# Patient Record
Sex: Male | Born: 1950 | Race: White | Hispanic: No | Smoking: Never smoker
Health system: Southern US, Community
[De-identification: ages and names within clinical notes are randomized; demographics above are authoritative.]

## PROBLEM LIST (undated history)

## (undated) DIAGNOSIS — K219 Gastro-esophageal reflux disease without esophagitis: Secondary | ICD-10-CM

## (undated) DIAGNOSIS — E785 Hyperlipidemia, unspecified: Secondary | ICD-10-CM

## (undated) DIAGNOSIS — J441 Chronic obstructive pulmonary disease with (acute) exacerbation: Secondary | ICD-10-CM

## (undated) DIAGNOSIS — J9621 Acute and chronic respiratory failure with hypoxia: Secondary | ICD-10-CM

## (undated) DIAGNOSIS — J449 Chronic obstructive pulmonary disease, unspecified: Secondary | ICD-10-CM

## (undated) DIAGNOSIS — I10 Essential (primary) hypertension: Secondary | ICD-10-CM

## (undated) DIAGNOSIS — F329 Major depressive disorder, single episode, unspecified: Secondary | ICD-10-CM

## (undated) DIAGNOSIS — F411 Generalized anxiety disorder: Secondary | ICD-10-CM

## (undated) DIAGNOSIS — D649 Anemia, unspecified: Secondary | ICD-10-CM

## (undated) DIAGNOSIS — F419 Anxiety disorder, unspecified: Secondary | ICD-10-CM

## (undated) DIAGNOSIS — N400 Enlarged prostate without lower urinary tract symptoms: Secondary | ICD-10-CM

## (undated) DIAGNOSIS — Z9911 Dependence on respirator [ventilator] status: Secondary | ICD-10-CM

---

## 2018-11-28 ENCOUNTER — Emergency Department (HOSPITAL_COMMUNITY): Payer: Medicare Other

## 2018-11-28 ENCOUNTER — Inpatient Hospital Stay (HOSPITAL_COMMUNITY)
Admission: EM | Admit: 2018-11-28 | Discharge: 2018-12-14 | DRG: 871 | Disposition: A | Discharge status: Other Acute Inpatient Hospital | Payer: Medicare Other | Source: Skilled Nursing Facility | Attending: Internal Medicine | Admitting: Internal Medicine

## 2018-11-28 DIAGNOSIS — Z885 Allergy status to narcotic agent status: Secondary | ICD-10-CM

## 2018-11-28 DIAGNOSIS — R042 Hemoptysis: Secondary | ICD-10-CM | POA: Diagnosis present

## 2018-11-28 DIAGNOSIS — Z20828 Contact with and (suspected) exposure to other viral communicable diseases: Secondary | ICD-10-CM | POA: Diagnosis present

## 2018-11-28 DIAGNOSIS — Z930 Tracheostomy status: Secondary | ICD-10-CM

## 2018-11-28 DIAGNOSIS — Z87440 Personal history of urinary (tract) infections: Secondary | ICD-10-CM

## 2018-11-28 DIAGNOSIS — I10 Essential (primary) hypertension: Secondary | ICD-10-CM | POA: Diagnosis present

## 2018-11-28 DIAGNOSIS — F419 Anxiety disorder, unspecified: Secondary | ICD-10-CM | POA: Diagnosis present

## 2018-11-28 DIAGNOSIS — Z9911 Dependence on respirator [ventilator] status: Secondary | ICD-10-CM

## 2018-11-28 DIAGNOSIS — J9601 Acute respiratory failure with hypoxia: Secondary | ICD-10-CM | POA: Diagnosis present

## 2018-11-28 DIAGNOSIS — E872 Acidosis: Secondary | ICD-10-CM | POA: Diagnosis present

## 2018-11-28 DIAGNOSIS — R059 Cough, unspecified: Secondary | ICD-10-CM

## 2018-11-28 DIAGNOSIS — Z8619 Personal history of other infectious and parasitic diseases: Secondary | ICD-10-CM

## 2018-11-28 DIAGNOSIS — R0602 Shortness of breath: Secondary | ICD-10-CM

## 2018-11-28 DIAGNOSIS — J439 Emphysema, unspecified: Secondary | ICD-10-CM | POA: Diagnosis present

## 2018-11-28 DIAGNOSIS — J9621 Acute and chronic respiratory failure with hypoxia: Secondary | ICD-10-CM | POA: Diagnosis present

## 2018-11-28 DIAGNOSIS — A419 Sepsis, unspecified organism: Principal | ICD-10-CM

## 2018-11-28 DIAGNOSIS — N400 Enlarged prostate without lower urinary tract symptoms: Secondary | ICD-10-CM | POA: Diagnosis present

## 2018-11-28 DIAGNOSIS — J9690 Respiratory failure, unspecified, unspecified whether with hypoxia or hypercapnia: Secondary | ICD-10-CM

## 2018-11-28 DIAGNOSIS — J189 Pneumonia, unspecified organism: Secondary | ICD-10-CM

## 2018-11-28 DIAGNOSIS — E785 Hyperlipidemia, unspecified: Secondary | ICD-10-CM | POA: Diagnosis present

## 2018-11-28 DIAGNOSIS — G4700 Insomnia, unspecified: Secondary | ICD-10-CM | POA: Diagnosis not present

## 2018-11-28 DIAGNOSIS — D649 Anemia, unspecified: Secondary | ICD-10-CM | POA: Diagnosis present

## 2018-11-28 DIAGNOSIS — Z888 Allergy status to other drugs, medicaments and biological substances status: Secondary | ICD-10-CM

## 2018-11-28 DIAGNOSIS — J441 Chronic obstructive pulmonary disease with (acute) exacerbation: Secondary | ICD-10-CM

## 2018-11-28 DIAGNOSIS — F329 Major depressive disorder, single episode, unspecified: Secondary | ICD-10-CM | POA: Diagnosis present

## 2018-11-28 DIAGNOSIS — R739 Hyperglycemia, unspecified: Secondary | ICD-10-CM | POA: Diagnosis not present

## 2018-11-28 DIAGNOSIS — R05 Cough: Secondary | ICD-10-CM

## 2018-11-28 DIAGNOSIS — Z8701 Personal history of pneumonia (recurrent): Secondary | ICD-10-CM

## 2018-11-28 DIAGNOSIS — M21372 Foot drop, left foot: Secondary | ICD-10-CM

## 2018-11-28 DIAGNOSIS — L8990 Pressure ulcer of unspecified site, unspecified stage: Secondary | ICD-10-CM

## 2018-11-28 DIAGNOSIS — Z931 Gastrostomy status: Secondary | ICD-10-CM

## 2018-11-28 DIAGNOSIS — J9620 Acute and chronic respiratory failure, unspecified whether with hypoxia or hypercapnia: Secondary | ICD-10-CM

## 2018-11-28 DIAGNOSIS — K219 Gastro-esophageal reflux disease without esophagitis: Secondary | ICD-10-CM | POA: Diagnosis present

## 2018-11-28 DIAGNOSIS — Z7682 Awaiting organ transplant status: Secondary | ICD-10-CM

## 2018-11-28 DIAGNOSIS — Z79899 Other long term (current) drug therapy: Secondary | ICD-10-CM

## 2018-11-28 HISTORY — DX: Chronic obstructive pulmonary disease, unspecified: J44.9

## 2018-11-28 HISTORY — DX: Hyperlipidemia, unspecified: E78.5

## 2018-11-28 HISTORY — DX: Major depressive disorder, single episode, unspecified: F32.9

## 2018-11-28 HISTORY — DX: Dependence on respirator (ventilator) status: Z99.11

## 2018-11-28 HISTORY — DX: Essential (primary) hypertension: I10

## 2018-11-28 HISTORY — DX: Gastro-esophageal reflux disease without esophagitis: K21.9

## 2018-11-28 HISTORY — DX: Anemia, unspecified: D64.9

## 2018-11-28 HISTORY — DX: Anxiety disorder, unspecified: F41.9

## 2018-11-28 HISTORY — DX: Benign prostatic hyperplasia without lower urinary tract symptoms: N40.0

## 2018-11-28 MED ORDER — METHYLPREDNISOLONE SODIUM SUCC 125 MG IJ SOLR
125.0000 mg | Freq: Once | INTRAMUSCULAR | Status: AC
  Administered 2018-11-29: 125 mg via INTRAVENOUS
  Filled 2018-11-28: qty 2

## 2018-11-28 MED ORDER — NITROGLYCERIN 0.4 MG SL SUBL
0.4000 mg | SUBLINGUAL_TABLET | SUBLINGUAL | Status: DC | PRN
  Administered 2018-12-12: 0.4 mg via SUBLINGUAL
  Filled 2018-11-28 (×2): qty 1

## 2018-11-28 MED ORDER — ALBUTEROL (5 MG/ML) CONTINUOUS INHALATION SOLN
10.0000 mg/h | INHALATION_SOLUTION | RESPIRATORY_TRACT | Status: DC
  Administered 2018-11-29: 10 mg/h via RESPIRATORY_TRACT
  Filled 2018-11-28 (×2): qty 20

## 2018-11-28 MED ORDER — IPRATROPIUM BROMIDE 0.02 % IN SOLN
0.5000 mg | Freq: Once | RESPIRATORY_TRACT | Status: AC
  Administered 2018-11-29: 0.5 mg via RESPIRATORY_TRACT
  Filled 2018-11-28: qty 2.5

## 2018-11-28 MED ORDER — FUROSEMIDE 10 MG/ML IJ SOLN
20.0000 mg | Freq: Once | INTRAMUSCULAR | Status: AC
  Administered 2018-11-29: 20 mg via INTRAVENOUS
  Filled 2018-11-28: qty 2

## 2018-11-28 MED ORDER — MAGNESIUM SULFATE 2 GM/50ML IV SOLN
2.0000 g | Freq: Once | INTRAVENOUS | Status: AC
  Administered 2018-11-29: 2 g via INTRAVENOUS
  Filled 2018-11-28: qty 50

## 2018-11-28 NOTE — ED Provider Notes (Signed)
[facility] EMERGENCY DEPARTMENT Provider Note  CSN: [id number] Arrival date & time: 11/28/18 2335  Chief Complaint(s) Respiratory Distress  HPI Robert Pham is a 68 y.o. male with extensive past medical history including COPD who is currently vent dependent through a trach, currently living at [facility] and currently being considered for lung transplant by [facility] who presents with respiratory distress and hemoptysis.  Shortness of breath began earlier today and gradually worsened throughout the day.  Patient began having frothy red sputum.  Patient also noted to be tachycardic.  Patient complains of shortness of breath.  He is also noted to have bilateral lower extremity edema which he reports is been there for 2 weeks.  He denies any history of heart failure.  On review of records from outside hospital notes, it appears that patient was treated for pseudomonal pneumonia back in March.  Patient also noted to have Candida urinary tract infection that was treated with fluconazole.  He was discharged from outside hospital and sent to [facility] 2 days ago.   Remainder of history, ROS, and physical exam limited due to patient's condition (RESPIRATORY DISTRESS AND ACUITY). Additional information was obtained from EMS, PT, AND OSH RECORDS.   Level V Caveat.    HPI  Past Medical History Past Medical History:  Diagnosis Date  . Anemia   . Anxiety   . BPH (benign prostatic hyperplasia)   . COPD (chronic obstructive pulmonary disease) (HCC)   . GERD (gastroesophageal reflux disease)   . Hyperlipidemia   . Hypertension   . Major depressive disorder   . Ventilator dependent [facility])    Patient Active Problem List   Diagnosis Date Noted  . Acute respiratory failure with hypoxia (HCC) 11/29/2018   Home Medication(s) Prior to Admission medications   Not on File                      Past Surgical History History reviewed. No pertinent surgical history. Family History No family history on file.  Social History Social History   Tobacco Use  . Smoking status: Never Smoker  . Smokeless tobacco: Never Used  Substance Use Topics  . Alcohol use: Not Currently  . Drug use: Not Currently   Allergies Codeine and Klonopin [clonazepam]  Review of Systems Review of Systems  Unable to perform ROS: Acuity of condition    Physical Exam Vital Signs  I have reviewed the triage vital signs BP (!) 125/95   Pulse (!) 126   Temp (!) 96.6 F (35.9 C) (Temporal)   Resp (!) 21   Ht 5\' 5"  (1.651 m)   SpO2 100%   Physical Exam Vitals signs reviewed.  Constitutional:      General: He is in acute distress.     Appearance: He is well-developed. He is not diaphoretic.  HENT:     Head: Normocephalic and atraumatic.      Nose: Nose normal.  Eyes:     General: No scleral icterus.       Right eye: No discharge.        Left eye: No discharge.     Conjunctiva/sclera: Conjunctivae normal.     Pupils: Pupils are equal, round, and reactive to light.  Neck:     Musculoskeletal: Normal range of motion and neck supple.  Cardiovascular:     Rate and Rhythm: Regular rhythm. Tachycardia present.     Heart sounds: No murmur. No friction rub. No gallop.   Pulmonary:  Effort: Tachypnea, accessory muscle usage, respiratory distress and retractions present.     Breath sounds: Decreased air movement present. No stridor. Examination of the right-middle field reveals rales. Examination of the left-middle field reveals rales. Examination of the right-lower field reveals rales. Examination of the left-lower field reveals rales. Wheezing (faint insp and exp) and rales present.  Abdominal:     General: There is no distension.     Palpations: Abdomen is soft.     Tenderness: There is no abdominal tenderness.  Musculoskeletal:        General: No tenderness.     Right  lower leg: 1+ Pitting Edema present.     Left lower leg: 1+ Pitting Edema present.  Skin:    General: Skin is warm and dry.     Findings: Erythema present. No rash.       Neurological:     Mental Status: He is alert and oriented to person, place, and time.     ED Results and Treatments Labs (all labs ordered are listed, but only abnormal results are displayed) Labs Reviewed  COMPREHENSIVE METABOLIC PANEL - Abnormal; Notable for the following components:      Result Value   Glucose, Bld 136 (*)    Creatinine, Ser 0.51 (*)    All other components within normal limits  CBC WITH DIFFERENTIAL/PLATELET - Abnormal; Notable for the following components:   WBC 16.6 (*)    RBC 3.83 (*)    Hemoglobin 10.3 (*)    HCT 35.3 (*)    MCHC 29.2 (*)    Platelets 460 (*)    Neutro Abs 13.3 (*)    Monocytes Absolute 1.8 (*)    Abs Immature Granulocytes 0.10 (*)    All other components within normal limits  POCT I-STAT 7, (LYTES, BLD GAS, ICA,H+H) - Abnormal; Notable for the following components:   pCO2 arterial 58.0 (*)    pO2, Arterial 436.0 (*)    Bicarbonate 33.1 (*)    TCO2 35 (*)    Acid-Base Excess 6.0 (*)    HCT 30.0 (*)    Hemoglobin 10.2 (*)    All other components within normal limits  SARS CORONAVIRUS 2 (HOSPITAL ORDER, PERFORMED IN  HOSPITAL LAB)  CULTURE, BLOOD (ROUTINE X 2)  CULTURE, BLOOD (ROUTINE X 2)  LACTIC ACID, PLASMA  BRAIN NATRIURETIC PEPTIDE  LACTIC ACID, PLASMA  URINALYSIS, ROUTINE W REFLEX MICROSCOPIC  I-STAT ARTERIAL BLOOD GAS, ED                                                                                                                         EKG  EKG Interpretation  Date/Time:  Monday Nov 28 2018 23:54:48 EDT Ventricular Rate:  127 PR Interval:    QRS Duration: 106 QT Interval:  302 QTC Calculation: 439 R Axis:   102 Text Interpretation:  Sinus tachycardia Ventricular premature complex Consider right atrial enlargement Left posterior  fascicular block Anterior infarct, old Minimal ST depression,  inferior leads Artifact in lead(s) I II III aVR aVL aVF V1 V2 V3 V4 V5 V6 NO STEMI. No old tracing to compare Confirmed by Drema PryCardama, Junelle Hashemi [phone]([id number]) on 11/29/2018 12:26:05 AM      Radiology Dg Chest Port 1 View  Result Date: 11/29/2018 CLINICAL DATA:  Respiratory distress EXAM: PORTABLE CHEST 1 VIEW COMPARISON:  None. FINDINGS: Cardiac shadows within normal limits. Tracheostomy tube is noted in satisfactory position. Patchy infiltrates are noted in the bases bilaterally. No sizable effusion is seen. Hyperinflation consistent with COPD is noted. No bony abnormality is seen. IMPRESSION: Mild patchy bibasilar infiltrates. COPD. Electronically Signed   By: Alcide CleverMark  Lukens M.D.   On: 11/29/2018 00:25   Pertinent labs & imaging results that were available during my care of the patient were reviewed by me and considered in my medical decision making (see chart for details).  Medications Ordered in ED Medications  nitroGLYCERIN (NITROSTAT) SL tablet 0.4 mg (has no administration in time range)  albuterol (PROVENTIL,VENTOLIN) solution continuous neb (0 mg/hr Nebulization Stopped 11/29/18 0127)  vancomycin (VANCOCIN) IVPB 1000 mg/200 mL premix (has no administration in time range)  ceFEPIme (MAXIPIME) 2 g in sodium chloride 0.9 % 100 mL IVPB (has no administration in time range)  albuterol (PROVENTIL,VENTOLIN) solution continuous neb (0 mg/hr Nebulization Stopped 11/29/18 0127)  furosemide (LASIX) injection 20 mg (20 mg Intravenous Given 11/29/18 0001)  ipratropium (ATROVENT) nebulizer solution 0.5 mg (0.5 mg Nebulization Given 11/29/18 0002)  magnesium sulfate IVPB 2 g 50 mL (0 g Intravenous Stopped 11/29/18 0127)  methylPREDNISolone sodium succinate (SOLU-MEDROL) 125 mg/2 mL injection 125 mg (125 mg Intravenous Given 11/29/18 0004)  ipratropium (ATROVENT) nebulizer solution 0.5 mg ( Nebulization Canceled Entry 11/29/18 0109)                                                                                                                                     Procedures .Critical Care Performed by: Nira Connardama, Jarron Curley Eduardo, MD Authorized by: Nira Connardama, Mikenna Bunkley Eduardo, MD   Critical care provider statement:    Critical care time (minutes):  60   Critical care was necessary to treat or prevent imminent or life-threatening deterioration of the following conditions:  Respiratory failure and sepsis   Critical care was time spent personally by me on the following activities:  Discussions with consultants, evaluation of patient's response to treatment, examination of patient, ordering and performing treatments and interventions, ordering and review of laboratory studies, ordering and review of radiographic studies, pulse oximetry, re-evaluation of patient's condition, obtaining history from patient or surrogate and review of old charts    (including critical care time)  Medical Decision Making / ED Course I have reviewed the nursing notes for this encounter and the patient's prior records (if available in EHR or on provided paperwork).    Patient presents in respiratory distress.  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  He is currently  afebrile but tachycardic and hypertensive.  Lungs with bibasilar rales, decreased air movement and faint wheezing.  Appears to be COPD exacerbation.  Patient has evidence of volume overload with bilateral lower extremity peripheral edema which may be dependent versus new onset heart failure.  Patient also noted to have erythema surrounding the PICC line.  Chest x-ray notable for hyperinflation with bilateral basilar infiltrates.   Septic work-up was initiated.  Patient was started on empiric antibiotics.  He was also given nitroglycerin and Lasix.  Also given continuous duo nebs x2, Solu-Medrol, and magnesium.  ABG was reassuring with compensated respiratory acidosis.  Oxygenating well and FiO2  titrated down.  Vent adjustments were made at bedside by myself and respiratory tech.   After breathing treatments and vent adjustment.  Patient is work of breathing improved.  COVID test was negative.  PICC line will be removed after blood culture obtained from the PICC line.  We will also need to obtain urinalysis given the patient's prior history of candidal urinary tract infection.      Final Clinical Impression(s) / ED Diagnoses Final diagnoses:  COPD exacerbation (HCC)  HCAP (healthcare-associated pneumonia)  Sepsis with acute hypercapnic respiratory failure without septic shock, due to unspecified organism [facility])      This chart was dictated using voice recognition software.  Despite best efforts to proofread,  errors can occur which can change the documentation meaning.   Nira Conn, MD 11/29/18 [phone]

## 2018-11-29 ENCOUNTER — Other Ambulatory Visit: Payer: Self-pay

## 2018-11-29 ENCOUNTER — Inpatient Hospital Stay (HOSPITAL_COMMUNITY): Payer: Medicare Other

## 2018-11-29 ENCOUNTER — Encounter (HOSPITAL_COMMUNITY): Payer: Self-pay | Admitting: Emergency Medicine

## 2018-11-29 DIAGNOSIS — J441 Chronic obstructive pulmonary disease with (acute) exacerbation: Secondary | ICD-10-CM | POA: Diagnosis not present

## 2018-11-29 DIAGNOSIS — D649 Anemia, unspecified: Secondary | ICD-10-CM | POA: Diagnosis present

## 2018-11-29 DIAGNOSIS — Z87440 Personal history of urinary (tract) infections: Secondary | ICD-10-CM | POA: Diagnosis not present

## 2018-11-29 DIAGNOSIS — R042 Hemoptysis: Secondary | ICD-10-CM | POA: Diagnosis present

## 2018-11-29 DIAGNOSIS — R609 Edema, unspecified: Secondary | ICD-10-CM | POA: Diagnosis not present

## 2018-11-29 DIAGNOSIS — E872 Acidosis: Secondary | ICD-10-CM | POA: Diagnosis present

## 2018-11-29 DIAGNOSIS — M21372 Foot drop, left foot: Secondary | ICD-10-CM | POA: Diagnosis present

## 2018-11-29 DIAGNOSIS — F411 Generalized anxiety disorder: Secondary | ICD-10-CM | POA: Diagnosis not present

## 2018-11-29 DIAGNOSIS — Z7682 Awaiting organ transplant status: Secondary | ICD-10-CM | POA: Diagnosis not present

## 2018-11-29 DIAGNOSIS — Z931 Gastrostomy status: Secondary | ICD-10-CM | POA: Diagnosis not present

## 2018-11-29 DIAGNOSIS — Z79899 Other long term (current) drug therapy: Secondary | ICD-10-CM | POA: Diagnosis not present

## 2018-11-29 DIAGNOSIS — R0602 Shortness of breath: Secondary | ICD-10-CM

## 2018-11-29 DIAGNOSIS — J439 Emphysema, unspecified: Secondary | ICD-10-CM | POA: Diagnosis present

## 2018-11-29 DIAGNOSIS — A419 Sepsis, unspecified organism: Secondary | ICD-10-CM | POA: Diagnosis present

## 2018-11-29 DIAGNOSIS — Z8701 Personal history of pneumonia (recurrent): Secondary | ICD-10-CM | POA: Diagnosis not present

## 2018-11-29 DIAGNOSIS — Z8619 Personal history of other infectious and parasitic diseases: Secondary | ICD-10-CM | POA: Diagnosis not present

## 2018-11-29 DIAGNOSIS — Z9911 Dependence on respirator [ventilator] status: Secondary | ICD-10-CM | POA: Diagnosis not present

## 2018-11-29 DIAGNOSIS — Z930 Tracheostomy status: Secondary | ICD-10-CM | POA: Diagnosis not present

## 2018-11-29 DIAGNOSIS — I10 Essential (primary) hypertension: Secondary | ICD-10-CM | POA: Diagnosis present

## 2018-11-29 DIAGNOSIS — F419 Anxiety disorder, unspecified: Secondary | ICD-10-CM | POA: Diagnosis present

## 2018-11-29 DIAGNOSIS — N400 Enlarged prostate without lower urinary tract symptoms: Secondary | ICD-10-CM | POA: Diagnosis present

## 2018-11-29 DIAGNOSIS — J9601 Acute respiratory failure with hypoxia: Secondary | ICD-10-CM | POA: Diagnosis not present

## 2018-11-29 DIAGNOSIS — F329 Major depressive disorder, single episode, unspecified: Secondary | ICD-10-CM | POA: Diagnosis present

## 2018-11-29 DIAGNOSIS — Z20828 Contact with and (suspected) exposure to other viral communicable diseases: Secondary | ICD-10-CM | POA: Diagnosis present

## 2018-11-29 DIAGNOSIS — J449 Chronic obstructive pulmonary disease, unspecified: Secondary | ICD-10-CM | POA: Diagnosis not present

## 2018-11-29 DIAGNOSIS — G4700 Insomnia, unspecified: Secondary | ICD-10-CM | POA: Diagnosis not present

## 2018-11-29 DIAGNOSIS — J9621 Acute and chronic respiratory failure with hypoxia: Secondary | ICD-10-CM | POA: Diagnosis present

## 2018-11-29 DIAGNOSIS — R739 Hyperglycemia, unspecified: Secondary | ICD-10-CM | POA: Diagnosis not present

## 2018-11-29 DIAGNOSIS — E785 Hyperlipidemia, unspecified: Secondary | ICD-10-CM | POA: Diagnosis present

## 2018-11-29 DIAGNOSIS — R7881 Bacteremia: Secondary | ICD-10-CM | POA: Diagnosis not present

## 2018-11-29 DIAGNOSIS — K219 Gastro-esophageal reflux disease without esophagitis: Secondary | ICD-10-CM | POA: Diagnosis present

## 2018-11-29 LAB — CBC WITH DIFFERENTIAL/PLATELET
Abs Immature Granulocytes: 0.1 10*3/uL — ABNORMAL HIGH (ref 0.00–0.07)
Basophils Absolute: 0.1 10*3/uL (ref 0.0–0.1)
Basophils Relative: 1 %
Eosinophils Absolute: 0.4 10*3/uL (ref 0.0–0.5)
Eosinophils Relative: 3 %
HCT: 35.3 % — ABNORMAL LOW (ref 39.0–52.0)
Hemoglobin: 10.3 g/dL — ABNORMAL LOW (ref 13.0–17.0)
Immature Granulocytes: 1 %
Lymphocytes Relative: 5 %
Lymphs Abs: 0.8 10*3/uL (ref 0.7–4.0)
MCH: 26.9 pg (ref 26.0–34.0)
MCHC: 29.2 g/dL — ABNORMAL LOW (ref 30.0–36.0)
MCV: 92.2 fL (ref 80.0–100.0)
Monocytes Absolute: 1.8 10*3/uL — ABNORMAL HIGH (ref 0.1–1.0)
Monocytes Relative: 11 %
Neutro Abs: 13.3 10*3/uL — ABNORMAL HIGH (ref 1.7–7.7)
Neutrophils Relative %: 79 %
Platelets: 460 10*3/uL — ABNORMAL HIGH (ref 150–400)
RBC: 3.83 MIL/uL — ABNORMAL LOW (ref 4.22–5.81)
RDW: 14.7 % (ref 11.5–15.5)
WBC: 16.6 10*3/uL — ABNORMAL HIGH (ref 4.0–10.5)
nRBC: 0 % (ref 0.0–0.2)

## 2018-11-29 LAB — RESPIRATORY PANEL BY PCR

## 2018-11-29 LAB — BASIC METABOLIC PANEL
Anion gap: 12 (ref 5–15)
BUN: 25 mg/dL — ABNORMAL HIGH (ref 8–23)
CO2: 27 mmol/L (ref 22–32)
Calcium: 9.7 mg/dL (ref 8.9–10.3)
Chloride: 100 mmol/L (ref 98–111)
Creatinine, Ser: 0.76 mg/dL (ref 0.61–1.24)
GFR calc Af Amer: >60 mL/min (ref >60)
GFR calc non Af Amer: >60 mL/min (ref >60)
Glucose, Bld: 179 mg/dL — ABNORMAL HIGH (ref 70–99)
Potassium: 4.1 mmol/L (ref 3.5–5.1)
Sodium: 139 mmol/L (ref 135–145)

## 2018-11-29 LAB — ECHOCARDIOGRAM COMPLETE
Height: 65 in
Weight: 2000 oz

## 2018-11-29 LAB — POCT I-STAT 7, (LYTES, BLD GAS, ICA,H+H)
Acid-Base Excess: 1 mmol/L (ref 0.0–2.0)
Acid-Base Excess: 6 mmol/L — ABNORMAL HIGH (ref 0.0–2.0)
Bicarbonate: 27.5 mmol/L (ref 20.0–28.0)
Bicarbonate: 33.1 mmol/L — ABNORMAL HIGH (ref 20.0–28.0)
Calcium, Ion: 1.28 mmol/L (ref 1.15–1.40)
Calcium, Ion: 1.32 mmol/L (ref 1.15–1.40)
HCT: 30 % — ABNORMAL LOW (ref 39.0–52.0)
HCT: 30 % — ABNORMAL LOW (ref 39.0–52.0)
Hemoglobin: 10.2 g/dL — ABNORMAL LOW (ref 13.0–17.0)
Hemoglobin: 10.2 g/dL — ABNORMAL LOW (ref 13.0–17.0)
O2 Saturation: 100 %
O2 Saturation: 100 %
Patient temperature: 96.6
Patient temperature: 98.6
Potassium: 3.8 mmol/L (ref 3.5–5.1)
Potassium: 4.7 mmol/L (ref 3.5–5.1)
Sodium: 137 mmol/L (ref 135–145)
Sodium: 139 mmol/L (ref 135–145)
TCO2: 29 mmol/L (ref 22–32)
TCO2: 35 mmol/L — ABNORMAL HIGH (ref 22–32)
pCO2 arterial: 51.2 mmHg — ABNORMAL HIGH (ref 32.0–48.0)
pCO2 arterial: 58 mmHg — ABNORMAL HIGH (ref 32.0–48.0)
pH, Arterial: 7.338 — ABNORMAL LOW (ref 7.350–7.450)
pH, Arterial: 7.359 (ref 7.350–7.450)
pO2, Arterial: 198 mmHg — ABNORMAL HIGH (ref 83.0–108.0)
pO2, Arterial: 436 mmHg — ABNORMAL HIGH (ref 83.0–108.0)

## 2018-11-29 LAB — CBC
HCT: 30.7 % — ABNORMAL LOW (ref 39.0–52.0)
Hemoglobin: 9 g/dL — ABNORMAL LOW (ref 13.0–17.0)
MCH: 26.9 pg (ref 26.0–34.0)
MCHC: 29.3 g/dL — ABNORMAL LOW (ref 30.0–36.0)
MCV: 91.6 fL (ref 80.0–100.0)
Platelets: 386 10*3/uL (ref 150–400)
RBC: 3.35 MIL/uL — ABNORMAL LOW (ref 4.22–5.81)
RDW: 14.9 % (ref 11.5–15.5)
WBC: 14.8 10*3/uL — ABNORMAL HIGH (ref 4.0–10.5)
nRBC: 0 % (ref 0.0–0.2)

## 2018-11-29 LAB — MRSA PCR SCREENING: MRSA by PCR: NEGATIVE

## 2018-11-29 LAB — COMPREHENSIVE METABOLIC PANEL
ALT: 19 U/L (ref 0–44)
AST: 18 U/L (ref 15–41)
Albumin: 3.5 g/dL (ref 3.5–5.0)
Alkaline Phosphatase: 84 U/L (ref 38–126)
Anion gap: 9 (ref 5–15)
BUN: 21 mg/dL (ref 8–23)
CO2: 30 mmol/L (ref 22–32)
Calcium: 9.7 mg/dL (ref 8.9–10.3)
Chloride: 101 mmol/L (ref 98–111)
Creatinine, Ser: 0.51 mg/dL — ABNORMAL LOW (ref 0.61–1.24)
GFR calc Af Amer: >60 mL/min (ref >60)
GFR calc non Af Amer: >60 mL/min (ref >60)
Glucose, Bld: 136 mg/dL — ABNORMAL HIGH (ref 70–99)
Potassium: 4.7 mmol/L (ref 3.5–5.1)
Sodium: 140 mmol/L (ref 135–145)
Total Bilirubin: 0.3 mg/dL (ref 0.3–1.2)
Total Protein: 7.2 g/dL (ref 6.5–8.1)

## 2018-11-29 LAB — PHOSPHORUS: Phosphorus: 3.9 mg/dL (ref 2.5–4.6)

## 2018-11-29 LAB — URINALYSIS, MICROSCOPIC (REFLEX)
Squamous Epithelial / HPF: NONE SEEN (ref 0–5)
WBC, UA: >50 WBC/hpf (ref 0–5)

## 2018-11-29 LAB — MAGNESIUM: Magnesium: 2.3 mg/dL (ref 1.7–2.4)

## 2018-11-29 LAB — URINALYSIS, ROUTINE W REFLEX MICROSCOPIC
Bilirubin Urine: NEGATIVE
Glucose, UA: NEGATIVE mg/dL
Ketones, ur: NEGATIVE mg/dL
Nitrite: NEGATIVE
Protein, ur: NEGATIVE mg/dL
Specific Gravity, Urine: 1.025 (ref 1.005–1.030)
pH: 5.5 (ref 5.0–8.0)

## 2018-11-29 LAB — TYPE AND SCREEN
ABO/RH(D): A POS
Antibody Screen: NEGATIVE

## 2018-11-29 LAB — GLUCOSE, CAPILLARY
Glucose-Capillary: 118 mg/dL — ABNORMAL HIGH (ref 70–99)
Glucose-Capillary: 124 mg/dL — ABNORMAL HIGH (ref 70–99)
Glucose-Capillary: 143 mg/dL — ABNORMAL HIGH (ref 70–99)
Glucose-Capillary: 158 mg/dL — ABNORMAL HIGH (ref 70–99)

## 2018-11-29 LAB — BRAIN NATRIURETIC PEPTIDE: B Natriuretic Peptide: 75.2 pg/mL (ref 0.0–100.0)

## 2018-11-29 LAB — HIV ANTIBODY (ROUTINE TESTING W REFLEX): HIV Screen 4th Generation wRfx: NONREACTIVE

## 2018-11-29 LAB — CBG MONITORING, ED: Glucose-Capillary: 138 mg/dL — ABNORMAL HIGH (ref 70–99)

## 2018-11-29 LAB — LACTIC ACID, PLASMA
Lactic Acid, Venous: 1.3 mmol/L (ref 0.5–1.9)
Lactic Acid, Venous: 2.4 mmol/L (ref 0.5–1.9)

## 2018-11-29 LAB — SARS CORONAVIRUS 2 BY RT PCR (HOSPITAL ORDER, PERFORMED IN ~~LOC~~ HOSPITAL LAB): SARS Coronavirus 2: NEGATIVE

## 2018-11-29 LAB — ABO/RH: ABO/RH(D): A POS

## 2018-11-29 MED ORDER — VANCOMYCIN HCL IN DEXTROSE 1-5 GM/200ML-% IV SOLN
1000.0000 mg | Freq: Two times a day (BID) | INTRAVENOUS | Status: DC
  Administered 2018-11-29 – 2018-12-01 (×4): 1000 mg via INTRAVENOUS
  Filled 2018-11-29 (×5): qty 200

## 2018-11-29 MED ORDER — LISINOPRIL 10 MG PO TABS
10.0000 mg | ORAL_TABLET | Freq: Every day | ORAL | Status: DC
  Administered 2018-11-29: 10 mg via ORAL
  Filled 2018-11-29: qty 1

## 2018-11-29 MED ORDER — IPRATROPIUM BROMIDE 0.02 % IN SOLN
0.5000 mg | Freq: Once | RESPIRATORY_TRACT | Status: AC
  Administered 2018-11-29: 0.5 mg via RESPIRATORY_TRACT

## 2018-11-29 MED ORDER — GUAIFENESIN-DM 100-10 MG/5ML PO SYRP
15.0000 mL | ORAL_SOLUTION | ORAL | Status: DC | PRN
  Administered 2018-11-29 – 2018-12-14 (×19): 15 mL
  Filled 2018-11-29 (×20): qty 15

## 2018-11-29 MED ORDER — IPRATROPIUM BROMIDE 0.02 % IN SOLN
RESPIRATORY_TRACT | Status: AC
  Filled 2018-11-29: qty 2.5

## 2018-11-29 MED ORDER — QUETIAPINE FUMARATE 25 MG PO TABS
25.0000 mg | ORAL_TABLET | Freq: Every day | ORAL | Status: DC
  Filled 2018-11-29: qty 1

## 2018-11-29 MED ORDER — VANCOMYCIN HCL IN DEXTROSE 1-5 GM/200ML-% IV SOLN
1000.0000 mg | Freq: Once | INTRAVENOUS | Status: AC
  Administered 2018-11-29: 1000 mg via INTRAVENOUS
  Filled 2018-11-29: qty 200

## 2018-11-29 MED ORDER — ALBUTEROL SULFATE (2.5 MG/3ML) 0.083% IN NEBU
2.5000 mg | INHALATION_SOLUTION | RESPIRATORY_TRACT | Status: DC | PRN
  Administered 2018-11-30 – 2018-12-13 (×7): 2.5 mg via RESPIRATORY_TRACT
  Filled 2018-11-29 (×8): qty 3

## 2018-11-29 MED ORDER — ACETAMINOPHEN 325 MG PO TABS
650.0000 mg | ORAL_TABLET | Freq: Four times a day (QID) | ORAL | Status: DC | PRN
  Administered 2018-11-30 – 2018-12-13 (×13): 650 mg via ORAL
  Filled 2018-11-29 (×15): qty 2

## 2018-11-29 MED ORDER — DEXMEDETOMIDINE HCL IN NACL 200 MCG/50ML IV SOLN
0.4000 ug/kg/h | INTRAVENOUS | Status: DC
  Administered 2018-11-29 (×2): 0.4 ug/kg/h via INTRAVENOUS
  Administered 2018-11-29: 0.5 ug/kg/h via INTRAVENOUS
  Administered 2018-11-30 (×2): 0.4 ug/kg/h via INTRAVENOUS
  Filled 2018-11-29 (×2): qty 50

## 2018-11-29 MED ORDER — JEVITY 1.2 CAL PO LIQD
1000.0000 mL | ORAL | Status: DC
  Administered 2018-11-29: 12:00:00
  Administered 2018-11-30: 60 mL
  Administered 2018-12-02 – 2018-12-08 (×7): 1000 mL
  Administered 2018-12-10: 60 mL/h
  Administered 2018-12-12 – 2018-12-13 (×2): 1000 mL
  Filled 2018-11-29 (×25): qty 1000

## 2018-11-29 MED ORDER — SODIUM CHLORIDE 0.9 % IV SOLN
2.0000 g | Freq: Three times a day (TID) | INTRAVENOUS | Status: AC
  Administered 2018-11-29 – 2018-12-02 (×11): 2 g via INTRAVENOUS
  Filled 2018-11-29 (×11): qty 2

## 2018-11-29 MED ORDER — SODIUM CHLORIDE 0.9 % IV SOLN
2.0000 g | Freq: Once | INTRAVENOUS | Status: AC
  Administered 2018-11-29: 2 g via INTRAVENOUS
  Filled 2018-11-29: qty 2

## 2018-11-29 MED ORDER — IPRATROPIUM-ALBUTEROL 0.5-2.5 (3) MG/3ML IN SOLN
3.0000 mL | Freq: Four times a day (QID) | RESPIRATORY_TRACT | Status: DC
  Administered 2018-11-29 – 2018-12-07 (×33): 3 mL via RESPIRATORY_TRACT
  Filled 2018-11-29 (×34): qty 3

## 2018-11-29 MED ORDER — ACETAMINOPHEN 650 MG RE SUPP: 650.0000 mg | Freq: Four times a day (QID) | RECTAL | Status: DC | PRN

## 2018-11-29 MED ORDER — QUETIAPINE FUMARATE 25 MG PO TABS
25.0000 mg | ORAL_TABLET | Freq: Every day | ORAL | Status: DC
  Filled 2018-11-29 (×2): qty 1

## 2018-11-29 MED ORDER — ONDANSETRON HCL 4 MG/2ML IJ SOLN
4.0000 mg | Freq: Four times a day (QID) | INTRAMUSCULAR | Status: DC | PRN
  Administered 2018-12-02 – 2018-12-14 (×3): 4 mg via INTRAVENOUS
  Filled 2018-11-29 (×5): qty 2

## 2018-11-29 MED ORDER — PRO-STAT SUGAR FREE PO LIQD
30.0000 mL | Freq: Every day | ORAL | Status: DC
  Administered 2018-11-29: 30 mL
  Filled 2018-11-29: qty 30

## 2018-11-29 MED ORDER — CHLORHEXIDINE GLUCONATE 0.12% ORAL RINSE (MEDLINE KIT)
15.0000 mL | Freq: Two times a day (BID) | OROMUCOSAL | Status: DC
  Administered 2018-11-29 – 2018-12-14 (×25): 15 mL via OROMUCOSAL

## 2018-11-29 MED ORDER — LISINOPRIL 10 MG PO TABS
10.0000 mg | ORAL_TABLET | Freq: Every day | ORAL | Status: DC
  Administered 2018-11-30 – 2018-12-08 (×9): 10 mg
  Filled 2018-11-29 (×9): qty 1

## 2018-11-29 MED ORDER — METHYLPREDNISOLONE SODIUM SUCC 40 MG IJ SOLR
40.0000 mg | Freq: Two times a day (BID) | INTRAMUSCULAR | Status: DC
  Administered 2018-11-29 – 2018-12-01 (×6): 40 mg via INTRAVENOUS
  Filled 2018-11-29 (×8): qty 1

## 2018-11-29 MED ORDER — ALBUTEROL (5 MG/ML) CONTINUOUS INHALATION SOLN
10.0000 mg/h | INHALATION_SOLUTION | RESPIRATORY_TRACT | Status: DC
  Administered 2018-11-29: 10 mg/h via RESPIRATORY_TRACT

## 2018-11-29 MED ORDER — ORAL CARE MOUTH RINSE
15.0000 mL | OROMUCOSAL | Status: DC
  Administered 2018-11-29 – 2018-12-14 (×78): 15 mL via OROMUCOSAL

## 2018-11-29 MED ORDER — ONDANSETRON HCL 4 MG PO TABS: 4.0000 mg | ORAL_TABLET | Freq: Four times a day (QID) | ORAL | Status: DC | PRN

## 2018-11-29 MED ORDER — PERFLUTREN LIPID MICROSPHERE
1.0000 mL | INTRAVENOUS | Status: AC | PRN
  Administered 2018-11-29: 17:00:00 2 mL via INTRAVENOUS
  Filled 2018-11-29: qty 10

## 2018-11-29 MED ORDER — PANTOPRAZOLE SODIUM 40 MG IV SOLR
40.0000 mg | Freq: Every day | INTRAVENOUS | Status: DC
  Administered 2018-11-29 – 2018-12-04 (×6): 40 mg via INTRAVENOUS
  Filled 2018-11-29 (×6): qty 40

## 2018-11-29 NOTE — Progress Notes (Signed)
  Echocardiogram 2D Echocardiogram has been performed.  Robert Pham 11/29/2018, 5:06 PM

## 2018-11-29 NOTE — Progress Notes (Signed)
Initial Nutrition Assessment  INTERVENTION:   -Continue Jevity 1.2 @ 60 ml/hr via PEG. -Provide 30 ml Prostat daily -Recommend free water flushes of 150 ml QID -This will provide 1828 kcal, 94g protein and 1762 ml H2O.  NUTRITION DIAGNOSIS:   Inadequate oral intake related to (chronic trach and vent dependent) as evidenced by NPO status.  GOAL:   Patient will meet greater than or equal to 90% of their needs  MONITOR:   Vent status, Labs, Weight trends, I & O's, TF tolerance  REASON FOR ASSESSMENT:   Consult Enteral/tube feeding initiation and management  ASSESSMENT:   68 y.o. male with history of COPD with trach PEG and vent dependent presently on transplant list at [facility], hypertension presents to the ER with complaints of increasing shortness of breath over the last 3 days and has been having increasing bloody discharge over the last 1 to 2 weeks.   **RD working remotely**  Patient admitted from [facility], on chronic trach and vent dependent. Pt is nonverbal and unable to give history. Per chart review, trach was most likely placed sometime in March 2020. Pt was on the lung transplant list at [facility].   Per home meds list, PTA pt was receiving IsoSource @ 60 ml/hr via PEG which provided 2160 kcal, 97g protein and 1100 ml H2O. Isosource not available on our hospital formulary so was substituted with Jevity 1.2, now running at 60 ml/hr. Received consult from MD via request. Will add 30 ml Prostat daily to meet 100% of pt's estimated needs.   Per care everywhere, pt weighed 125 lb at [facility] on 1/23 which is consistent with weight now.   Labs reviewed. Medications: IV Mg sulfate   NUTRITION - FOCUSED PHYSICAL EXAM:  Unable to perform per department requirements to work remotely.  Diet Order:   Diet Order            Diet NPO time specified  Diet effective now              EDUCATION NEEDS:   Not appropriate for education at this time  Skin:  Skin Assessment: Reviewed RN  Assessment  Last BM:  PTA  Height:   Ht Readings from Last 1 Encounters:  11/29/18 5\' 5"  (1.651 m)    Weight:   Wt Readings from Last 1 Encounters:  11/29/18 56.7 kg    Ideal Body Weight:  61.8 kg  BMI:  Body mass index is 20.8 kg/m.  Estimated Nutritional Needs:   Kcal:  1700-1900  Protein:  85-95g  Fluid:  1.7L/day  Tilda Franco, MS, RD, LDN [facility] Inpatient Clinical Dietitian Pager: [phone] After Hours Pager: [phone]

## 2018-11-29 NOTE — Consult Note (Addendum)
NAME:  Robert Pham, MRN:  [id number], DOB:  1950/09/09, LOS: 0 ADMISSION DATE:  11/28/2018, CONSULTATION DATE:  11/23/18 REFERRING MD:  Eudelia Bunch  CHIEF COMPLAINT:  Vent Management   Brief History   Robert Pham is a 68 y.o. male with trach / PEG from [facility] who was admitted 5/26 with dyspnea felt to be due to COPD exacerbation.  History of present illness   Pt is non-verbal due to trach and vent dependence; therefore, this HPI is obtained from chart review.  Robert Pham is a 68 y.o. male who resides at [facility] and has a PMH including but not limited to end stage COPD s/p trach and had being worked up / considered for lung transplant at [facility] (last seen in Jan 2020, but this appears to have been prior to him having a tracheostomy - see end of HPI for more info).  He presented to [facility] 5/25 with dyspnea and small volume red frothy sputum / hemoptysis.    In ED, he was bronchospastic and per EDP, had some vent dysynchrony.  I:E ratio was adjusted and he was given 2 BD treatments as well as IV steroids and magnesium to which he had good response.  He was later admitted by [facility] and PCCM was asked to assist with vent management.  Per care everywhere and notes from [facility], pt had been followed by [facility] since 2015 but had been deemed too stable to gain a clear survival benefit from transplant at the time.  Last office visit Jan 2020 with recs for repeat chest CT, right cath, echo, and consideration of relocation as might be close to his window.   Of note, it does not appear that pt had trach at the time since notes state he was from home and had been exercising on treadmill several days per week.  I am not able to locate details on when he required trach placement as no paperwork is available in his room.   Per EDP notes, pt was hospitalized at outside hospital in March for pseudomonal PNA and was discharged to [facility] around 11/26/18.  Suspect that trach was placed during this hospitalization  and unsure of how this ultimately affects his transplant candidacy.  Past Medical History  Chronic tracheostomy / vent dependence, end stage COPD (had been worked up for transplant at [facility], last seen Jan 2020 but appears to be prior to tracheostomy placement), HTN, HLD, GERD, anxiety, MDD.  Significant Hospital Events   5/26 > admit.  Consults:  PCCM.  Procedures:  None.  Significant Diagnostic Tests:  CXR 5/26 > patchy infiltrates, hyperinflation.  Micro Data:  Blood 5/26 >  >  Sputum 5/26 >  Urine 5/26 >  RVP 5/26 >  SARS CoV2 5/26 > negative.   Antimicrobials:  Vanc 5/25 >  Cefepime 5/25 >    Interim history/subjective:  Comfortable, no distress.  Breathing much improved.   Objective:  Blood pressure 140/83, pulse (!) 120, temperature 98.1 F (36.7 C), temperature source Rectal, resp. rate (!) 30, height 5\' 5"  (1.651 m), weight 56.7 kg, SpO2 100 %.    Vent Mode: PCV FiO2 (%):  [50 %-100 %] 50 % Set Rate:  [14 bmp] 14 bmp PEEP:  [5 cmH20] 5 cmH20 Plateau Pressure:  [22 cmH20] 22 cmH20  No intake or output data in the 24 hours ending 11/29/18 0204 Filed Weights   11/29/18 0115  Weight: 56.7 kg    Examination: General: Adult male, in NAD. Neuro: Awake, mouths words  appropriately, follows basic commands. HEENT: [place]/AT. Sclerae anicteric.  Trach C/D/I.  Minimal red frothy secretions noted in trach tubing. Cardiovascular: Tachy, regular, no M/R/G.  Lungs: Respirations even and unlabored.  CTA bilaterally, No W/R/R. Abdomen: PEG C/D/I.  BS x 4, soft, NT/ND.  Musculoskeletal: RUE midline PICC with erythema surrounding.  No gross deformities, 1+ edema.  Skin: Intact, warm, no rashes.  Assessment & Plan:   Acute on chronic respiratory failure (with trach / vent dependence due to end stage COPD) - felt to be due to AECOPD and possible HCAP. - Stable for TR[facility] admission to progressive care. - Continue full vent support. - Repeat ABG and adjust vent accordingly. -  Continue empiric abx and follow cultures. -  solumedrol q12hrs. - DuoNebs / Albuterol. - Bronchial hygiene. - Follow CXR.  Possible infection of RUE PICC. - Agree with PICC removal and culture. - Empiric abx.  Consideration for lung transplant at [facility] - this appears to have been prior to pt's trach placement (last office visit there was Jan 2020). - F/u with [facility] after discharge from [facility] as unsure whether he would still be a candidate for transplant now that he is fully vent / trach dependent.  Rest per primary team.  Best Practice:  Diet: Per TRH. Pain/Anxiety/Delirium protocol (if indicated): N/A. VAP protocol (if indicated): In place. DVT prophylaxis: Per TRH. GI prophylaxis: PPI. Glucose control: Per TRH. Mobility: Bedrest. Code Status: Full. Family Communication: Attempted to call son, but no answer. Disposition: Progressive.  Labs   CBC: Recent Labs  Lab 11/28/18 2346 11/29/18 0015  WBC 16.6*  --   NEUTROABS 13.3*  --   HGB 10.3* 10.2*  HCT 35.3* 30.0*  MCV 92.2  --   PLT 460*  --    Basic Metabolic Panel: Recent Labs  Lab 11/28/18 2346 11/29/18 0015  NA 140 137  K 4.7 4.7  CL 101  --   CO2 30  --   GLUCOSE 136*  --   BUN 21  --   CREATININE 0.51*  --   CALCIUM 9.7  --    GFR: Estimated Creatinine Clearance: 70.9 mL/min (A) (by C-G formula based on SCr of 0.51 mg/dL (L)). Recent Labs  Lab 11/28/18 2346  WBC 16.6*  LATICACIDVEN 1.3   Liver Function Tests: Recent Labs  Lab 11/28/18 2346  AST 18  ALT 19  ALKPHOS 84  BILITOT 0.3  PROT 7.2  ALBUMIN 3.5   No results for input(s): LIPASE, AMYLASE in the last 168 hours. No results for input(s): AMMONIA in the last 168 hours. ABG    Component Value Date/Time   PHART 7.359 11/29/2018 0015   PCO2ART 58.0 (H) 11/29/2018 0015   PO2ART 436.0 (H) 11/29/2018 0015   HCO3 33.1 (H) 11/29/2018 0015   TCO2 35 (H) 11/29/2018 0015   O2SAT 100.0 11/29/2018 0015    Coagulation Profile: No results  for input(s): INR, PROTIME in the last 168 hours. Cardiac Enzymes: No results for input(s): CKTOTAL, CKMB, CKMBINDEX, TROPONINI in the last 168 hours. HbA1C: No results found for: HGBA1C CBG: No results for input(s): GLUCAP in the last 168 hours.  Review of Systems:   Unable to obtain as pt is non-verbal due to trach / vent dependence.  Past medical history  He,  has a past medical history of Anemia, Anxiety, BPH (benign prostatic hyperplasia), COPD (chronic obstructive pulmonary disease) (HCC), GERD (gastroesophageal reflux disease), Hyperlipidemia, Hypertension, Major depressive disorder, and Ventilator dependent (HCC).   Surgical History  History reviewed. No pertinent surgical history.   Social History   reports that he has never smoked. He has never used smokeless tobacco. He reports previous alcohol use. He reports previous drug use.   Family history   His family history is not on file.   Allergies Allergies  Allergen Reactions  . Codeine   . Klonopin [Clonazepam]      Home meds  Prior to Admission medications   Not on File    Critical care time: 35 min.    Rutherford Guysahul Giovonnie Trettel, PA [facility] Pager: [phone].  If no answer, [phone] 11/29/2018, 2:04 AM

## 2018-11-29 NOTE — Progress Notes (Signed)
Bilateral lower extremity venous duplex completed. Preliminary report given to Mardella Layman, RN Report pending in Chart review CV Proc. [employer], RVS 11/29/2018, 8:47 AM

## 2018-11-29 NOTE — Progress Notes (Signed)
[facility] PLAN OF CARE NOTE Patient: Robert Pham WPV:[id number]   PCP: Crist Fat, MD DOB: 1951/06/09   DOA: 11/28/2018   DOS: 11/29/2018    Patient was admitted by my colleague Dr. Toniann Fail earlier on 11/29/2018. I have reviewed the H&P as well as assessment and plan and agree with the same. Important changes in the plan are listed below.  Plan of care: Principal Problem:   Acute respiratory failure with hypoxia (HCC) Active Problems:   COPD exacerbation (HCC)   Essential hypertension   Pressure injury of skin  Vent and precedex Management per PCCM   Tube feeding per dietitian  Blood pressure stable, monitor while on precedex  Hemoptysis  Management per PCCM   Possible cellulitis at picc line site Suspected line infection  Continue Antibiotics  Follow blood culture  Author: Lynden Oxford, MD [facility] 11/29/2018 5:11 PM   If 7PM-7AM, please contact night-coverage at [email or web address]

## 2018-11-29 NOTE — Progress Notes (Signed)
Pharmacy Antibiotic Note  Robert Pham is a 68 y.o. male admitted on 11/28/2018 with pneumonia.  Pharmacy has been consulted for vancomycin and cefepime dosing.  Has hx of end-stage lung disease currently undergoing evaluation for lung transplant at [facility]. Presenting today with respiratory distress- now intubated. WBC 16.6, LA 1.3, temp 96.6 on arrival. Scr 0.51 (CrCl 70 mL/min).   Plan: Vancomycin 1g IV every 12 hours  Cefepime 2 g IV every 8 hours Monitor renal fx, cx results, clinical pic, and vanc levels as appropriate  Height: 5\' 5"  (165.1 cm) IBW/kg (Calculated) : 61.5  Temp (24hrs), Avg:96.6 F (35.9 C), Min:96.6 F (35.9 C), Max:96.6 F (35.9 C)  Recent Labs  Lab 11/28/18 2346  WBC 16.6*  LATICACIDVEN 1.3    CrCl cannot be calculated (No successful lab value found.).    Allergies  Allergen Reactions  . Codeine   . Klonopin [Clonazepam]     Antimicrobials this admission: Vanc 5/26 >>  Cefepime 5/26 >>   Dose adjustments this admission: N/A  Microbiology results: 5/26 BCx: sent 5/25 COVID: neg  Thank you for allowing pharmacy to be a part of this patient's care.  Sherron Monday, PharmD, BCCCP Clinical Pharmacist  Pager: [phone] Phone: [phone] 11/29/2018 12:37 AM

## 2018-11-29 NOTE — Progress Notes (Signed)
Patient requesting Morphine at this time, E-link called awaiting orders.  Patient takes MSIR PRN at facility

## 2018-11-29 NOTE — ED Notes (Signed)
[phone] Robert Pham pts son wants a recent update

## 2018-11-29 NOTE — ED Triage Notes (Signed)
BIB EMS from [facility].Staff called out for respiratory distress and hemoptysis that has worsened throughout the day. Also has swelling to BLE. EMS currently bagging pt through trach.

## 2018-11-29 NOTE — ED Notes (Signed)
Robert [phone]

## 2018-11-29 NOTE — ED Notes (Signed)
Update given to pt son Samuel Bouche) via telephone. Will call back when room is assigned.

## 2018-11-29 NOTE — ED Notes (Signed)
Presents with midline to RUE, appears infected. Redness surrounding the catheter. Denies pain, no swelling, will not draw back blood. IV team at bedside to remove.

## 2018-11-29 NOTE — ED Notes (Signed)
Attempted to call report

## 2018-11-29 NOTE — Progress Notes (Signed)
PCCM INTERVAL PROGRESS NOTE  See Consult done by PCCM night team around 5 AM.  I have reviewed patient information and EMR and discussed care with RN. Robert Pham is comfortable on vent via trach and oxygenating well.   Agree with plan as laid out by consulting team. PCCM is available as needed and will formally round on Robert Pham 5/27.   Will order ABG for AM  Joneen Roach, AGACNP-BC [facility] Pulmonary/Critical Care Pager [phone] or [phone]  11/29/2018 2:20 PM

## 2018-11-29 NOTE — ED Notes (Signed)
ED TO INPATIENT HANDOFF REPORT  ED Nurse Name and Phone #: Enriqueta Shutter [phone]  S Name/Age/Gender Robert Pham 68 y.o. male Room/Bed: 034C/034C  Code Status   Code Status: Full Code  Home/SNF/Other Skilled nursing facility Patient oriented to: situation Is this baseline? Yes   Triage Complete: Triage complete  Chief Complaint [facility] pt  Triage Note BIB EMS from [facility].Staff called out for respiratory distress and hemoptysis that has worsened throughout the day. Also has swelling to BLE. EMS currently bagging pt through trach.    Allergies Allergies  Allergen Reactions  . Codeine   . Klonopin [Clonazepam]     Level of Care/Admitting Diagnosis ED Disposition    ED Disposition Condition Comment   Admit  Hospital Area: [facility] [[id number]]  Level of Care: Progressive [102]  Covid Evaluation: N/A  Diagnosis: Acute respiratory failure with hypoxia [facility]) [[id number]]  Admitting Physician: Eduard Clos [phone]  Attending Physician: Eduard Clos [phone]  Estimated length of stay: past midnight tomorrow  Certification:: I certify this patient will need inpatient services for at least 2 midnights  PT Class (Do Not Modify): Inpatient [101]  PT Acc Code (Do Not Modify): Private [1]       B Medical/Surgery History Past Medical History:  Diagnosis Date  . Anemia   . Anxiety   . BPH (benign prostatic hyperplasia)   . COPD (chronic obstructive pulmonary disease) (HCC)   . GERD (gastroesophageal reflux disease)   . Hyperlipidemia   . Hypertension   . Major depressive disorder   . Ventilator dependent [facility])    History reviewed. No pertinent surgical history.   A IV Location/Drains/Wounds Patient Lines/Drains/Airways Status   Active Line/Drains/Airways    Name:   Placement date:   Placement time:   Site:   Days:   Peripheral IV 11/29/18 Left Forearm   11/29/18    0011    Forearm   less than 1   Tracheostomy Shiley 6 mm Cuffed   -     -    6 mm             Intake/Output Last 24 hours No intake or output data in the 24 hours ending 11/29/18 9562  Labs/Imaging Results for orders placed or performed during the hospital encounter of 11/28/18 (from the past 48 hour(s))  Lactic acid, plasma     Status: None   Collection Time: 11/28/18 11:46 PM  Result Value Ref Range   Lactic Acid, Venous 1.3 0.5 - 1.9 mmol/L    Comment: Performed at [facility], [address] [place], [place] [address]  Comprehensive metabolic panel     Status: Abnormal   Collection Time: 11/28/18 11:46 PM  Result Value Ref Range   Sodium 140 135 - 145 mmol/L   Potassium 4.7 3.5 - 5.1 mmol/L   Chloride 101 98 - 111 mmol/L   CO2 30 22 - 32 mmol/L   Glucose, Bld 136 (H) 70 - 99 mg/dL   BUN 21 8 - 23 mg/dL   Creatinine, Ser 5.78 (L) 0.61 - 1.24 mg/dL   Calcium 9.7 8.9 - 46.9 mg/dL   Total Protein 7.2 6.5 - 8.1 g/dL   Albumin 3.5 3.5 - 5.0 g/dL   AST 18 15 - 41 U/L   ALT 19 0 - 44 U/L   Alkaline Phosphatase 84 38 - 126 U/L   Total Bilirubin 0.3 0.3 - 1.2 mg/dL   GFR calc non Af Amer >60 >60 mL/min  GFR calc Af Amer >60 >60 mL/min   Anion gap 9 5 - 15    Comment: Performed at [facility], [address] [place], [place] [address]  CBC WITH DIFFERENTIAL     Status: Abnormal   Collection Time: 11/28/18 11:46 PM  Result Value Ref Range   WBC 16.6 (H) 4.0 - 10.5 K/uL   RBC 3.83 (L) 4.22 - 5.81 MIL/uL   Hemoglobin 10.3 (L) 13.0 - 17.0 g/dL   HCT 55.2 (L) 08.0 - 22.3 %   MCV 92.2 80.0 - 100.0 fL   MCH 26.9 26.0 - 34.0 pg   MCHC 29.2 (L) 30.0 - 36.0 g/dL   RDW 36.1 22.4 - 49.7 %   Platelets 460 (H) 150 - 400 K/uL   nRBC 0.0 0.0 - 0.2 %   Neutrophils Relative % 79 %   Neutro Abs 13.3 (H) 1.7 - 7.7 K/uL   Lymphocytes Relative 5 %   Lymphs Abs 0.8 0.7 - 4.0 K/uL   Monocytes Relative 11 %   Monocytes Absolute 1.8 (H) 0.1 - 1.0 K/uL   Eosinophils Relative 3 %   Eosinophils Absolute 0.4 0.0 - 0.5 K/uL   Basophils Relative 1 %    Basophils Absolute 0.1 0.0 - 0.1 K/uL   Immature Granulocytes 1 %   Abs Immature Granulocytes 0.10 (H) 0.00 - 0.07 K/uL    Comment: Performed at [facility], [address], [place], [place] [address]  Brain natriuretic peptide     Status: None   Collection Time: 11/28/18 11:46 PM  Result Value Ref Range   B Natriuretic Peptide 75.2 0.0 - 100.0 pg/mL    Comment: Performed at [facility], [address], [place], [place] [address]  SARS Coronavirus 2 (CEPHEID - Performed in [facility] hospital lab), Hosp Order     Status: None   Collection Time: 11/28/18 11:57 PM  Result Value Ref Range   SARS Coronavirus 2 NEGATIVE NEGATIVE    Comment: (NOTE) If result is NEGATIVE SARS-CoV-2 target nucleic acids are NOT DETECTED. The SARS-CoV-2 RNA is generally detectable in upper and lower  respiratory specimens during the acute phase of infection. The lowest  concentration of SARS-CoV-2 viral copies this assay can detect is 250  copies / mL. A negative result does not preclude SARS-CoV-2 infection  and should not be used as the sole basis for treatment or other  patient management decisions.  A negative result may occur with  improper specimen collection / handling, submission of specimen other  than nasopharyngeal swab, presence of viral mutation(s) within the  areas targeted by this assay, and inadequate number of viral copies  (<250 copies / mL). A negative result must be combined with clinical  observations, patient history, and epidemiological information. If result is POSITIVE SARS-CoV-2 target nucleic acids are DETECTED. The SARS-CoV-2 RNA is generally detectable in upper and lower  respiratory specimens dur ing the acute phase of infection.  Positive  results are indicative of active infection with SARS-CoV-2.  Clinical  correlation with patient history and other diagnostic information is  necessary to determine patient infection status.  Positive results do  not rule  out bacterial infection or co-infection with other viruses. If result is PRESUMPTIVE POSTIVE SARS-CoV-2 nucleic acids MAY BE PRESENT.   A presumptive positive result was obtained on the submitted specimen  and confirmed on repeat testing.  While 2019 novel coronavirus  (SARS-CoV-2) nucleic acids may be present in the submitted sample  additional confirmatory testing may be necessary for epidemiological  and / or clinical management purposes  to differentiate between  SARS-CoV-2 and other Sarbecovirus currently known to infect humans.  If clinically indicated additional testing with an alternate test  methodology [phone]) is advised. The SARS-CoV-2 RNA is generally  detectable in upper and lower respiratory sp ecimens during the acute  phase of infection. The expected result is Negative. Fact Sheet for Patients:  BoilerBrush.com.cy Fact Sheet for Healthcare Providers: https://pope.com/ This test is not yet approved or cleared by the [place] FDA and has been authorized for detection and/or diagnosis of SARS-CoV-2 by FDA under an Emergency Use Authorization (EUA).  This EUA will remain in effect (meaning this test can be used) for the duration of the COVID-19 declaration under Section 564(b)(1) of the Act, 21 U.S.C. section 360bbb-3(b)(1), unless the authorization is terminated or revoked sooner. Performed at [facility], [address] [place], [place] [address]   I-STAT 7, (LYTES, BLD GAS, ICA, H+H)     Status: Abnormal   Collection Time: 11/29/18 12:15 AM  Result Value Ref Range   pH, Arterial 7.359 7.350 - 7.450   pCO2 arterial 58.0 (H) 32.0 - 48.0 mmHg   pO2, Arterial 436.0 (H) 83.0 - 108.0 mmHg   Bicarbonate 33.1 (H) 20.0 - 28.0 mmol/L   TCO2 35 (H) 22 - 32 mmol/L   O2 Saturation 100.0 %   Acid-Base Excess 6.0 (H) 0.0 - 2.0 mmol/L   Sodium 137 135 - 145 mmol/L   Potassium 4.7 3.5 - 5.1 mmol/L   Calcium, Ion 1.28  1.15 - 1.40 mmol/L   HCT 30.0 (L) 39.0 - 52.0 %   Hemoglobin 10.2 (L) 13.0 - 17.0 g/dL   Patient temperature 82.9 F    Collection site RADIAL, ALLEN'S TEST ACCEPTABLE    Drawn by Operator    Sample type ARTERIAL   I-STAT 7, (LYTES, BLD GAS, ICA, H+H)     Status: Abnormal   Collection Time: 11/29/18  3:03 AM  Result Value Ref Range   pH, Arterial 7.338 (L) 7.350 - 7.450   pCO2 arterial 51.2 (H) 32.0 - 48.0 mmHg   pO2, Arterial 198.0 (H) 83.0 - 108.0 mmHg   Bicarbonate 27.5 20.0 - 28.0 mmol/L   TCO2 29 22 - 32 mmol/L   O2 Saturation 100.0 %   Acid-Base Excess 1.0 0.0 - 2.0 mmol/L   Sodium 139 135 - 145 mmol/L   Potassium 3.8 3.5 - 5.1 mmol/L   Calcium, Ion 1.32 1.15 - 1.40 mmol/L   HCT 30.0 (L) 39.0 - 52.0 %   Hemoglobin 10.2 (L) 13.0 - 17.0 g/dL   Patient temperature 56.2 F    Collection site RADIAL, ALLEN'S TEST ACCEPTABLE    Drawn by RT    Sample type ARTERIAL   Basic metabolic panel     Status: Abnormal   Collection Time: 11/29/18  4:58 AM  Result Value Ref Range   Sodium 139 135 - 145 mmol/L   Potassium 4.1 3.5 - 5.1 mmol/L   Chloride 100 98 - 111 mmol/L   CO2 27 22 - 32 mmol/L   Glucose, Bld 179 (H) 70 - 99 mg/dL   BUN 25 (H) 8 - 23 mg/dL   Creatinine, Ser 1.30 0.61 - 1.24 mg/dL   Calcium 9.7 8.9 - 86.5 mg/dL   GFR calc non Af Amer >60 >60 mL/min   GFR calc Af Amer >60 >60 mL/min   Anion gap 12 5 -  15    Comment: Performed at [facility], [address] [place], [place] [address]  CBC     Status: Abnormal   Collection Time: 11/29/18  4:58 AM  Result Value Ref Range   WBC 14.8 (H) 4.0 - 10.5 K/uL   RBC 3.35 (L) 4.22 - 5.81 MIL/uL   Hemoglobin 9.0 (L) 13.0 - 17.0 g/dL   HCT 60.430.7 (L) 54.039.0 - 98.152.0 %   MCV 91.6 80.0 - 100.0 fL   MCH 26.9 26.0 - 34.0 pg   MCHC 29.3 (L) 30.0 - 36.0 g/dL   RDW 19.114.9 47.811.5 - 29.515.5 %   Platelets 386 150 - 400 K/uL   nRBC 0.0 0.0 - 0.2 %    Comment: Performed at [facility], [address] [place], [place] [address]  CBG  monitoring, ED     Status: Abnormal   Collection Time: 11/29/18  6:21 AM  Result Value Ref Range   Glucose-Capillary 138 (H) 70 - 99 mg/dL  Urinalysis, Routine w reflex microscopic     Status: Abnormal   Collection Time: 11/29/18  6:28 AM  Result Value Ref Range   Color, Urine YELLOW YELLOW   APPearance TURBID (A) CLEAR   Specific Gravity, Urine 1.025 1.005 - 1.030   pH 5.5 5.0 - 8.0   Glucose, UA NEGATIVE NEGATIVE mg/dL   Hgb urine dipstick SMALL (A) NEGATIVE   Bilirubin Urine NEGATIVE NEGATIVE   Ketones, ur NEGATIVE NEGATIVE mg/dL   Protein, ur NEGATIVE NEGATIVE mg/dL   Nitrite NEGATIVE NEGATIVE   Leukocytes,Ua MODERATE (A) NEGATIVE    Comment: Performed at [facility], [address] [place], [place] [address]  Urinalysis, Microscopic (reflex)     Status: Abnormal   Collection Time: 11/29/18  6:28 AM  Result Value Ref Range   RBC / HPF 6-10 0 - 5 RBC/hpf   WBC, UA >50 0 - 5 WBC/hpf   Bacteria, UA MANY (A) NONE SEEN   Squamous Epithelial / LPF NONE SEEN 0 - 5   Budding Yeast PRESENT    Hyphae Yeast PRESENT    Hyaline Casts, UA PRESENT    Ca Oxalate Crys, UA PRESENT     Comment: Performed at [facility], [address] [place], [place] [address]  Type and screen [facility]     Status: None   Collection Time: 11/29/18  8:39 AM  Result Value Ref Range   ABO/RH(D) A POS    Antibody Screen NEG    Sample Expiration      12/02/2018,2359 Performed at [facility], [address] [place], [place] [address]   ABO/Rh     Status: None (Preliminary result)   Collection Time: 11/29/18  8:39 AM  Result Value Ref Range   ABO/RH(D)      A POS Performed at [facility], [address] [place], [place] [address]    Dg Chest Port 1 View  Result Date: 11/29/2018 CLINICAL DATA:  Respiratory distress EXAM: PORTABLE CHEST 1 VIEW COMPARISON:  None. FINDINGS: Cardiac shadows within normal limits. Tracheostomy tube is noted in satisfactory  position. Patchy infiltrates are noted in the bases bilaterally. No sizable effusion is seen. Hyperinflation consistent with COPD is noted. No bony abnormality is seen. IMPRESSION: Mild patchy bibasilar infiltrates. COPD. Electronically Signed   By: Alcide CleverMark  Lukens M.D.   On: 11/29/2018 00:25    Pending Labs Unresulted Labs (From admission, onward)  Start     Ordered   11/29/18 0500  HIV antibody (Routine Testing)  Tomorrow morning,   R     11/29/18 0330   11/29/18 0218  Respiratory Panel by PCR  (Respiratory virus panel with precautions)  Once,   R     11/29/18 0217   11/28/18 2346  Lactic acid, plasma  Now then every 2 hours,   STAT     11/28/18 2348   11/28/18 2346  Blood Culture (routine x 2)  BLOOD CULTURE X 2,   STAT     11/28/18 2348          Vitals/Pain Today's Vitals   11/29/18 0615 11/29/18 0630 11/29/18 0738 11/29/18 0949  BP: (!) 127/97 130/77    Pulse: (!) 107 (!) 105  (!) 108  Resp: (!) 28 (!) 24  19  Temp:      TempSrc:      SpO2: 100% 100% 100% 100%  Weight:      Height:      PainSc:        Isolation Precautions Droplet precaution  Medications Medications  nitroGLYCERIN (NITROSTAT) SL tablet 0.4 mg (has no administration in time range)  ceFEPIme (MAXIPIME) 2 g in sodium chloride 0.9 % 100 mL IVPB (has no administration in time range)  vancomycin (VANCOCIN) IVPB 1000 mg/200 mL premix (has no administration in time range)  methylPREDNISolone sodium succinate (SOLU-MEDROL) 40 mg/mL injection 40 mg (has no administration in time range)  ipratropium-albuterol (DUONEB) 0.5-2.5 (3) MG/3ML nebulizer solution 3 mL (3 mLs Nebulization Given 11/29/18 0738)  albuterol (PROVENTIL) (2.5 MG/3ML) 0.083% nebulizer solution 2.5 mg (has no administration in time range)  pantoprazole (PROTONIX) injection 40 mg (has no administration in time range)  acetaminophen (TYLENOL) tablet 650 mg (has no administration in time range)    Or  acetaminophen (TYLENOL) suppository 650 mg  (has no administration in time range)  ondansetron (ZOFRAN) tablet 4 mg (has no administration in time range)    Or  ondansetron (ZOFRAN) injection 4 mg (has no administration in time range)  QUEtiapine (SEROQUEL) tablet 25 mg (has no administration in time range)  feeding supplement (JEVITY 1.2 CAL) liquid 1,000 mL (has no administration in time range)  lisinopril (ZESTRIL) tablet 10 mg (has no administration in time range)  furosemide (LASIX) injection 20 mg (20 mg Intravenous Given 11/29/18 0001)  ipratropium (ATROVENT) nebulizer solution 0.5 mg (0.5 mg Nebulization Given 11/29/18 0002)  magnesium sulfate IVPB 2 g 50 mL (0 g Intravenous Stopped 11/29/18 0127)  methylPREDNISolone sodium succinate (SOLU-MEDROL) 125 mg/2 mL injection 125 mg (125 mg Intravenous Given 11/29/18 0004)  vancomycin (VANCOCIN) IVPB 1000 mg/200 mL premix (0 mg Intravenous Stopped 11/29/18 0538)  ceFEPIme (MAXIPIME) 2 g in sodium chloride 0.9 % 100 mL IVPB (0 g Intravenous Stopped 11/29/18 0326)  ipratropium (ATROVENT) nebulizer solution 0.5 mg ( Nebulization Canceled Entry 11/29/18 0109)    Mobility Unknown      Focused Assessments Pulmonary Assessment Handoff:  Lung sounds: Bilateral Breath Sounds: Diminished, Rhonchi L Breath Sounds: Diminished, Rhonchi R Breath Sounds: Diminished, Rhonchi O2 Device: Tracheostomy Collar        R Recommendations: See Admitting Provider Note  Report given to:   Additional Notes: Pt lives at [facility], is trached, able to write to communicate.

## 2018-11-29 NOTE — Progress Notes (Signed)
   Called to bedside  Earlier was agitated and anxious Now calm but feels anxious Has trach  Plan Start precedex     SIGNATURE    Dr. Kalman Shan, M.D., F.C.C.P,  Pulmonary and Critical Care Medicine Staff Physician, [facility] Director - Interstitial Lung Disease  Program  Pulmonary Fibrosis [facility] [place], [place], [address]  Pager: [phone], If no answer or between  15:00h - 7:00h: call [phone] Telephone: [phone]  4:06 PM 11/29/2018

## 2018-11-29 NOTE — Progress Notes (Signed)
Pt feeling very anxious with copious amounts of secretions.  Suctioned pt and then called PCCM team when suctioning did not alleviate the anxiety.  Pt calmed down by time of Ramaswamy MD arrival.  Plan is to continue with scheduled breathing treatments and begin Precedex.  Team will round again in the morning.

## 2018-11-29 NOTE — ED Notes (Signed)
Pharmacist needs a weight in pts chart

## 2018-11-29 NOTE — Plan of Care (Signed)
Patient having anxiety, refuses precedex gtt.  Requiring frequent suctioning, thick pink tinged sputum.

## 2018-11-29 NOTE — H&P (Signed)
History and Physical    Robert Pham ZOX:[id number] DOB: 21-Feb-1951 DOA: 11/28/2018  PCP: Crist FatVan Eyk, Jason, MD  Patient coming from: Home.  Chief Complaint: Shortness of breath.  HPI: Robert Pham is a 68 y.o. 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  Denies any chest pain fever chills nausea vomiting abdominal pain or diarrhea.  In addition patient has noticed increasing swelling of the lower extremities bilaterally over the last few days.  ED Course: In the ER patient was afebrile chest x-ray was not showing anything acute.  Had some bloody discharge from the trach area.  On-call pulmonary critical care was consulted and patient placed on nebulizer treatment IV steroids for COPD exacerbation.  Patient's right upper extremity PICC line area looks slightly red and erythematous for which PICC line was removed and placed on empiric antibiotics after blood cultures obtained.  Per report patient had recent Candida UTI and also had Pseudomonas pneumonia.  Patient also was given Lasix 20 mg IV in the ER for lower extremity edema.  Patient's labs show creatinine of 0.5 sodium 140 potassium 4.7 BNP 75.2 hemoglobin 10.3 platelets 460.  EKG sinus tachycardia.  Review of Systems: As per HPI, rest all negative.   Past Medical History:  Diagnosis Date  . Anemia   . Anxiety   . BPH (benign prostatic hyperplasia)   . COPD (chronic obstructive pulmonary disease) (HCC)   . GERD (gastroesophageal reflux disease)   . Hyperlipidemia   . Hypertension   . Major depressive disorder   . Ventilator dependent [facility](HCC)     History reviewed. No pertinent surgical history.   reports that he has never smoked. He has never used smokeless tobacco. He reports previous alcohol use. He  reports previous drug use.  Allergies  Allergen Reactions  . Codeine   . Klonopin [Clonazepam]     Family History  Problem Relation Age of Onset  . Stroke Father     Prior to Admission medications   Not on File    Physical Exam: Vitals:   11/29/18 0130 11/29/18 0145 11/29/18 0200 11/29/18 0215  BP: 131/84 111/81 101/77 105/83  Pulse: (!) 128 (!) 115 (!) 125 (!) 111  Resp: (!) 29 (!) 36 (!) 28 20  Temp:      TempSrc:      SpO2: 100% 100% 100% 100%  Weight:      Height:          Constitutional: Moderately built and nourished. Vitals:   11/29/18 0130 11/29/18 0145 11/29/18 0200 11/29/18 0215  BP: 131/84 111/81 101/77 105/83  Pulse: (!) 128 (!) 115 (!) 125 (!) 111  Resp: (!) 29 (!) 36 (!) 28 20  Temp:      TempSrc:      SpO2: 100% 100% 100% 100%  Weight:      Height:       Eyes: Anicteric no pallor. ENMT: No discharge from the ears eyes nose or mouth. Neck: Trach seen with some discharge which appears to be radiation. Respiratory: Bilateral air entry present appears tight no crepitations. Cardiovascular: S1-S2 heard. Abdomen: Soft nontender bowel sounds present.  PEG tube seen. Musculoskeletal: Bilateral lower extremity edema present. Skin: No rash. Neurologic: Alert awake oriented to time place and person.  Moves all  extremities. Psychiatric: Appears normal per normal affect.   Labs on Admission: I have personally reviewed following labs and imaging studies  CBC: Recent Labs  Lab 11/28/18 2346 11/29/18 0015 11/29/18 0303  WBC 16.6*  --   --   NEUTROABS 13.3*  --   --   HGB 10.3* 10.2* 10.2*  HCT 35.3* 30.0* 30.0*  MCV 92.2  --   --   PLT 460*  --   --    Basic Metabolic Panel: Recent Labs  Lab 11/28/18 2346 11/29/18 0015 11/29/18 0303  NA 140 137 139  K 4.7 4.7 3.8  CL 101  --   --   CO2 30  --   --   GLUCOSE 136*  --   --   BUN 21  --   --   CREATININE 0.51*  --   --   CALCIUM 9.7  --   --    GFR: Estimated Creatinine Clearance:  70.9 mL/min (A) (by C-G formula based on SCr of 0.51 mg/dL (L)). Liver Function Tests: Recent Labs  Lab 11/28/18 2346  AST 18  ALT 19  ALKPHOS 84  BILITOT 0.3  PROT 7.2  ALBUMIN 3.5   No results for input(s): LIPASE, AMYLASE in the last 168 hours. No results for input(s): AMMONIA in the last 168 hours. Coagulation Profile: No results for input(s): INR, PROTIME in the last 168 hours. Cardiac Enzymes: No results for input(s): CKTOTAL, CKMB, CKMBINDEX, TROPONINI in the last 168 hours. BNP (last 3 results) No results for input(s): PROBNP in the last 8760 hours. HbA1C: No results for input(s): HGBA1C in the last 72 hours. CBG: No results for input(s): GLUCAP in the last 168 hours. Lipid Profile: No results for input(s): CHOL, HDL, LDLCALC, TRIG, CHOLHDL, LDLDIRECT in the last 72 hours. Thyroid Function Tests: No results for input(s): TSH, T4TOTAL, FREET4, T3FREE, THYROIDAB in the last 72 hours. Anemia Panel: No results for input(s): VITAMINB12, FOLATE, FERRITIN, TIBC, IRON, RETICCTPCT in the last 72 hours. Urine analysis: No results found for: COLORURINE, APPEARANCEUR, LABSPEC, PHURINE, GLUCOSEU, HGBUR, BILIRUBINUR, KETONESUR, PROTEINUR, UROBILINOGEN, NITRITE, LEUKOCYTESUR Sepsis Labs: @LABRCNTIP (procalcitonin:4,lacticidven:4) ) Recent Results (from the past 240 hour(s))  SARS Coronavirus 2 (CEPHEID - Performed in [facility] hospital lab), Hosp Order     Status: None   Collection Time: 11/28/18 11:57 PM  Result Value Ref Range Status   SARS Coronavirus 2 NEGATIVE NEGATIVE Final    Comment: (NOTE) If result is NEGATIVE SARS-CoV-2 target nucleic acids are NOT DETECTED. The SARS-CoV-2 RNA is generally detectable in upper and lower  respiratory specimens during the acute phase of infection. The lowest  concentration of SARS-CoV-2 viral copies this assay can detect is 250  copies / mL. A negative result does not preclude SARS-CoV-2 infection  and should not be used as the sole  basis for treatment or other  patient management decisions.  A negative result may occur with  improper specimen collection / handling, submission of specimen other  than nasopharyngeal swab, presence of viral mutation(s) within the  areas targeted by this assay, and inadequate number of viral copies  (<250 copies / mL). A negative result must be combined with clinical  observations, patient history, and epidemiological information. If result is POSITIVE SARS-CoV-2 target nucleic acids are DETECTED. The SARS-CoV-2 RNA is generally detectable in upper and lower  respiratory specimens dur ing the acute phase of infection.  Positive  results are indicative of active infection with SARS-CoV-2.  Clinical  correlation with patient history  and other diagnostic information is  necessary to determine patient infection status.  Positive results do  not rule out bacterial infection or co-infection with other viruses. If result is PRESUMPTIVE POSTIVE SARS-CoV-2 nucleic acids MAY BE PRESENT.   A presumptive positive result was obtained on the submitted specimen  and confirmed on repeat testing.  While 2019 novel coronavirus  (SARS-CoV-2) nucleic acids may be present in the submitted sample  additional confirmatory testing may be necessary for epidemiological  and / or clinical management purposes  to differentiate between  SARS-CoV-2 and other Sarbecovirus currently known to infect humans.  If clinically indicated additional testing with an alternate test  methodology [id number]) is advised. The SARS-CoV-2 RNA is generally  detectable in upper and lower respiratory sp ecimens during the acute  phase of infection. The expected result is Negative. Fact Sheet for Patients:  BoilerBrush.com.cy Fact Sheet for Healthcare Providers: https://pope.com/ This test is not yet approved or cleared by the [place] FDA and has been authorized for detection  and/or diagnosis of SARS-CoV-2 by FDA under an Emergency Use Authorization (EUA).  This EUA will remain in effect (meaning this test can be used) for the duration of the COVID-19 declaration under Section 564(b)(1) of the Act, 21 U.S.C. section 360bbb-3(b)(1), unless the authorization is terminated or revoked sooner. Performed at [facility], [address], [place], [place] [address]      Radiological Exams on Admission: Dg Chest Port 1 View  Result Date: 11/29/2018 CLINICAL DATA:  Respiratory distress EXAM: PORTABLE CHEST 1 VIEW COMPARISON:  None. FINDINGS: Cardiac shadows within normal limits. Tracheostomy tube is noted in satisfactory position. Patchy infiltrates are noted in the bases bilaterally. No sizable effusion is seen. Hyperinflation consistent with COPD is noted. No bony abnormality is seen. IMPRESSION: Mild patchy bibasilar infiltrates. COPD. Electronically Signed   By: Alcide Clever M.D.   On: 11/29/2018 00:25    EKG: Independently reviewed.  Sinus tachycardia.  Assessment/Plan Principal Problem:   Acute respiratory failure with hypoxia (HCC) Active Problems:   COPD exacerbation (HCC)   Essential hypertension    1. Acute respiratory failure hypoxia secondary COPD exacerbation patient has trach and vent dependent for which pulmonary critical care has been consulted patient has been placed on nebulizer empiric antibiotics and IV steroids.  Since patient has lower extremity edema 1 dose of Lasix was given will check 2D echo and Dopplers of the lower extremity.  Based on the response of Lasix further doses to be decided. 2. Hypertension per patient patient is on lisinopril which should be continued. 3. Anemia with hemoptysis.  Last hemoglobin done in January 2020 was 13.6.  Closely follow CBC type and screen. 4. Possible infection of the right upper extremity at the PICC line site.  PICC line was removed blood cultures obtained empiric antibiotics presently. 5. PEG  tube feeds for which I discussed with pharmacy patient takes Isosource which pharmacy will be ordering.  Patient's home medication is to be reconciled.  Please follow-up.   DVT prophylaxis: SCDs because patient has been exam hemoptysis. Code Status: Full code. Family Communication: No family at the bedside. Disposition Plan: To be determined. Consults called: Pulmonary critical care. Admission status: Inpatient.   Eduard Clos MD [facility] Pager [phone].  If 7PM-7AM, please contact night-coverage [email or web address] Password [email or web address]  11/29/2018, 3:31 AM

## 2018-11-29 NOTE — Progress Notes (Signed)
eLink Physician-Brief Progress Note Patient Name: Robert Pham DOB: Mar 18, 1951 MRN: [id number]   Date of Service  11/29/2018  HPI/Events of Note  Patient requests cough medication.  eICU Interventions  Will order: 1. Robitussin DM 15 mL per tube Q 4 hours PRN cough.     Intervention Category Major Interventions: Other:  Lenell Antu 11/29/2018, 9:52 PM

## 2018-11-30 ENCOUNTER — Inpatient Hospital Stay (HOSPITAL_COMMUNITY): Payer: Medicare Other

## 2018-11-30 LAB — BLOOD CULTURE ID PANEL (REFLEXED)

## 2018-11-30 LAB — POCT I-STAT 7, (LYTES, BLD GAS, ICA,H+H)
Acid-Base Excess: 2 mmol/L (ref 0.0–2.0)
Bicarbonate: 27.7 mmol/L (ref 20.0–28.0)
Calcium, Ion: 1.3 mmol/L (ref 1.15–1.40)
HCT: 29 % — ABNORMAL LOW (ref 39.0–52.0)
Hemoglobin: 9.9 g/dL — ABNORMAL LOW (ref 13.0–17.0)
O2 Saturation: 99 %
Patient temperature: 98
Potassium: 4.3 mmol/L (ref 3.5–5.1)
Sodium: 137 mmol/L (ref 135–145)
TCO2: 29 mmol/L (ref 22–32)
pCO2 arterial: 48.7 mmHg — ABNORMAL HIGH (ref 32.0–48.0)
pH, Arterial: 7.361 (ref 7.350–7.450)
pO2, Arterial: 162 mmHg — ABNORMAL HIGH (ref 83.0–108.0)

## 2018-11-30 LAB — PHOSPHORUS: Phosphorus: 4 mg/dL (ref 2.5–4.6)

## 2018-11-30 LAB — GLUCOSE, CAPILLARY
Glucose-Capillary: 143 mg/dL — ABNORMAL HIGH (ref 70–99)
Glucose-Capillary: 137 mg/dL — ABNORMAL HIGH (ref 70–99)
Glucose-Capillary: 105 mg/dL — ABNORMAL HIGH (ref 70–99)

## 2018-11-30 LAB — MAGNESIUM: Magnesium: 2.3 mg/dL (ref 1.7–2.4)

## 2018-11-30 MED ORDER — ALPRAZOLAM 0.5 MG PO TABS
0.5000 mg | ORAL_TABLET | Freq: Two times a day (BID) | ORAL | Status: DC | PRN
  Administered 2018-11-30 – 2018-12-01 (×4): 0.5 mg via ORAL
  Filled 2018-11-30 (×4): qty 1

## 2018-11-30 MED ORDER — CHLORHEXIDINE GLUCONATE CLOTH 2 % EX PADS
6.0000 | MEDICATED_PAD | Freq: Every day | CUTANEOUS | Status: DC
  Administered 2018-11-30 – 2018-12-14 (×14): 6 via TOPICAL

## 2018-11-30 MED ORDER — FINASTERIDE 5 MG PO TABS
5.0000 mg | ORAL_TABLET | Freq: Every day | ORAL | Status: DC
  Administered 2018-11-30 – 2018-12-14 (×15): 5 mg via ORAL
  Filled 2018-11-30 (×15): qty 1

## 2018-11-30 MED ORDER — TAMSULOSIN HCL 0.4 MG PO CAPS: 0.4000 mg | ORAL_CAPSULE | Freq: Every day | ORAL | Status: DC

## 2018-11-30 MED ORDER — DEXMEDETOMIDINE HCL IN NACL 200 MCG/50ML IV SOLN: 0.4000 ug/kg/h | INTRAVENOUS | Status: DC

## 2018-11-30 MED ORDER — ALUM & MAG HYDROXIDE-SIMETH 200-200-20 MG/5ML PO SUSP
30.0000 mL | Freq: Once | ORAL | Status: AC
  Administered 2018-11-30: 19:00:00 30 mL via ORAL
  Filled 2018-11-30: qty 30

## 2018-11-30 MED ORDER — FENTANYL CITRATE (PF) 100 MCG/2ML IJ SOLN
25.0000 ug | INTRAMUSCULAR | Status: DC | PRN
  Administered 2018-11-30: 25 ug via INTRAVENOUS
  Administered 2018-12-02: 22:00:00 50 ug via INTRAVENOUS
  Administered 2018-12-04 (×2): 25 ug via INTRAVENOUS
  Administered 2018-12-04 – 2018-12-05 (×3): 50 ug via INTRAVENOUS
  Administered 2018-12-05: 25 ug via INTRAVENOUS
  Administered 2018-12-05: 50 ug via INTRAVENOUS
  Administered 2018-12-05 – 2018-12-06 (×2): 25 ug via INTRAVENOUS
  Administered 2018-12-06: 50 ug via INTRAVENOUS
  Administered 2018-12-06: 25 ug via INTRAVENOUS
  Administered 2018-12-07 – 2018-12-08 (×2): 50 ug via INTRAVENOUS
  Administered 2018-12-08: 25 ug via INTRAVENOUS
  Administered 2018-12-09 – 2018-12-10 (×6): 50 ug via INTRAVENOUS
  Administered 2018-12-11: 25 ug via INTRAVENOUS
  Administered 2018-12-11: 50 ug via INTRAVENOUS
  Administered 2018-12-11: 25 ug via INTRAVENOUS
  Administered 2018-12-11: 50 ug via INTRAVENOUS
  Administered 2018-12-11: 30 ug via INTRAVENOUS
  Administered 2018-12-12: 50 ug via INTRAVENOUS
  Administered 2018-12-12: 25 ug via INTRAVENOUS
  Administered 2018-12-12 – 2018-12-14 (×4): 50 ug via INTRAVENOUS
  Filled 2018-11-30 (×34): qty 2

## 2018-11-30 NOTE — Progress Notes (Signed)
This chaplain responded to phone call from [facility] for Pt. AD.  It is the chaplain's understanding the Pt. family will drop off AD paperwork at valet parking for Pt. to sign.  Spiritual care is available for F/U as needed.

## 2018-11-30 NOTE — Plan of Care (Signed)
  Problem: Activity: Goal: Ability to tolerate increased activity will improve Outcome: Progressing   Problem: Respiratory: Goal: Patent airway maintenance will improve Outcome: Progressing   Problem: Role Relationship: Goal: Ability to communicate will improve Outcome: Progressing   Problem: Clinical Measurements: Goal: Respiratory complications will improve Outcome: Progressing Goal: Cardiovascular complication will be avoided Outcome: Progressing

## 2018-11-30 NOTE — Evaluation (Signed)
Physical Therapy Evaluation Patient Details Name: Robert Pham MRN: [id number] DOB: 1951-03-06 Today's Date: 11/30/2018   History of Present Illness  68 y.o. 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 Patient admitted from [facility], on chronic trach and vent dependent. Pt is nonverbal and unable to give history. Per chart review, trach was most likely placed sometime in March 2020.  Clinical Impression  Pt admitted with above diagnosis. Pt currently with functional limitations due to the deficits listed below (see PT Problem List). Pt was able to stand and pivot to recliner with min guard assist. Good stability overall. Slight DOE 2/4 with transfer and pt somewhat anxious. Recovered quickly.  Vent setting 40% FiO2, PEEP 5.  VSS with treatment.  Will continue PT.  Pt will benefit from skilled PT to increase their independence and safety with mobility to allow discharge to the venue listed below.      Follow Up Recommendations LTACH;Supervision/Assistance - 24 hour(Back to [facility])    Equipment Recommendations  None recommended by PT    Recommendations for Other Services       Precautions / Restrictions Precautions Precautions: Fall Restrictions Weight Bearing Restrictions: No      Mobility  Bed Mobility Overal bed mobility: Independent                Transfers Overall transfer level: Needs assistance Equipment used: None Transfers: Sit to/from UGI CorporationStand;Stand Pivot Transfers Sit to Stand: Min guard Stand pivot transfers: Min guard       General transfer comment: Pt had no difficulty transferring bed to chair.   Ambulation/Gait             General Gait Details: Will coordinate with RT tomorrow to try and ambulate pt.   Stairs            Wheelchair Mobility    Modified Rankin (Stroke Patients Only)       Balance Overall balance assessment: Needs assistance Sitting-balance support: No upper extremity supported;Feet supported Sitting balance-Leahy Scale: Good     Standing balance support: No upper extremity supported;During functional activity Standing balance-Leahy Scale: Fair Standing balance comment: can stand staticaly without UE support                             Pertinent Vitals/Pain Pain Assessment: No/denies pain    Home Living Family/patient expects to be discharged to:: Skilled nursing facility Living Arrangements: Children(son) Available Help at Discharge: Family;Available 24 hours/day Type of Home: Mobile home Home Access: Stairs to enter Entrance Stairs-Rails: Right;Left;Can reach both Entrance Stairs-Number of Steps: 5 Home Layout: One level Home Equipment: Emergency planning/management officerhower seat;Walker - 2 wheels;Wheelchair - manual      Prior Function Level of Independence: Independent with assistive device(s)         Comments: Pt states he used the wheelchair vs. rW depending on how he felt     Hand Dominance        Extremity/Trunk Assessment   Upper Extremity Assessment Upper Extremity Assessment: Defer to OT evaluation    Lower Extremity Assessment Lower Extremity Assessment: Generalized weakness    Cervical / Trunk Assessment Cervical / Trunk Assessment: Kyphotic  Communication   Communication: Other (comment)(writes on clipboard due to trach/vent)  Cognition Arousal/Alertness: Awake/alert Behavior During Therapy: WFL for tasks assessed/performed Overall Cognitive Status: Within Functional Limits for tasks assessed                                        General Comments      Exercises General Exercises - Lower Extremity Long Arc Quad: AROM;Both;10 reps;Seated   Assessment/Plan    PT Assessment Patient needs continued PT services  PT Problem List  Decreased activity tolerance;Decreased balance;Decreased mobility;Decreased knowledge of use of DME;Decreased safety awareness;Decreased knowledge of precautions;Cardiopulmonary status limiting activity       PT Treatment Interventions DME instruction;Gait training;Functional mobility training;Therapeutic activities;Stair training;Therapeutic exercise;Balance training;Patient/family education    PT Goals (Current goals can be found in the Care Plan section)  Acute Rehab PT Goals Patient Stated Goal: to get better PT Goal Formulation: With patient Time For Goal Achievement: 12/14/18 Potential to Achieve Goals: Good    Frequency Min 3X/week   Barriers to discharge        Co-evaluation               AM-PAC PT "6 Clicks" Mobility  Outcome Measure Help needed turning from your back to your side while in a flat bed without using bedrails?: None Help needed moving from lying on your back to sitting on the side of a flat bed without using bedrails?: None Help needed moving to and from a bed to a chair (including a wheelchair)?: None Help needed standing up from a chair using your arms (e.g., wheelchair or bedside chair)?: A Little Help needed to walk in hospital room?: A Little Help needed climbing 3-5 steps with a railing? : A Little 6 Click Score: 21    End of Session Equipment Utilized During Treatment: Gait belt;Oxygen(trach on vent) Activity Tolerance: Patient tolerated treatment well Patient left: in chair;with call bell/phone within reach Nurse Communication: Mobility status PT Visit Diagnosis: Unsteadiness on feet (R26.81);Muscle weakness (generalized) (M62.81)    Time: 9480-1655 PT Time Calculation (min) (ACUTE ONLY): 15 min   Charges:   PT Evaluation $PT Eval Moderate Complexity: 1 Mod          Keylon Labelle,PT Acute Rehabilitation Services Pager:  [phone]  Office:  [phone]    Berline Lopes 11/30/2018, 2:17 PM

## 2018-11-30 NOTE — Progress Notes (Signed)
eLink Physician-Brief Progress Note Patient Name: Robert Pham DOB: 1950-12-26 MRN: [id number]   Date of Service  11/30/2018  HPI/Events of Note  Patient on MSContin at [facility]. Request for pain medication.   eICU Interventions  Will order: 1. Fentanyl 25-50 mcg IV Q 4 hours PRN pain.      Intervention Category Intermediate Interventions: Pain - evaluation and management  [name] 11/30/2018, 12:18 AM

## 2018-11-30 NOTE — Progress Notes (Signed)
Specimen Information: Nasopharyngeal Swab; Respiratory      Ref Range & Units 1d ago  Adenovirus NOT DETECTED NOT DETECTED   Coronavirus 229E NOT DETECTED NOT DETECTED   Comment: (NOTE)  The Coronavirus on the Respiratory Panel, DOES NOT test for the novel  Coronavirus (2019 nCoV)   Coronavirus HKU1 NOT DETECTED NOT DETECTED   Coronavirus NL63 NOT DETECTED NOT DETECTED   Coronavirus OC43 NOT DETECTED NOT DETECTED   Metapneumovirus NOT DETECTED NOT DETECTED   Rhinovirus / Enterovirus NOT DETECTED NOT DETECTED   Influenza A NOT DETECTED NOT DETECTED   Influenza B NOT DETECTED NOT DETECTED   Parainfluenza Virus 1 NOT DETECTED NOT DETECTED   Parainfluenza Virus 2 NOT DETECTED NOT DETECTED   Parainfluenza Virus 3 NOT DETECTED NOT DETECTED   Parainfluenza Virus 4 NOT DETECTED NOT DETECTED   Respiratory Syncytial Virus NOT DETECTED NOT DETECTED   Bordetella pertussis NOT DETECTED NOT DETECTED   Chlamydophila pneumoniae NOT DETECTED NOT DETECTED   Mycoplasma pneumoniae NOT DETECTED NOT DETECTED   Comment: Performed at [facility], [address], [place], [place] [address]  Resulting Agency  [facility]      Specimen Collected: 11/29/18 16:02 Last Resulted: 11/29/18 17:45        Droplet precautions discontinued

## 2018-11-30 NOTE — Progress Notes (Signed)
Patient currently on Precedex, which was started on 11/29/2018 due to agitation. Discussed with ICU, they will assume care of patient.   TRH signing off.

## 2018-11-30 NOTE — Progress Notes (Signed)
PHARMACY - PHYSICIAN COMMUNICATION CRITICAL VALUE ALERT - BLOOD CULTURE IDENTIFICATION (BCID)  Robert Pham is an 68 y.o. male who presented to [facility] on 11/28/2018 with a chief complaint of increased SOB and bloody discharge from trach. PMH trach/PEG and ventilator dependent. Noted area around RUE PICC line was erythematous. Line removed and started empiric antibiotics. Admit BCx now 1/4 growing GPCs, BCID CoNS with mecA.  Name of physician (or Provider) Contacted: Ramaswamy  Current antibiotics: Vancomycin and cefepime  Changes to prescribed antibiotics recommended:  None - likely contaminant, will be covered by current antibiotics  Results for orders placed or performed during the hospital encounter of 11/28/18  Blood Culture ID Panel (Reflexed) (Collected: 11/29/2018  2:00 AM)  Result Value Ref Range   Enterococcus species NOT DETECTED NOT DETECTED   Listeria monocytogenes NOT DETECTED NOT DETECTED   Staphylococcus species DETECTED (A) NOT DETECTED   Staphylococcus aureus (BCID) NOT DETECTED NOT DETECTED   Methicillin resistance DETECTED (A) NOT DETECTED   Streptococcus species NOT DETECTED NOT DETECTED   Streptococcus agalactiae NOT DETECTED NOT DETECTED   Streptococcus pneumoniae NOT DETECTED NOT DETECTED   Streptococcus pyogenes NOT DETECTED NOT DETECTED   Acinetobacter baumannii NOT DETECTED NOT DETECTED   Enterobacteriaceae species NOT DETECTED NOT DETECTED   Enterobacter cloacae complex NOT DETECTED NOT DETECTED   Escherichia coli NOT DETECTED NOT DETECTED   Klebsiella oxytoca NOT DETECTED NOT DETECTED   Klebsiella pneumoniae NOT DETECTED NOT DETECTED   Proteus species NOT DETECTED NOT DETECTED   Serratia marcescens NOT DETECTED NOT DETECTED   Haemophilus influenzae NOT DETECTED NOT DETECTED   Neisseria meningitidis NOT DETECTED NOT DETECTED   Pseudomonas aeruginosa NOT DETECTED NOT DETECTED   Candida albicans NOT DETECTED NOT DETECTED   Candida glabrata NOT DETECTED  NOT DETECTED   Candida krusei NOT DETECTED NOT DETECTED   Candida parapsilosis NOT DETECTED NOT DETECTED   Candida tropicalis NOT DETECTED NOT DETECTED   Tressia Labrum N. Zigmund Daniel, PharmD, BCPS PGY2 Infectious Diseases Pharmacy Resident Phone: [phone] 11/30/2018  3:34 PM

## 2018-11-30 NOTE — Progress Notes (Signed)
NAME:  Robert Pham, MRN:  [id number], DOB:  March 03, 1951, LOS: 1 ADMISSION DATE:  11/28/2018, CONSULTATION DATE:  11/23/18 REFERRING MD:  Eudelia Bunchardama  CHIEF COMPLAINT:  Vent Management   Brief History   Robert Pham is a 68 y.o. male with trach / PEG from [facility] who was admitted 5/26 with dyspnea felt to be due to COPD exacerbation.  Past Medical History  Chronic tracheostomy / vent dependence, end stage COPD (had [facility], last seen Jan 2020 but appears to be prior to tracheostomy placement), HTN, HLD, GERD, anxiety, MDD.  Significant Hospital Events   5/26 > admit.  Consults:  PCCM.  Procedures:  None.  Significant Diagnostic Tests:  CXR 5/26 > patchy infiltrates, hyperinflation.  Micro Data:  Blood 5/26 >  >  RVP 5/26 > neg SARS CoV2 5/26 > negative.   Antimicrobials:  Vanc 5/25 >  Cefepime 5/25 >    Interim history/subjective:  Started on precedex last night for agitation. He is concerned this makes him too sleepy. He tells me he is only on vent since Feb and would like to try to wean. He is not fully vent dependent. Complains of bad anxiety.   Objective:  Blood pressure 116/76, pulse 66, temperature 98.3 F (36.8 C), temperature source Oral, resp. rate 14, height 5\' 5"  (1.651 m), weight 56.9 kg, SpO2 100 %.    Vent Mode: PCV FiO2 (%):  [40 %-50 %] 40 % Set Rate:  [14 bmp] 14 bmp PEEP:  [5 cmH20] 5 cmH20 Plateau Pressure:  [16 cmH20-24 cmH20] 16 cmH20   Intake/Output Summary (Last 24 hours) at 11/30/2018 0957 Last data filed at 11/30/2018 0640 Gross per 24 hour  Intake 2143.72 ml  Output 2040 ml  Net 103.72 ml   Filed Weights   11/29/18 0115 11/30/18 0500  Weight: 56.7 kg 56.9 kg    Examination:  General: Adult male in NAD on vent Neuro: Awake, alert, oriented. Writes to communicate.  HEENT: Justice/aT, PERRL, no JVD Cardiovascular: Tachy, regular, no M/R/G.  Lungs: Respirations even and unlabored.  CTA bilaterally, No W/R/R.  Abdomen: PEG C/D/I.  BS x 4, soft, NT/ND.  Musculoskeletal: PICC since removed. NO acute deformity Skin: Intact, warm, no rashes.  Assessment & Plan:   Acute on chronic respiratory failure (with recent trach / vent dependence due to end stage COPD) - felt to be due to AECOPD and possible HCAP. - Continue full vent support. - SBT as tolerated, ATC if he will tolerate.  - Continue empiric abx and follow cultures. Check PCT - 40mg  solumedrol q12hrs. - DuoNebs / PRN Albuterol. - Bronchial hygiene.  Possible infection of RUE PICC. - PICC removed 5/26 - Empiric abx continue. Cultures pending  Agitation/Anxiety - He has been refusing seroquel and is asking to be taken off precedex because it makes him to sleepy. - DC precedex - PRN xanax low dose - DC seroquel per patient request.  - I have offered multiple medications to him such as SSRI to help control anxiety. He is concerned that these medications will interfere with his ability to get a lung transplant and is having [facility] sone [facility].   [facility] - this appears to have been prior to pt's trach placement (last office visit there was Jan 2020). - F[facility] as unsure whether he would still be a candidate for transplant.  Rest per primary team.  Best Practice:  Diet: Per TRH. Pain/Anxiety/Delirium protocol (if indicated): N/A. VAP protocol (if indicated): In place. DVT prophylaxis: Per TRH. GI prophylaxis: PPI. Glucose control: Per TR[facility]. Mobility: Bedrest. Code Status: Full. Family Communication: Attempted to call son, but no answer. Disposition: Progressive.  Labs   CBC: Recent Labs  Lab 11/28/18 2346 11/29/18 0015 11/29/18 0303 11/29/18 0458 11/30/18 0350  WBC 16.6*  --   --  14.8*  --   NEUTROABS 13.3*  --   --   --   --   HGB 10.3* 10.2* 10.2* 9.0* 9.9*  HCT 35.3* 30.0* 30.0* 30.7* 29.0*  MCV 92.2  --   --  91.6  --   PLT 460*  --   --  386  --     Basic Metabolic Panel: Recent Labs  Lab 11/28/18 2346 11/29/18 0015 11/29/18 0303 11/29/18 0458 11/29/18 1446 11/30/18 0350 11/30/18 0551  NA 140 137 139 139  --  137  --   K 4.7 4.7 3.8 4.1  --  4.3  --   CL 101  --   --  100  --   --   --   CO2 30  --   --  27  --   --   --   GLUCOSE 136*  --   --  179*  --   --   --   BUN 21  --   --  25*  --   --   --   CREATININE 0.51*  --   --  0.76  --   --   --   CALCIUM 9.7  --   --  9.7  --   --   --   MG  --   --   --   --  2.3  --  2.3  PHOS  --   --   --   --  3.9  --  4.0   GFR: Estimated Creatinine Clearance: 71.1 mL/min (by C-G formula based on SCr of 0.76 mg/dL). Recent Labs  Lab 11/28/18 2346 11/29/18 0458 11/29/18 1446  WBC 16.6* 14.8*  --   LATICACIDVEN 1.3  --  2.4*   Liver Function Tests: Recent Labs  Lab 11/28/18 2346  AST 18  ALT 19  ALKPHOS 84  BILITOT 0.3  PROT 7.2  ALBUMIN 3.5   No results for input(s): LIPASE, AMYLASE in the last 168 hours. No results for input(s): AMMONIA in the last 168 hours. ABG    Component Value Date/Time   PHART 7.361 11/30/2018 0350   PCO2ART 48.7 (H) 11/30/2018 0350   PO2ART 162.0 (H) 11/30/2018 0350   HCO3 27.7 11/30/2018 0350   TCO2 29 11/30/2018 0350   O2SAT 99.0 11/30/2018 0350    Coagulation Profile: No results for input(s): INR, PROTIME in the last 168 hours. Cardiac Enzymes: No results for input(s): CKTOTAL, CKMB, CKMBINDEX, TROPONINI in the last 168 hours. HbA1C: No results found for: HGBA1C CBG: Recent Labs  Lab 11/29/18 1606 11/29/18 1938 11/29/18 2341 11/30/18 0406 11/30/18 0749  GLUCAP 158* 143* 124* 143* 137*     Critical care time: 35 min.    Joneen Roach, AGACNP-BC [facility] Pulmonary/Critical Care Pager [phone] or [phone]  11/30/2018 10:41 AM

## 2018-12-01 DIAGNOSIS — J441 Chronic obstructive pulmonary disease with (acute) exacerbation: Secondary | ICD-10-CM

## 2018-12-01 DIAGNOSIS — F419 Anxiety disorder, unspecified: Secondary | ICD-10-CM

## 2018-12-01 LAB — BASIC METABOLIC PANEL
Anion gap: 8 (ref 5–15)
BUN: 26 mg/dL — ABNORMAL HIGH (ref 8–23)
CO2: 27 mmol/L (ref 22–32)
Calcium: 9.2 mg/dL (ref 8.9–10.3)
Chloride: 105 mmol/L (ref 98–111)
Creatinine, Ser: 0.53 mg/dL — ABNORMAL LOW (ref 0.61–1.24)
GFR calc Af Amer: >60 mL/min (ref >60)
GFR calc non Af Amer: >60 mL/min (ref >60)
Glucose, Bld: 135 mg/dL — ABNORMAL HIGH (ref 70–99)
Potassium: 4.4 mmol/L (ref 3.5–5.1)
Sodium: 140 mmol/L (ref 135–145)

## 2018-12-01 LAB — PHOSPHORUS: Phosphorus: 3.3 mg/dL (ref 2.5–4.6)

## 2018-12-01 LAB — CBC
HCT: 25.7 % — ABNORMAL LOW (ref 39.0–52.0)
Hemoglobin: 7.7 g/dL — ABNORMAL LOW (ref 13.0–17.0)
MCH: 26.6 pg (ref 26.0–34.0)
MCHC: 30 g/dL (ref 30.0–36.0)
MCV: 88.6 fL (ref 80.0–100.0)
Platelets: 312 10*3/uL (ref 150–400)
RBC: 2.9 MIL/uL — ABNORMAL LOW (ref 4.22–5.81)
RDW: 14.9 % (ref 11.5–15.5)
WBC: 8.2 10*3/uL (ref 4.0–10.5)
nRBC: 0 % (ref 0.0–0.2)

## 2018-12-01 LAB — GLUCOSE, CAPILLARY
Glucose-Capillary: 106 mg/dL — ABNORMAL HIGH (ref 70–99)
Glucose-Capillary: 120 mg/dL — ABNORMAL HIGH (ref 70–99)
Glucose-Capillary: 123 mg/dL — ABNORMAL HIGH (ref 70–99)
Glucose-Capillary: 127 mg/dL — ABNORMAL HIGH (ref 70–99)
Glucose-Capillary: 143 mg/dL — ABNORMAL HIGH (ref 70–99)
Glucose-Capillary: 92 mg/dL (ref 70–99)

## 2018-12-01 LAB — HEPATIC FUNCTION PANEL
ALT: 20 U/L (ref 0–44)
AST: 17 U/L (ref 15–41)
Albumin: 2.9 g/dL — ABNORMAL LOW (ref 3.5–5.0)
Alkaline Phosphatase: 51 U/L (ref 38–126)
Bilirubin, Direct: 0.1 mg/dL (ref 0.0–0.2)
Indirect Bilirubin: 0.3 mg/dL (ref 0.3–0.9)
Total Bilirubin: 0.4 mg/dL (ref 0.3–1.2)
Total Protein: 5.9 g/dL — ABNORMAL LOW (ref 6.5–8.1)

## 2018-12-01 LAB — MAGNESIUM: Magnesium: 2.1 mg/dL (ref 1.7–2.4)

## 2018-12-01 MED ORDER — ENOXAPARIN SODIUM 40 MG/0.4ML ~~LOC~~ SOLN
40.0000 mg | SUBCUTANEOUS | Status: DC
  Administered 2018-12-03 – 2018-12-14 (×12): 40 mg via SUBCUTANEOUS
  Filled 2018-12-01 (×13): qty 0.4

## 2018-12-01 MED ORDER — DIPHENHYDRAMINE HCL 12.5 MG/5ML PO ELIX
25.0000 mg | ORAL_SOLUTION | Freq: Every evening | ORAL | Status: DC | PRN
  Administered 2018-12-01 – 2018-12-14 (×6): 25 mg via ORAL
  Filled 2018-12-01 (×8): qty 10

## 2018-12-01 MED ORDER — INSULIN ASPART 100 UNIT/ML ~~LOC~~ SOLN
2.0000 [IU] | SUBCUTANEOUS | Status: DC
  Administered 2018-12-04: 05:00:00 2 [IU] via SUBCUTANEOUS

## 2018-12-01 NOTE — Progress Notes (Signed)
eLink Physician-Brief Progress Note Patient Name: Robert Pham DOB: 03/07/1951 MRN: [id number]   Date of Service  12/01/2018  HPI/Events of Note  Patient unable to sleep.  States he doesn't usually take any form of sleep aid.  eICU Interventions  Benadryl 25 mg prn for insomnia     Intervention Category Minor Interventions: Routine modifications to care plan (e.g. PRN medications for pain, fever)  Rosalie Gums Takao Lizer 12/01/2018, 2:38 AM

## 2018-12-01 NOTE — Clinical Social Work Note (Signed)
CSW was consulted. RNCM spoke with pt and pt would like to look at other SNF options at this time. In order for pt to be able to go to a different SNF pt would need to be on 28% trach, uncuffed. CSW will prepare options and present to pt.   [place], [place] [phone]

## 2018-12-01 NOTE — Care Management (Addendum)
Spoke to patient at bedside. He states that he is from [facility]. He is agreeable to them starting the process of taking him bad. Notified by Irving Burton that she will begin the process of finding an LTAC bed for him until a SNF bed opens up. She is hopeful they will have an LTAC bed for him as soon as tomorrow.  Consult placed to review other trach SNF options for patient. Spoke to [place] CSW, who will contact family.

## 2018-12-01 NOTE — Progress Notes (Signed)
Attempted PMSV with patient at this time. All air in cuff deflated, pt did not tolerate valve more than 1 minute. Coughing, anxiety. Pt did not desat, 02 94%. Pt HR inc from 89 to 106. PMSV removed, air placed back in cuff, pt suctioned. Pt resting now.

## 2018-12-01 NOTE — Evaluation (Signed)
Occupational Therapy Evaluation Patient Details Name: Robert Pham MRN: [id number] DOB: 12-08-1950 Today's Date: 12/01/2018    History of Present Illness 68 y.o. male with history of COPD with trach PEG and vent dependent [facility], hypertension presents to the ER with complaints of increasing shortness of breath over the last 3 days and has been having increasing bloody discharge over the last 1 to 2 weeks. Patient admitted from [facility], on chronic trach and vent dependent. Pt is nonverbal and unable to give history. Per chart review, trach was most likely placed sometime in March 2020.   Clinical Impression   [facility] Today Pt was able to perform transfers and hallway ambulation (Ambulated on 40% trach collar with sats >90%.) with Carley HammedEva walker with min to min guard assist +2 for safety with lines.  Decreased safety awareness and awareness of deficits. He does get anxious at times needing cues for safety and rest breaks.  He was able to don his socks Mod I at EOB, bed mobility independently, and min guard for standing ADL. Pt requires cues to slow down as he is VERY fast moving borderline impulsive. OT will follow acutely and Pt will require return to LTACH/[facility] post-acute.     Follow Up Recommendations  LTACH;SNF;Supervision/Assistance - 24 hour(return to [facility])    Equipment Recommendations  Other (comment)(defer to next venue of care)    Recommendations for Other Services       Precautions / Restrictions Precautions Precautions: Fall Restrictions Weight Bearing Restrictions: No      Mobility Bed Mobility Overal bed mobility: Independent                Transfers Overall transfer level: Needs assistance Equipment used: None Transfers: Sit to/from UGI CorporationStand;Stand Pivot Transfers Sit to Stand: Min guard Stand pivot transfers: Min guard       General transfer comment: Impulsive therefore needs cues for safety.     Balance Overall balance assessment: Needs assistance Sitting-balance support: No upper extremity supported;Feet supported Sitting balance-Leahy Scale: Good     Standing balance support: No upper extremity supported;During functional activity Standing balance-Leahy Scale: Fair Standing balance comment: can stand staticaly without UE support                           ADL either performed or assessed with clinical judgement   ADL Overall ADL's : Needs assistance/impaired Eating/Feeding: NPO   Grooming: Modified independent;Sitting   Upper Body Bathing: Minimal assistance   Lower Body Bathing: Minimal assistance   Upper Body Dressing : Minimal assistance   Lower Body Dressing: Min guard Lower Body Dressing Details (indicate cue type and reason): to don socks EOB Toilet Transfer: Min guard;Ambulation(eva walker)   Toileting- Clothing Manipulation and Hygiene: Min guard;Sit to/from stand       Functional mobility during ADLs: Min guard;+2 for safety/equipment;Rolling walker;Cueing for safety General ADL Comments: Pt VERY quick moving, good strength and flexibility     Vision         Perception     Praxis      Pertinent Vitals/Pain Pain Assessment: No/denies pain     Hand Dominance     Extremity/Trunk Assessment Upper Extremity Assessment Upper Extremity Assessment: Overall WFL for tasks assessed   Lower Extremity Assessment Lower Extremity Assessment: Defer to PT evaluation   Cervical / Trunk Assessment Cervical / Trunk Assessment: Kyphotic   Communication Communication Communication: Other (comment)(writes on  clipboard due to trach/vent)   Cognition Arousal/Alertness: Awake/alert Behavior During Therapy: WFL for tasks assessed/performed Overall Cognitive Status: Within Functional Limits for tasks assessed                                     General Comments       Exercises     Shoulder Instructions      Home Living  Family/patient expects to be discharged to:: Skilled nursing facility Living Arrangements: Children(son) Available Help at Discharge: Family;Available 24 hours/day Type of Home: Mobile home Home Access: Stairs to enter Entrance Stairs-Number of Steps: 5 Entrance Stairs-Rails: Right;Left;Can reach both Home Layout: One level     Bathroom Shower/Tub: Chief Strategy Officer: Standard     Home Equipment: Emergency planning/management officer - 2 wheels;Wheelchair - manual          Prior Functioning/Environment Level of Independence: Independent with assistive device(s)        Comments: Pt states he used the wheelchair vs. rW depending on how he felt        OT Problem List: Decreased activity tolerance;Decreased safety awareness;Decreased knowledge of use of DME or AE;Decreased knowledge of precautions;Cardiopulmonary status limiting activity      OT Treatment/Interventions: Self-care/ADL training;Energy conservation;DME and/or AE instruction;Therapeutic activities;Patient/family education;Balance training    OT Goals(Current goals can be found in the care plan section) Acute Rehab OT Goals Patient Stated Goal: to get better OT Goal Formulation: With patient Time For Goal Achievement: 12/08/18 Potential to Achieve Goals: Good ADL Goals Pt Will Perform Grooming: with modified independence;standing Pt Will Perform Upper Body Bathing: with modified independence;standing Pt Will Perform Lower Body Bathing: with modified independence;sit to/from stand Pt Will Transfer to Toilet: with modified independence;ambulating Pt Will Perform Toileting - Clothing Manipulation and hygiene: with modified independence;sit to/from stand  OT Frequency: Min 2X/week   Barriers to D/C:            Co-evaluation PT/OT/SLP Co-Evaluation/Treatment: Yes Reason for Co-Treatment: Complexity of the patient's impairments (multi-system involvement);For patient/therapist safety PT goals addressed during  session: Mobility/safety with mobility OT goals addressed during session: ADL's and self-care;Proper use of Adaptive equipment and DME      AM-PAC OT "6 Clicks" Daily Activity     Outcome Measure Help from another person eating meals?: Total(NPO) Help from another person taking care of personal grooming?: A Little Help from another person toileting, which includes using toliet, bedpan, or urinal?: A Little Help from another person bathing (including washing, rinsing, drying)?: A Little Help from another person to put on and taking off regular upper body clothing?: A Little Help from another person to put on and taking off regular lower body clothing?: A Little 6 Click Score: 16   End of Session Equipment Utilized During Treatment: Gait belt;Oxygen;Other (comment)(EVA walker; trach collar (40%, 10L)) Nurse Communication: Mobility status  Activity Tolerance: Patient tolerated treatment well Patient left: in chair;with call bell/phone within reach  OT Visit Diagnosis: Other abnormalities of gait and mobility (R26.89);Muscle weakness (generalized) (M62.81)                Time: 6834-1962 OT Time Calculation (min): 33 min Charges:  OT General Charges $OT Visit: 1 Visit OT Evaluation $OT Eval Moderate Complexity: 1 Mod OT Treatments $Self Care/Home Management : 8-22 mins  Sherryl Manges OTR/L Acute Rehabilitation Services Pager: [phone] Office: [phone]  Evern Bio Tammee Thielke 12/01/2018, 5:19 PM

## 2018-12-01 NOTE — Progress Notes (Signed)
Placed pt on 10L/40% ATC per MD request. Pt very anxious but was reassured he was ok and we are taking good care of him and will not let him get into trouble with his breathing. RN aware

## 2018-12-01 NOTE — Progress Notes (Signed)
Physical Therapy Treatment Patient Details Name: Robert Pham MRN: [id number] DOB: [birth year] Today's Date: 12/01/2018    History of Present Illness 68 y.o. male with history of COPD with trach PEG and vent dependent [facility], hypertension presents to the ER with complaints of increasing shortness of breath over the last 3 days and has been having increasing bloody discharge over the last 1 to 2 weeks. Patient admitted from [facility], on chronic trach and vent dependent. Pt is nonverbal and unable to give history. Per chart review, trach was most likely placed sometime in March 2020.    PT Comments    Pt admitted with above diagnosis. Pt currently with functional limitations due to the deficits listed below (see PT Problem List). Pt was able to ambulate with Carley HammedEva walker with min to min guard assist 100 feet x 2.  Fatigues and does get anxious at times needing cues for safety but is progressing.  Ambulated on 40% trach collar with sats >90%.   Pt will benefit from skilled PT to increase their independence and safety with mobility to allow discharge to the venue listed below.     Follow Up Recommendations  LTACH;Supervision/Assistance - 24 hour(Back to [facility])     Equipment Recommendations  None recommended by PT    Recommendations for Other Services       Precautions / Restrictions Precautions Precautions: Fall Restrictions Weight Bearing Restrictions: No    Mobility  Bed Mobility Overal bed mobility: Independent                Transfers Overall transfer level: Needs assistance Equipment used: None Transfers: Sit to/from UGI CorporationStand;Stand Pivot Transfers Sit to Stand: Min guard Stand pivot transfers: Min guard       General transfer comment: Impulsive therefore needs cues for safety.   Ambulation/Gait Ambulation/Gait assistance: Min guard;Min assist;+2 safety/equipment Gait Distance (Feet): 200 Feet(100 feet x 2) Assistive device: (Eva  walker) Gait Pattern/deviations: Step-through pattern;Decreased stride length   Gait velocity interpretation: 1.31 - 2.62 ft/sec, indicative of limited community ambulator General Gait Details: Pt was able to ambualte with Carley HammedEva walker in hallway.  Pt with overall good steady gait.  Did take a few standing rest breaks and 1 sitting rest break.  FiO2 was 40% trach collar with sats >90%.  Other VSS.    Stairs             Wheelchair Mobility    Modified Rankin (Stroke Patients Only)       Balance Overall balance assessment: Needs assistance Sitting-balance support: No upper extremity supported;Feet supported Sitting balance-Leahy Scale: Good     Standing balance support: No upper extremity supported;During functional activity Standing balance-Leahy Scale: Fair Standing balance comment: can stand staticaly without UE support                            Cognition Arousal/Alertness: Awake/alert Behavior During Therapy: WFL for tasks assessed/performed Overall Cognitive Status: Within Functional Limits for tasks assessed                                        Exercises General Exercises - Lower Extremity Long Arc Quad: AROM;Both;10 reps;Seated    General Comments        Pertinent Vitals/Pain Pain Assessment: No/denies pain    Home Living  Prior Function            PT Goals (current goals can now be found in the care plan section) Acute Rehab PT Goals Patient Stated Goal: to get better Progress towards PT goals: Progressing toward goals    Frequency    Min 3X/week      PT Plan Current plan remains appropriate    Co-evaluation PT/OT/SLP Co-Evaluation/Treatment: Yes Reason for Co-Treatment: Complexity of the patient's impairments (multi-system involvement);For patient/therapist safety PT goals addressed during session: Mobility/safety with mobility        AM-PAC PT "6 Clicks" Mobility    Outcome Measure  Help needed turning from your back to your side while in a flat bed without using bedrails?: None Help needed moving from lying on your back to sitting on the side of a flat bed without using bedrails?: None Help needed moving to and from a bed to a chair (including a wheelchair)?: None Help needed standing up from a chair using your arms (e.g., wheelchair or bedside chair)?: A Little Help needed to walk in hospital room?: A Little Help needed climbing 3-5 steps with a railing? : A Little 6 Click Score: 21    End of Session Equipment Utilized During Treatment: Gait belt;Oxygen(trach collar 40%) Activity Tolerance: Patient tolerated treatment well Patient left: in chair;with call bell/phone within reach Nurse Communication: Mobility status PT Visit Diagnosis: Unsteadiness on feet (R26.81);Muscle weakness (generalized) (M62.81)     Time: 5366-4403 PT Time Calculation (min) (ACUTE ONLY): 23 min  Charges:  $Gait Training: 8-22 mins                     Olof Marcil,PT Acute Rehabilitation Services Pager:  [phone]  Office:  [phone]     Berline Lopes 12/01/2018, 1:38 PM

## 2018-12-01 NOTE — TOC Initial Note (Signed)
Transition of Care [facility]) - Initial/Assessment Note    Patient Details  Name: Robert Pham MRN: [id number] Date of Birth: 1950-08-12  [facility] CM/SW Contact:    Maree Krabbe, LCSW Phone Number: 12/01/2018, 4:06 PM  Clinical Narrative:    CSW spoke with pt at bedside. Pt has a trach. Pt was at [facility] prior to admission. Pt does not want to go back to [facility].  CSW provided pt with a list of SNF that take trach. Pt trach will have to be on 28% and uncuffed prior to d/c to new SNF. [facility] will also have to be 23 days old prior to d/c. Pt is to look over list and CSW will follow up with pt regarding choice.             Expected Discharge Plan: Skilled Nursing Facility Barriers to Discharge: Continued Medical Work up   Patient Goals and CMS Choice Patient states their goals for this hospitalization and ongoing recovery are:: "to get to another SNF" CMS Medicare.gov Compare Post Acute Care list provided to:: Patient Choice offered to / list presented to : Patient  Expected Discharge Plan and Services Expected Discharge Plan: Skilled Nursing Facility In-house Referral: Clinical Social Work Discharge Planning Services: NA Post Acute Care Choice: Skilled Nursing Facility Living arrangements for the past 2 months: Skilled Nursing Facility Expected Discharge Date: 12/06/18               DME Arranged: N/A         HH Arranged: NA          Prior Living Arrangements/Services Living arrangements for the past 2 months: Skilled Nursing Facility Lives with:: Facility Resident, Self Patient language and need for interpreter reviewed:: Yes Do you feel safe going back to the place where you live?: Yes      Need for Family Participation in Patient Care: Yes (Comment) Care giver support system in place?: Yes (comment)   Criminal Activity/Legal Involvement Pertinent to Current Situation/Hospitalization: No - Comment as needed  Activities of Daily Living Home  Assistive Devices/Equipment: None ADL Screening (condition at time of admission) Patient's cognitive ability adequate to safely complete daily activities?: Yes Is the patient deaf or have difficulty hearing?: No Does the patient have difficulty seeing, even when wearing glasses/contacts?: No Does the patient have difficulty concentrating, remembering, or making decisions?: No Patient able to express need for assistance with ADLs?: Yes Does the patient have difficulty dressing or bathing?: Yes Independently performs ADLs?: Yes (appropriate for developmental age) Does the patient have difficulty walking or climbing stairs?: Yes Weakness of Legs: None Weakness of Arms/Hands: None  Permission Sought/Granted   Permission granted to share information with : Yes, Verbal Permission Granted              Emotional Assessment Appearance:: Appears stated age Attitude/Demeanor/Rapport: (Pt was appropriate) Affect (typically observed): Accepting, Appropriate, Calm Orientation: : Oriented to Self, Oriented to Place, Oriented to  Time, Oriented to Situation Alcohol / Substance Use: Not Applicable Psych Involvement: No (comment)  Admission diagnosis:  Respiratory failure (HCC) [J96.90] COPD exacerbation (HCC) [J44.1] HCAP (healthcare-associated pneumonia) [J18.9] Sepsis with acute hypercapnic respiratory failure without septic shock, due to unspecified organism (HCC) [A41.9, R65.20, J96.02] Acute respiratory failure with hypoxia (HCC) [J96.01] Patient Active Problem List   Diagnosis Date Noted  . Acute respiratory failure with hypoxia (HCC) 11/29/2018  . COPD exacerbation (HCC) 11/29/2018  . Essential hypertension 11/29/2018  . Pressure injury of skin 11/29/2018  PCP:  Crist Fat, MD Pharmacy:  No Pharmacies Listed    Social Determinants of Health (SDOH) Interventions    Readmission Risk Interventions No flowsheet data found.

## 2018-12-01 NOTE — Progress Notes (Signed)
PCCM Brief Progress Note  I spoke with the patient's son Lucus and provided daily updates. All questions answered. Emotional support provided.   Tessie Fass MSN, AGACNP-BC [place] Pulmonary/Critical Care Medicine [phone] If no answer, [phone] 12/01/2018, 1:52 PM

## 2018-12-01 NOTE — Progress Notes (Signed)
NAME:  Robert Pham, MRN:  [id number], DOB:  09/02/1950, LOS: 2 ADMISSION DATE:  11/28/2018, CONSULTATION DATE:  11/23/18 REFERRING MD:  Eudelia Bunch  CHIEF COMPLAINT:  Vent Management   Brief History   Robert Pham is a 68 y.o. male with trach / PEG from [facility] who was admitted 5/26 with dyspnea felt to be due to COPD exacerbation.  Past Medical History  Chronic tracheostomy / vent dependence, end stage COPD (had been worked up for transplant at [facility], last seen Jan 2020 but appears to be prior to tracheostomy placement), HTN, HLD, GERD, anxiety, MDD.  Significant Hospital Events   5/26 > admit.  Consults:  PCCM.  Procedures:  None.  Significant Diagnostic Tests:  CXR 5/26 > patchy infiltrates, hyperinflation.  Micro Data:  Blood 5/26 >  Methicillin resistant coag negative staph, likely contaminant  RVP 5/26 > neg SARS CoV2 5/26 > negative.   Antimicrobials:  Vanc 5/25 > 5/28 Cefepime 5/25 >    Interim history/subjective:  Patient endorses poor sleep and ongoing anxiety He is weaning his vent well this morning, PSV/CPAP transitioning to trach collar   Objective:  Blood pressure (!) 118/95, pulse 80, temperature 98.1 F (36.7 C), temperature source Oral, resp. rate (!) 23, height 5\' 5"  (1.651 m), weight 56.9 kg, SpO2 100 %.    Vent Mode: PCV FiO2 (%):  [40 %] 40 % Set Rate:  [14 bmp] 14 bmp PEEP:  [5 cmH20] 5 cmH20 Pressure Support:  [5 cmH20-12 cmH20] 5 cmH20 Plateau Pressure:  [16 cmH20-26 cmH20] 24 cmH20   Intake/Output Summary (Last 24 hours) at 12/01/2018 0915 Last data filed at 12/01/2018 0800 Gross per 24 hour  Intake 2443.55 ml  Output 1350 ml  Net 1093.55 ml   Filed Weights   11/29/18 0115 11/30/18 0500 12/01/18 0500  Weight: 56.7 kg 56.9 kg 56.9 kg    Examination:  General: Chronically ill appearing M, trach/vent seated in bed NAD Neuro: Awake alert, interactive. Writing to communicate. No focal deficits  HEENT: NCAT Trach secure. Trachea midline.  Anicteric sclera  Cardiovascular: RRR s1s2 norgm  Lungs: CTA bilaterally. Symmetrical chest excursion. No accessory muscle recruitment or increased WOB on PSV/CPAP Abdomen: PEG site c/d/i. Abdomen soft round. Bowel sounds x4  Musculoskeletal: No obvious joint abnormality. No pedal edema. No cyanosis.  Skin: Clean, dry, warm, without rash   Assessment & Plan:   Acute on chronic respiratory failure (with recent trach / vent dependence due to end stage COPD) - felt to be due to AECOPD and possible HCAP. - Weaning vent well 5/28, if able to tolerate PSV/CPAP, can try Trach Collar  - Solumedrol 40 q12 started 5/26. Wean moving forward.  - DuoNebs / PRN Albuterol. - Pulm hygiene - AM CXR   Possible infection of RUE PICC. - PICC removed 5/26 - Discontinuing vanc (BCx result likely contaminant) Continue Cefepime   Anxiety, Insomnia - PRN xanax low dose -Patient refusing precedex and other anxiolysis  -Nursing interventions for calm environment, sleep hygiene, delirium prevention  -PRN benadryl qHS for insomnia   Anemia -Transfuse per unit protocol   HTN -continue lisinopril -Continue tele   BPH -proscar  Hyperglycemia -SSI  Deconditioning Inadequate PO intake -EN per RDN -Consult PT for assessment and intervention. Patient requesting ambulation.     Consideration for lung transplant at [facility] - this appears to have been prior to pt's trach placement (last office visit there was Jan 2020). - F/u with [facility] after discharge from [facility] as unsure whether he  would still be a candidate for transplant.   Best Practice:  Diet: EN  Pain/Anxiety/Delirium protocol (if indicated): N/A. VAP protocol (if indicated): In place. DVT prophylaxis: lovenox  GI prophylaxis: PPI. Glucose control:  Mobility: Bedrest. Code Status: Full. Family Communication: Attempted to contact, directed to voicemail  Disposition: In ICU   Labs   CBC: Recent Labs  Lab 11/28/18 2346 11/29/18 0015 11/29/18  0303 11/29/18 0458 11/30/18 0350 12/01/18 0317  WBC 16.6*  --   --  14.8*  --  8.2  NEUTROABS 13.3*  --   --   --   --   --   HGB 10.3* 10.2* 10.2* 9.0* 9.9* 7.7*  HCT 35.3* 30.0* 30.0* 30.7* 29.0* 25.7*  MCV 92.2  --   --  91.6  --  88.6  PLT 460*  --   --  386  --  312   Basic Metabolic Panel: Recent Labs  Lab 11/28/18 2346 11/29/18 0015 11/29/18 0303 11/29/18 0458 11/29/18 1446 11/30/18 0350 11/30/18 0551 12/01/18 0317  NA 140 137 139 139  --  137  --  140  K 4.7 4.7 3.8 4.1  --  4.3  --  4.4  CL 101  --   --  100  --   --   --  105  CO2 30  --   --  27  --   --   --  27  GLUCOSE 136*  --   --  179*  --   --   --  135*  BUN 21  --   --  25*  --   --   --  26*  CREATININE 0.51*  --   --  0.76  --   --   --  0.53*  CALCIUM 9.7  --   --  9.7  --   --   --  9.2  MG  --   --   --   --  2.3  --  2.3 2.1  PHOS  --   --   --   --  3.9  --  4.0 3.3   GFR: Estimated Creatinine Clearance: 71.1 mL/min (A) (by C-G formula based on SCr of 0.53 mg/dL (L)). Recent Labs  Lab 11/28/18 2346 11/29/18 0458 11/29/18 1446 12/01/18 0317  WBC 16.6* 14.8*  --  8.2  LATICACIDVEN 1.3  --  2.4*  --    Liver Function Tests: Recent Labs  Lab 11/28/18 2346 12/01/18 0317  AST 18 17  ALT 19 20  ALKPHOS 84 51  BILITOT 0.3 0.4  PROT 7.2 5.9*  ALBUMIN 3.5 2.9*   No results for input(s): LIPASE, AMYLASE in the last 168 hours. No results for input(s): AMMONIA in the last 168 hours. ABG    Component Value Date/Time   PHART 7.361 11/30/2018 0350   PCO2ART 48.7 (H) 11/30/2018 0350   PO2ART 162.0 (H) 11/30/2018 0350   HCO3 27.7 11/30/2018 0350   TCO2 29 11/30/2018 0350   O2SAT 99.0 11/30/2018 0350    Coagulation Profile: No results for input(s): INR, PROTIME in the last 168 hours. Cardiac Enzymes: No results for input(s): CKTOTAL, CKMB, CKMBINDEX, TROPONINI in the last 168 hours. HbA1C: No results found for: HGBA1C CBG: Recent Labs  Lab 11/30/18 0749 11/30/18 2042 12/01/18  0007 12/01/18 0412 12/01/18 0756  GLUCAP 137* 105* 123* 127* 143*     Critical care time: 30 minutes    Tessie FassGrace Mackenzy Grumbine MSN, AGACNP-BC [facility] Pulmonary/Critical Care Medicine [phone]  If no answer, [phone] 12/01/2018, 9:15 AM

## 2018-12-01 NOTE — Progress Notes (Signed)
Spoke with patient and his son, Mellody Dance. They are both adamant about NOT returning to [facility]. Pt is on ATC at this time, ambulating around ICU. Pt had [employer]/Medicare insurance. RN paged case Production designer, theatre/television/film. RN will continue to monitor.

## 2018-12-01 NOTE — Progress Notes (Signed)
Foley catheter removed at 1315.

## 2018-12-02 ENCOUNTER — Inpatient Hospital Stay (HOSPITAL_COMMUNITY): Payer: Medicare Other

## 2018-12-02 DIAGNOSIS — R7881 Bacteremia: Secondary | ICD-10-CM

## 2018-12-02 DIAGNOSIS — J449 Chronic obstructive pulmonary disease, unspecified: Secondary | ICD-10-CM

## 2018-12-02 LAB — BASIC METABOLIC PANEL
Anion gap: 10 (ref 5–15)
BUN: 23 mg/dL (ref 8–23)
CO2: 28 mmol/L (ref 22–32)
Calcium: 9.3 mg/dL (ref 8.9–10.3)
Chloride: 103 mmol/L (ref 98–111)
Creatinine, Ser: 0.45 mg/dL — ABNORMAL LOW (ref 0.61–1.24)
GFR calc Af Amer: >60 mL/min (ref >60)
GFR calc non Af Amer: >60 mL/min (ref >60)
Glucose, Bld: 125 mg/dL — ABNORMAL HIGH (ref 70–99)
Potassium: 4.2 mmol/L (ref 3.5–5.1)
Sodium: 141 mmol/L (ref 135–145)

## 2018-12-02 LAB — CBC
HCT: 28.2 % — ABNORMAL LOW (ref 39.0–52.0)
Hemoglobin: 8.4 g/dL — ABNORMAL LOW (ref 13.0–17.0)
MCH: 26.8 pg (ref 26.0–34.0)
MCHC: 29.8 g/dL — ABNORMAL LOW (ref 30.0–36.0)
MCV: 90.1 fL (ref 80.0–100.0)
Platelets: 330 10*3/uL (ref 150–400)
RBC: 3.13 MIL/uL — ABNORMAL LOW (ref 4.22–5.81)
RDW: 14.6 % (ref 11.5–15.5)
WBC: 9.7 10*3/uL (ref 4.0–10.5)
nRBC: 0 % (ref 0.0–0.2)

## 2018-12-02 LAB — GLUCOSE, CAPILLARY
Glucose-Capillary: 102 mg/dL — ABNORMAL HIGH (ref 70–99)
Glucose-Capillary: 109 mg/dL — ABNORMAL HIGH (ref 70–99)
Glucose-Capillary: 115 mg/dL — ABNORMAL HIGH (ref 70–99)
Glucose-Capillary: 122 mg/dL — ABNORMAL HIGH (ref 70–99)
Glucose-Capillary: 91 mg/dL (ref 70–99)
Glucose-Capillary: 99 mg/dL (ref 70–99)

## 2018-12-02 LAB — CULTURE, BLOOD (ROUTINE X 2)

## 2018-12-02 LAB — MAGNESIUM: Magnesium: 2.1 mg/dL (ref 1.7–2.4)

## 2018-12-02 LAB — PHOSPHORUS: Phosphorus: 3.1 mg/dL (ref 2.5–4.6)

## 2018-12-02 MED ORDER — ATORVASTATIN CALCIUM 10 MG PO TABS
20.0000 mg | ORAL_TABLET | Freq: Every day | ORAL | Status: DC
  Administered 2018-12-02 – 2018-12-13 (×8): 20 mg via ORAL
  Filled 2018-12-02 (×9): qty 2

## 2018-12-02 MED ORDER — METHYLPREDNISOLONE SODIUM SUCC 40 MG IJ SOLR
40.0000 mg | Freq: Every day | INTRAMUSCULAR | Status: AC
  Administered 2018-12-02 – 2018-12-03 (×2): 40 mg via INTRAVENOUS
  Filled 2018-12-02 (×2): qty 1

## 2018-12-02 MED ORDER — CLONIDINE HCL 0.1 MG PO TABS
0.1000 mg | ORAL_TABLET | Freq: Every evening | ORAL | Status: DC | PRN
  Administered 2018-12-02 – 2018-12-13 (×5): 0.1 mg via ORAL
  Filled 2018-12-02 (×6): qty 1

## 2018-12-02 MED ORDER — ALPRAZOLAM 0.5 MG PO TABS
0.5000 mg | ORAL_TABLET | Freq: Three times a day (TID) | ORAL | Status: DC | PRN
  Administered 2018-12-02 – 2018-12-06 (×14): 0.5 mg via ORAL
  Filled 2018-12-02 (×15): qty 1

## 2018-12-02 NOTE — Progress Notes (Addendum)
NAME:  Robert Pham, MRN:  [id number], DOB:  1951/06/02, LOS: 3 ADMISSION DATE:  11/28/2018, CONSULTATION DATE:  11/23/18 REFERRING MD:  Eudelia Bunch  CHIEF COMPLAINT:  Vent Management   Brief History   Robert Pham is a 68 y.o. male with trach / PEG from [facility] who was admitted 5/26 with dyspnea felt to be due to COPD exacerbation.  Past Medical History  Chronic tracheostomy / vent dependence, end stage COPD (had been worked up for transplant at [facility], last seen Jan 2020 but appears to be prior to tracheostomy placement), HTN, HLD, GERD, anxiety, MDD.  Significant Hospital Events   5/26 > admit.  Consults:  PCCM.  Procedures:  None.  Significant Diagnostic Tests:  CXR 5/26 > patchy infiltrates, hyperinflation.  Micro Data:  Blood 5/26 >  Methicillin resistant coag negative staph, likely contaminant  RVP 5/26 > neg SARS CoV2 5/26 > negative.   Antimicrobials:  Vanc 5/25 > 5/28 Cefepime 5/25 >    Interim history/subjective:  TCT most of day 5/28 Placed back on full vent support overnight  Objective:  Blood pressure 133/86, pulse 93, temperature 98.2 F (36.8 C), temperature source Oral, resp. rate (!) 24, height  (1.651 m), weight 56.9 kg, SpO2 97 %.    Vent Mode: PCV FiO2 (%):  [35 %-40 %] 40 % Set Rate:  [14 bmp] 14 bmp PEEP:  [5 cmH20] 5 cmH20 Plateau Pressure:  [17 cmH20-25 cmH20] 25 cmH20   Intake/Output Summary (Last 24 hours) at 12/02/2018 0831 Last data filed at 12/02/2018 0700 Gross per 24 hour  Intake 1523.26 ml  Output 1900 ml  Net -376.74 ml   Filed Weights   11/30/18 0500 12/01/18 0500 12/02/18 0500  Weight: 56.9 kg 56.9 kg 56.9 kg    Examination:  General: Chronically ill appearing adult male, trach/vent, NAD  Neuro: AAO, interactive, following commands, PERRL  HEENT: NCAT trach secure. Pink mmm, trachea midline  Cardiovascular: RRR s1s2 no rgm  Lungs: RUL crackles. Symmetrical excursion. No accessory muscle recruitment or increased WOB  Abdomen: Abdomen soft, round. + bowel sounds. PEG site clean.   Musculoskeletal: No obvious joint deformity. No peripheral edema. No cyanosis or clubbing.  Skin: Clean, dry, warm, without rash   Assessment & Plan:   Acute on chronic respiratory failure -trach/vent dependence due to AECOPD and possible HCAP  -Weaned to trach collar much of day 5/28, placed on vent overnight -CXR 5/29 bibasilar opacities, likely atelectasis possible infiltrate P -Continue PSV/CPAP and Trach collar as tolerated  - Solumedrol 40 qD  - Continue DuoNebs / PRN Albuterol - Continue Pulm hygiene - Adding CPT - AM CXR   Possible infection of RUE PICC. - PICC removed 5/26 - Discontinuing vanc (BCx result likely contaminant) Continue Cefepime   Anxiety, Insomnia -patient transferred to ICU for precedex. Patient refusing precedex P - weaning steroids - PRN xanax low dose -Nursing interventions for calm environment, sleep hygiene, delirium prevention  -PRN benadryl qHS for insomnia, has not been effective.  -Will add low-dose clonidine for insomnia  Anemia -Transfuse per unit protocol   HTN -continue lisinopril -Continue tele   BPH -proscar  Hyperglycemia -SSI  Deconditioning Inadequate PO intake -EN per RDN -PT/OT    Consideration for lung transplant at [facility] - this appears to have been prior to pt's trach placement (last office visit there was Jan 2020). - F/u with [facility] after discharge from [facility] as unsure whether he would still be a candidate for transplant.   Best Practice:  Diet: EN  Pain/Anxiety/Delirium protocol (if indicated): PRN Xanax  VAP protocol (if indicated): In place. DVT prophylaxis: lovenox  GI prophylaxis: PPI. Glucose control: SSI Mobility: PT/OT, up with assistance  Code Status: Full. Family Communication: Directed to voicemail  Disposition: Remains in ICU. Case Management assisting in placement-- patient came from [facility] but is refusing to return.   Labs    CBC: Recent Labs  Lab 11/28/18 2346  11/29/18 0303 11/29/18 0458 11/30/18 0350 12/01/18 0317 12/02/18 0152  WBC 16.6*  --   --  14.8*  --  8.2 9.7  NEUTROABS 13.3*  --   --   --   --   --   --   HGB 10.3*   < > 10.2* 9.0* 9.9* 7.7* 8.4*  HCT 35.3*   < > 30.0* 30.7* 29.0* 25.7* 28.2*  MCV 92.2  --   --  91.6  --  88.6 90.1  PLT 460*  --   --  386  --  312 330   < > = values in this interval not displayed.   Basic Metabolic Panel: Recent Labs  Lab 11/28/18 2346  11/29/18 0303 11/29/18 0458 11/29/18 1446 11/30/18 0350 11/30/18 0551 12/01/18 0317 12/02/18 0152  NA 140   < > 139 139  --  137  --  140 141  K 4.7   < > 3.8 4.1  --  4.3  --  4.4 4.2  CL 101  --   --  100  --   --   --  105 103  CO2 30  --   --  27  --   --   --  27 28  GLUCOSE 136*  --   --  179*  --   --   --  135* 125*  BUN 21  --   --  25*  --   --   --  26* 23  CREATININE 0.51*  --   --  0.76  --   --   --  0.53* 0.45*  CALCIUM 9.7  --   --  9.7  --   --   --  9.2 9.3  MG  --   --   --   --  2.3  --  2.3 2.1 2.1  PHOS  --   --   --   --  3.9  --  4.0 3.3 3.1   < > = values in this interval not displayed.   GFR: Estimated Creatinine Clearance: 71.1 mL/min (A) (by C-G formula based on SCr of 0.45 mg/dL (L)). Recent Labs  Lab 11/28/18 2346 11/29/18 0458 11/29/18 1446 12/01/18 0317 12/02/18 0152  WBC 16.6* 14.8*  --  8.2 9.7  LATICACIDVEN 1.3  --  2.4*  --   --    Liver Function Tests: Recent Labs  Lab 11/28/18 2346 12/01/18 0317  AST 18 17  ALT 19 20  ALKPHOS 84 51  BILITOT 0.3 0.4  PROT 7.2 5.9*  ALBUMIN 3.5 2.9*   No results for input(s): LIPASE, AMYLASE in the last 168 hours. No results for input(s): AMMONIA in the last 168 hours. ABG    Component Value Date/Time   PHART 7.361 11/30/2018 0350   PCO2ART 48.7 (H) 11/30/2018 0350   PO2ART 162.0 (H) 11/30/2018 0350   HCO3 27.7 11/30/2018 0350   TCO2 29 11/30/2018 0350   O2SAT 99.0 11/30/2018 0350    Coagulation Profile: No results  for input(s): INR, PROTIME in the last  168 hours. Cardiac Enzymes: No results for input(s): CKTOTAL, CKMB, CKMBINDEX, TROPONINI in the last 168 hours. HbA1C: No results found for: HGBA1C CBG: Recent Labs  Lab 12/01/18 1602 12/01/18 2042 12/02/18 0024 12/02/18 0425 12/02/18 0805  GLUCAP 120* 106* 115* 109* 122*        Tessie Fass MSN, AGACNP-BC [place] Pulmonary/Critical Care Medicine [phone] If no answer, [phone] 12/02/2018, 8:31 AM

## 2018-12-02 NOTE — Progress Notes (Signed)
Patient was observed removing ECG chord from the monitor. Patient was also observed putting tape around the oxygen probe. Patient was educated on the importance of leaving equipment in place.

## 2018-12-02 NOTE — Progress Notes (Signed)
Patient was observed by RN and tech attempting to get out of bed without assistance and without yellow slip socks. Nurse educated patient on the importance of waiting for assistance to get out of bed and wearing slip resistant socks. Patient nodded in understanding. Patient was later observed attempting to get out of bed without assistance.

## 2018-12-02 NOTE — Progress Notes (Signed)
Patient was observed leaning over the bed and raising the height of the bed. Patient was educated on the importance of keeping the bed in low position.

## 2018-12-02 NOTE — Progress Notes (Signed)
Late Note: RT placed pt back on full support ventilation for the night at 2106. Pt cuff inflated to 28 cm H2O prior to placing pt on vent. Pt tolerating well. RT will continue to monitor.

## 2018-12-02 NOTE — Progress Notes (Signed)
Patient was observed by RT manipulating PEG tube so that it would leak. RT told patient to ask for assistance. Nurse and tech changed linens and gown of patient.

## 2018-12-02 NOTE — Progress Notes (Signed)
RT attempted CPT on pt and pt refused told the RT she was an idiot and she did not know what she is doing. Offered to suction pt and pt declined. RT will continue to monitor.

## 2018-12-02 NOTE — Progress Notes (Signed)
Pt had bloody 47ml urine output at this time in urinal. Pt has had clear, yellow voids earlier today. RN will continue to monitor.

## 2018-12-03 ENCOUNTER — Inpatient Hospital Stay (HOSPITAL_COMMUNITY): Payer: Medicare Other

## 2018-12-03 LAB — BASIC METABOLIC PANEL
Anion gap: 10 (ref 5–15)
BUN: 21 mg/dL (ref 8–23)
CO2: 30 mmol/L (ref 22–32)
Calcium: 9.3 mg/dL (ref 8.9–10.3)
Chloride: 101 mmol/L (ref 98–111)
Creatinine, Ser: 0.5 mg/dL — ABNORMAL LOW (ref 0.61–1.24)
GFR calc Af Amer: >60 mL/min (ref >60)
GFR calc non Af Amer: >60 mL/min (ref >60)
Glucose, Bld: 93 mg/dL (ref 70–99)
Potassium: 3.9 mmol/L (ref 3.5–5.1)
Sodium: 141 mmol/L (ref 135–145)

## 2018-12-03 LAB — CBC
HCT: 31.1 % — ABNORMAL LOW (ref 39.0–52.0)
Hemoglobin: 9.1 g/dL — ABNORMAL LOW (ref 13.0–17.0)
MCH: 26.2 pg (ref 26.0–34.0)
MCHC: 29.3 g/dL — ABNORMAL LOW (ref 30.0–36.0)
MCV: 89.6 fL (ref 80.0–100.0)
Platelets: 382 10*3/uL (ref 150–400)
RBC: 3.47 MIL/uL — ABNORMAL LOW (ref 4.22–5.81)
RDW: 14.5 % (ref 11.5–15.5)
WBC: 12.4 10*3/uL — ABNORMAL HIGH (ref 4.0–10.5)
nRBC: 0 % (ref 0.0–0.2)

## 2018-12-03 LAB — GLUCOSE, CAPILLARY
Glucose-Capillary: 98 mg/dL (ref 70–99)
Glucose-Capillary: 103 mg/dL — ABNORMAL HIGH (ref 70–99)
Glucose-Capillary: 89 mg/dL (ref 70–99)
Glucose-Capillary: 118 mg/dL — ABNORMAL HIGH (ref 70–99)
Glucose-Capillary: 103 mg/dL — ABNORMAL HIGH (ref 70–99)
Glucose-Capillary: 123 mg/dL — ABNORMAL HIGH (ref 70–99)

## 2018-12-03 LAB — PHOSPHORUS: Phosphorus: 3.3 mg/dL (ref 2.5–4.6)

## 2018-12-03 LAB — MAGNESIUM: Magnesium: 2.1 mg/dL (ref 1.7–2.4)

## 2018-12-03 NOTE — Progress Notes (Signed)
Pt refused CPT with am breathing treatment. Pt also refused to be suctioned at this time. RRT will continue to monitor.

## 2018-12-03 NOTE — Progress Notes (Signed)
Pt refused CPT this evening as well as suctioning. Pt has been on ATC for over 36 hours on 8lpm 35%. RT will continue to monitor.

## 2018-12-03 NOTE — Progress Notes (Signed)
PROGRESS NOTE    Robert Pham  PFX:[id number] DOB: February 15, 1951 DOA: 11/28/2018 PCP: Townsend Roger, MD    Brief Narrative:   Robert Pham is a 68 y.o. 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  Denies any chest pain fever chills nausea vomiting abdominal pain or diarrhea.  In addition patient has noticed increasing swelling of the lower extremities bilaterally over the last few days.  In the ER patient was afebrile chest x-ray was not showing anything acute.  Had some bloody discharge from the trach area.  On-call pulmonary critical care was consulted and patient placed on nebulizer treatment IV steroids for COPD exacerbation.  Patient's right upper extremity PICC line area looks slightly red and erythematous for which PICC line was removed and placed on empiric antibiotics after blood cultures obtained.  Per report patient had recent Candida UTI and also had Pseudomonas pneumonia.  Patient also was given Lasix 20 mg IV in the ER for lower extremity edema.  Patient's labs show creatinine of 0.5 sodium 140 potassium 4.7 BNP 75.2 hemoglobin 10.3 platelets 460.  EKG sinus tachycardia.  Assessment & Plan:   Principal Problem:   Acute respiratory failure with hypoxia (HCC) Active Problems:   COPD exacerbation (HCC)   Essential hypertension   Pressure injury of skin  Acute on chronic respiratory failure with hypoxia COPD exacerbation Trach and vent dependence Patient presenting from [facility] with progressive shortness of breath.  Patient was admitted to the intensive care unit and placed on full vent support.  Respiratory viral panel and COVID-19 test were both negative.  Patient was started on scheduled nebulized treatments and completed a 5-day course of IV  steroids and antibiotics with cefepime.  Blood cultures 1 out of 4 were positive for coagulase-negative Staphylococcus which was likely contaminant.  Patient did well and was transitioned off of vent support back to trach collar. --Continue trach collar, at 28% FiO2; although patient states cannot tolerate an uncuffed trach --vent prn --Unfortunately patient does not want to return back to [facility]; and SNF require uncuffed trach with maximum FiO2 of 28% and trach would also have to be 2 days old prior to dc per CM. --Patient states working with family to see if there are other options in the [place] area --Case management for coordination  Essential hypertension --Continue lisinopril 10 mg p.o. daily  Anemia Hemoglobin 9.1 today with MCV of 89.6.  BPH: Finasteride 5 mg p.o. daily  HLD: Continue statin  Anxiety:  Continue alprazolam 0.5 mg p.o. 3 times daily as needed and clonidine 0.1 mg nightly as needed  GERD: Continue PPI   DVT prophylaxis: Lovenox Code Status: Full code Family Communication: none Disposition Plan: Continue inpatient, awaiting SNF vs LTAC placement   Consultants:   PCCM  Procedures:  Transthoracic echocardiogram 11/29/2018: IMPRESSIONS  1. The left ventricle has normal systolic function with an ejection fraction of 60-65%. The cavity size was normal. Left ventricular diastolic Doppler parameters are consistent with impaired relaxation. No evidence of left ventricular regional wall  motion abnormalities.  2. The right ventricle has normal systolic function. The cavity was mildly enlarged. There is no increase in right ventricular wall thickness. Right ventricular systolic pressure could not be assessed.  3. Trivial pericardial effusion is present.  4. The aortic root is normal in size and structure.  Vascular ultrasound lower extremities: Summary: Right: There is no evidence of deep vein thrombosis in the lower extremity. No cystic structure  found in the popliteal fossa. Left: There is no evidence of deep vein thrombosis in the lower extremity. No cystic structure found in the popliteal fossa.  Antimicrobials:   Cefepime 5/26 - 5/29  Vancomycin 5/26 - 5/28   Subjective: Patient seen and examined at bedside, sitting in bedside chair.  Continues on trach collar with FiO2 28%.  Patient reports has been off ventilator for 1.5 days.  States he cannot tolerate uncuffed trach site, which may be issue for SNF placement.  Declines return back to [facility]; and family looking for other options in the [place] area.  No other complaints at this time.  Denies headache, no fever/chills/night sweats, no nausea/vomiting/diarrhea, no chest pain, no palpitations, no shortness of breath, no issues with bowel/bladder function, no cough/congestion.  No acute events overnight per nursing staff.  Objective: Vitals:   12/03/18 0810 12/03/18 0900 12/03/18 1000 12/03/18 1100  BP:  109/90 (!) 105/44 (!) 134/105  Pulse: 98  63 81  Resp: 18 (!) '22 18 20  '$ Temp:      TempSrc:      SpO2: 97%  99% 97%  Weight:      Height:        Intake/Output Summary (Last 24 hours) at 12/03/2018 1126 Last data filed at 12/03/2018 1000 Gross per 24 hour  Intake 1540 ml  Output 1350 ml  Net 190 ml   Filed Weights   11/30/18 0500 12/01/18 0500 12/02/18 0500  Weight: 56.9 kg 56.9 kg 56.9 kg    Examination:  General exam: Chronically ill in appearance, no acute distress, on trach collar Respiratory system: Clear to auscultation. Respiratory effort normal.  On 28% FiO2 per trach collar Cardiovascular system: S1 & S2 heard, RRR. No JVD, murmurs, rubs, gallops or clicks. No pedal edema. Gastrointestinal system: Abdomen is nondistended, soft and nontender. No organomegaly or masses felt. Normal bowel sounds heard. Central nervous system: Alert and oriented. No focal neurological deficits. Extremities: Symmetric 5 x 5 power. Skin: No rashes, lesions or  ulcers Psychiatry: Judgement and insight appear normal.  Anxious    Data Reviewed: I have personally reviewed following labs and imaging studies  CBC: Recent Labs  Lab 11/28/18 2346  11/29/18 0458 11/30/18 0350 12/01/18 0317 12/02/18 0152 12/03/18 0752  WBC 16.6*  --  14.8*  --  8.2 9.7 12.4*  NEUTROABS 13.3*  --   --   --   --   --   --   HGB 10.3*   < > 9.0* 9.9* 7.7* 8.4* 9.1*  HCT 35.3*   < > 30.7* 29.0* 25.7* 28.2* 31.1*  MCV 92.2  --  91.6  --  88.6 90.1 89.6  PLT 460*  --  386  --  312 330 382   < > = values in this interval not displayed.   Basic Metabolic Panel: Recent Labs  Lab 11/28/18 2346  11/29/18 0458 11/29/18 1446 11/30/18 0350 11/30/18 0551 12/01/18 0317 12/02/18 0152 12/03/18 0304  NA 140   < > 139  --  137  --  140 141 141  K 4.7   < > 4.1  --  4.3  --  4.4 4.2 3.9  CL 101  --  100  --   --   --  105 103 101  CO2 30  --  27  --   --   --  '27 28 30  '$ GLUCOSE 136*  --  179*  --   --   --  135* 125* 93  BUN 21  --  25*  --   --   --  26* 23 21  CREATININE 0.51*  --  0.76  --   --   --  0.53* 0.45* 0.50*  CALCIUM 9.7  --  9.7  --   --   --  9.2 9.3 9.3  MG  --   --   --  2.3  --  2.3 2.1 2.1 2.1  PHOS  --   --   --  3.9  --  4.0 3.3 3.1 3.3   < > = values in this interval not displayed.   GFR: Estimated Creatinine Clearance: 71.1 mL/min (A) (by C-G formula based on SCr of 0.5 mg/dL (L)). Liver Function Tests: Recent Labs  Lab 11/28/18 2346 12/01/18 0317  AST 18 17  ALT 19 20  ALKPHOS 84 51  BILITOT 0.3 0.4  PROT 7.2 5.9*  ALBUMIN 3.5 2.9*   No results for input(s): LIPASE, AMYLASE in the last 168 hours. No results for input(s): AMMONIA in the last 168 hours. Coagulation Profile: No results for input(s): INR, PROTIME in the last 168 hours. Cardiac Enzymes: No results for input(s): CKTOTAL, CKMB, CKMBINDEX, TROPONINI in the last 168 hours. BNP (last 3 results) No results for input(s): PROBNP in the last 8760 hours. HbA1C: No results  for input(s): HGBA1C in the last 72 hours. CBG: Recent Labs  Lab 12/02/18 1118 12/02/18 1657 12/02/18 2043 12/03/18 0436 12/03/18 0800  GLUCAP 91 102* 99 98 103*   Lipid Profile: No results for input(s): CHOL, HDL, LDLCALC, TRIG, CHOLHDL, LDLDIRECT in the last 72 hours. Thyroid Function Tests: No results for input(s): TSH, T4TOTAL, FREET4, T3FREE, THYROIDAB in the last 72 hours. Anemia Panel: No results for input(s): VITAMINB12, FOLATE, FERRITIN, TIBC, IRON, RETICCTPCT in the last 72 hours. Sepsis Labs: Recent Labs  Lab 11/28/18 2346 11/29/18 1446  LATICACIDVEN 1.3 2.4*    Recent Results (from the past 240 hour(s))  Blood Culture (routine x 2)     Status: None (Preliminary result)   Collection Time: 11/28/18 11:50 PM  Result Value Ref Range Status   Specimen Description SITE NOT SPECIFIED  Final   Special Requests   Final    BOTTLES DRAWN AEROBIC AND ANAEROBIC Blood Culture adequate volume   Culture   Final    NO GROWTH 3 DAYS Performed at [facility], [address], [place],  [address]    Report Status PENDING  Incomplete  SARS Coronavirus 2 (CEPHEID - Performed in [place] hospital lab), Hosp Order     Status: None   Collection Time: 11/28/18 11:57 PM  Result Value Ref Range Status   SARS Coronavirus 2 NEGATIVE NEGATIVE Final    Comment: (NOTE) If result is NEGATIVE SARS-CoV-2 target nucleic acids are NOT DETECTED. The SARS-CoV-2 RNA is generally detectable in upper and lower  respiratory specimens during the acute phase of infection. The lowest  concentration of SARS-CoV-2 viral copies this assay can detect is 250  copies / mL. A negative result does not preclude SARS-CoV-2 infection  and should not be used as the sole basis for treatment or other  patient management decisions.  A negative result may occur with  improper specimen collection / handling, submission of specimen other  than nasopharyngeal swab, presence of  viral mutation(s) within  the  areas targeted by this assay, and inadequate number of viral copies  (<250 copies / mL). A negative result must be combined with clinical  observations, patient history, and epidemiological information. If result is POSITIVE SARS-CoV-2 target nucleic acids are DETECTED. The SARS-CoV-2 RNA is generally detectable in upper and lower  respiratory specimens dur ing the acute phase of infection.  Positive  results are indicative of active infection with SARS-CoV-2.  Clinical  correlation with patient history and other diagnostic information is  necessary to determine patient infection status.  Positive results do  not rule out bacterial infection or co-infection with other viruses. If result is PRESUMPTIVE POSTIVE SARS-CoV-2 nucleic acids MAY BE PRESENT.   A presumptive positive result was obtained on the submitted specimen  and confirmed on repeat testing.  While 2019 novel coronavirus  (SARS-CoV-2) nucleic acids may be present in the submitted sample  additional confirmatory testing may be necessary for epidemiological  and / or clinical management purposes  to differentiate between  SARS-CoV-2 and other Sarbecovirus currently known to infect humans.  If clinically indicated additional testing with an alternate test  methodology [id number]) is advised. The SARS-CoV-2 RNA is generally  detectable in upper and lower respiratory sp ecimens during the acute  phase of infection. The expected result is Negative. Fact Sheet for Patients:  StrictlyIdeas.no Fact Sheet for Healthcare Providers: BankingDealers.co.za This test is not yet approved or cleared by the [place] FDA and has been authorized for detection and/or diagnosis of SARS-CoV-2 by FDA under an Emergency Use Authorization (EUA).  This EUA will remain in effect (meaning this test can be used) for the duration of the COVID-19 declaration under Section 564(b)(1) of the Act, 21  U.S.C. section 360bbb-3(b)(1), unless the authorization is terminated or revoked sooner. Performed at [facility], [place] [address], [place], [place] [address]   Blood Culture (routine x 2)     Status: Abnormal   Collection Time: 11/29/18  2:00 AM  Result Value Ref Range Status   Specimen Description BLOOD RIGHT HAND  Final   Special Requests   Final    BOTTLES DRAWN AEROBIC AND ANAEROBIC Blood Culture results may not be optimal due to an inadequate volume of blood received in culture bottles   Culture  Setup Time   Final    GRAM POSITIVE COCCI IN CLUSTERS ANAEROBIC BOTTLE ONLY CRITICAL RESULT CALLED TO, READ BACK BY AND VERIFIED WITH: Cristopher Estimable PharmD 15:30 11/30/18 ([email or web address])    Culture (A)  Final    STAPHYLOCOCCUS SPECIES (COAGULASE NEGATIVE) THE SIGNIFICANCE OF ISOLATING THIS ORGANISM FROM A SINGLE SET OF BLOOD CULTURES WHEN MULTIPLE SETS ARE DRAWN IS UNCERTAIN. PLEASE NOTIFY THE MICROBIOLOGY DEPARTMENT WITHIN ONE WEEK IF SPECIATION AND SENSITIVITIES ARE REQUIRED. Performed at [facility], [place] [address] [place],  [address]    Report Status 12/02/2018 FINAL  Final  Blood Culture ID Panel (Reflexed)     Status: Abnormal   Collection Time: 11/29/18  2:00 AM  Result Value Ref Range Status   Enterococcus species NOT DETECTED NOT DETECTED Final   Listeria monocytogenes NOT DETECTED NOT DETECTED Final   Staphylococcus species DETECTED (A) NOT DETECTED Final    Comment: Methicillin (oxacillin) resistant coagulase negative staphylococcus. Possible blood culture contaminant (unless isolated from more than one blood culture draw or clinical case suggests pathogenicity). No antibiotic treatment is indicated for blood  culture contaminants. CRITICAL RESULT CALLED TO, READ BACK BY AND VERIFIED WITH: E.  Maricela Bo PharmD 15:30 11/30/18 ([email or web address])    Staphylococcus aureus (BCID) NOT DETECTED NOT DETECTED Final   Methicillin resistance DETECTED (A) NOT DETECTED Final     Comment: CRITICAL RESULT CALLED TO, READ BACK BY AND VERIFIED WITH: Cristopher Estimable PharmD 15:30 11/30/18 ([email or web address])    Streptococcus species NOT DETECTED NOT DETECTED Final   Streptococcus agalactiae NOT DETECTED NOT DETECTED Final   Streptococcus pneumoniae NOT DETECTED NOT DETECTED Final   Streptococcus pyogenes NOT DETECTED NOT DETECTED Final   Acinetobacter baumannii NOT DETECTED NOT DETECTED Final   Enterobacteriaceae species NOT DETECTED NOT DETECTED Final   Enterobacter cloacae complex NOT DETECTED NOT DETECTED Final   Escherichia coli NOT DETECTED NOT DETECTED Final   Klebsiella oxytoca NOT DETECTED NOT DETECTED Final   Klebsiella pneumoniae NOT DETECTED NOT DETECTED Final   Proteus species NOT DETECTED NOT DETECTED Final   Serratia marcescens NOT DETECTED NOT DETECTED Final   Haemophilus influenzae NOT DETECTED NOT DETECTED Final   Neisseria meningitidis NOT DETECTED NOT DETECTED Final   Pseudomonas aeruginosa NOT DETECTED NOT DETECTED Final   Candida albicans NOT DETECTED NOT DETECTED Final   Candida glabrata NOT DETECTED NOT DETECTED Final   Candida krusei NOT DETECTED NOT DETECTED Final   Candida parapsilosis NOT DETECTED NOT DETECTED Final   Candida tropicalis NOT DETECTED NOT DETECTED Final    Comment: Performed at [facility], [place] [address], [place], [place] [address]  MRSA PCR Screening     Status: None   Collection Time: 11/29/18 10:58 AM  Result Value Ref Range Status   MRSA by PCR NEGATIVE NEGATIVE Final    Comment:        The GeneXpert MRSA Assay (FDA approved for NASAL specimens only), is one component of a comprehensive MRSA colonization surveillance program. It is not intended to diagnose MRSA infection nor to guide or monitor treatment for MRSA infections. Performed at [facility], [place] [address], [place], [place] [address]   Respiratory Panel by PCR     Status: None   Collection Time: 11/29/18  4:02 PM  Result Value Ref Range Status    Adenovirus NOT DETECTED NOT DETECTED Final   Coronavirus 229E NOT DETECTED NOT DETECTED Final    Comment: (NOTE) The Coronavirus on the Respiratory Panel, DOES NOT test for the novel  Coronavirus (2019 nCoV)    Coronavirus HKU1 NOT DETECTED NOT DETECTED Final   Coronavirus NL63 NOT DETECTED NOT DETECTED Final   Coronavirus OC43 NOT DETECTED NOT DETECTED Final   Metapneumovirus NOT DETECTED NOT DETECTED Final   Rhinovirus / Enterovirus NOT DETECTED NOT DETECTED Final   Influenza A NOT DETECTED NOT DETECTED Final   Influenza B NOT DETECTED NOT DETECTED Final   Parainfluenza Virus 1 NOT DETECTED NOT DETECTED Final   Parainfluenza Virus 2 NOT DETECTED NOT DETECTED Final   Parainfluenza Virus 3 NOT DETECTED NOT DETECTED Final   Parainfluenza Virus 4 NOT DETECTED NOT DETECTED Final   Respiratory Syncytial Virus NOT DETECTED NOT DETECTED Final   Bordetella pertussis NOT DETECTED NOT DETECTED Final   Chlamydophila pneumoniae NOT DETECTED NOT DETECTED Final   Mycoplasma pneumoniae NOT DETECTED NOT DETECTED Final    Comment: Performed at [facility], [place] [address], [place], [place] [address]         Radiology Studies: Dg Chest Port 1 View  Result Date: 12/03/2018 CLINICAL DATA:  Acute on chronic respiratory failure EXAM: PORTABLE CHEST 1 VIEW COMPARISON:  12/02/2018 FINDINGS: Tracheostomy in satisfactory position.  Emphysema with bullous changes at the lung apices. Nipple shadow overlying the left lower lung. Bibasilar scarring/atelectasis. No focal consolidation. No pleural effusion or pneumothorax. The heart is normal in size. IMPRESSION: No evidence of acute cardiopulmonary disease. Electronically Signed   By: Julian Hy M.D.   On: 12/03/2018 08:00   Dg Chest Port 1 View  Result Date: 12/02/2018 CLINICAL DATA:  Shortness of breath. EXAM: PORTABLE CHEST 1 VIEW COMPARISON:  Radiograph of Nov 30, 2018. FINDINGS: The heart size and mediastinal contours are within normal limits.  Tracheostomy tube is unchanged in position. Extensive emphysematous disease is noted in both lungs. Stable bibasilar opacities are noted concerning for scarring, atelectasis or possibly infiltrates. Stable rounded density is noted in left lung base which may represent focal inflammation. The visualized skeletal structures are unremarkable. IMPRESSION: Stable bibasilar opacities are noted concerning for scarring, atelectasis or possibly infiltrates. Stable rounded density is noted in left lung base which may represent focal infiltrate, but if it is unchanged in follow-up radiographs, CT scan of the chest is recommended to rule out possible nodule or mass. Emphysema (ICD10-J43.9). Electronically Signed   By: Marijo Conception M.D.   On: 12/02/2018 08:57        Scheduled Meds:  atorvastatin  20 mg Oral q1800   chlorhexidine gluconate (MEDLINE KIT)  15 mL Mouth Rinse BID   Chlorhexidine Gluconate Cloth  6 each Topical Daily   enoxaparin (LOVENOX) injection  40 mg Subcutaneous Q24H   finasteride  5 mg Oral Daily   insulin aspart  2-6 Units Subcutaneous Q4H   ipratropium-albuterol  3 mL Nebulization Q6H   lisinopril  10 mg Per Tube Daily   mouth rinse  15 mL Mouth Rinse 10 times per day   pantoprazole (PROTONIX) IV  40 mg Intravenous Daily   Continuous Infusions:  feeding supplement (JEVITY 1.2 CAL) 1,000 mL (12/03/18 0135)     LOS: 4 days    Time spent: 29 minutes    Talin Rozeboom J [place] DO [facility] Pager [phone]  If 7PM-7AM, please contact night-coverage [email or web address] Password [email or web address] 12/03/2018, 11:26 AM

## 2018-12-04 LAB — MAGNESIUM: Magnesium: 2.1 mg/dL (ref 1.7–2.4)

## 2018-12-04 LAB — BASIC METABOLIC PANEL
Anion gap: 10 (ref 5–15)
BUN: 23 mg/dL (ref 8–23)
CO2: 32 mmol/L (ref 22–32)
Calcium: 9.3 mg/dL (ref 8.9–10.3)
Chloride: 99 mmol/L (ref 98–111)
Creatinine, Ser: 0.47 mg/dL — ABNORMAL LOW (ref 0.61–1.24)
GFR calc Af Amer: >60 mL/min (ref >60)
GFR calc non Af Amer: >60 mL/min (ref >60)
Glucose, Bld: 130 mg/dL — ABNORMAL HIGH (ref 70–99)
Potassium: 3.8 mmol/L (ref 3.5–5.1)
Sodium: 141 mmol/L (ref 135–145)

## 2018-12-04 LAB — CBC
HCT: 28.9 % — ABNORMAL LOW (ref 39.0–52.0)
Hemoglobin: 8.7 g/dL — ABNORMAL LOW (ref 13.0–17.0)
MCH: 26.3 pg (ref 26.0–34.0)
MCHC: 30.1 g/dL (ref 30.0–36.0)
MCV: 87.3 fL (ref 80.0–100.0)
Platelets: 323 10*3/uL (ref 150–400)
RBC: 3.31 MIL/uL — ABNORMAL LOW (ref 4.22–5.81)
RDW: 14.4 % (ref 11.5–15.5)
WBC: 9.1 10*3/uL (ref 4.0–10.5)
nRBC: 0 % (ref 0.0–0.2)

## 2018-12-04 LAB — CULTURE, BLOOD (ROUTINE X 2)
Culture: NO GROWTH
Special Requests: ADEQUATE

## 2018-12-04 LAB — PHOSPHORUS: Phosphorus: 3.6 mg/dL (ref 2.5–4.6)

## 2018-12-04 LAB — GLUCOSE, CAPILLARY
Glucose-Capillary: 59 mg/dL — ABNORMAL LOW (ref 70–99)
Glucose-Capillary: 121 mg/dL — ABNORMAL HIGH (ref 70–99)
Glucose-Capillary: 74 mg/dL (ref 70–99)
Glucose-Capillary: 103 mg/dL — ABNORMAL HIGH (ref 70–99)

## 2018-12-04 MED ORDER — DEXTROSE 50 % IV SOLN
INTRAVENOUS | Status: AC
  Administered 2018-12-04: 50 mL
  Filled 2018-12-04: qty 50

## 2018-12-04 MED ORDER — PANTOPRAZOLE SODIUM 40 MG PO PACK
40.0000 mg | PACK | Freq: Every day | ORAL | Status: DC
  Administered 2018-12-05 – 2018-12-14 (×10): 40 mg
  Filled 2018-12-04 (×12): qty 20

## 2018-12-04 MED ORDER — DEXTROSE 50 % IV SOLN
12.5000 g | INTRAVENOUS | Status: AC
  Administered 2018-12-04: 09:00:00 12.5 g via INTRAVENOUS

## 2018-12-04 NOTE — Progress Notes (Signed)
Pt refused CPT at this time. Morning aerosol treatment given. Pt refused suctioning at this time as well. RRT will continue to monitor.

## 2018-12-04 NOTE — Progress Notes (Signed)
Hypoglycemic Event  CBG: 59  Treatment: D50 12.5g  Symptoms: none  Follow-up CBG: Time: 0900  Possible Reasons for Event: Insulin given at 0200 for blood sugar 130 per SSI order.  Comments/MD notified: Dr. [place] notified and orders received to d/c SSI.    Garner Gavel [facility]

## 2018-12-04 NOTE — Progress Notes (Signed)
PROGRESS NOTE    Robert Pham  RDE:[id number] DOB: 07/01/1951 DOA: 11/28/2018 PCP: Townsend Roger, MD    Brief Narrative:   Robert Pham is a 68 y.o. 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  Denies any chest pain fever chills nausea vomiting abdominal pain or diarrhea.  In addition patient has noticed increasing swelling of the lower extremities bilaterally over the last few days.  In the ER patient was afebrile chest x-ray was not showing anything acute.  Had some bloody discharge from the trach area.  On-call pulmonary critical care was consulted and patient placed on nebulizer treatment IV steroids for COPD exacerbation.  Patient's right upper extremity PICC line area looks slightly red and erythematous for which PICC line was removed and placed on empiric antibiotics after blood cultures obtained.  Per report patient had recent Candida UTI and also had Pseudomonas pneumonia.  Patient also was given Lasix 20 mg IV in the ER for lower extremity edema.  Patient's labs show creatinine of 0.5 sodium 140 potassium 4.7 BNP 75.2 hemoglobin 10.3 platelets 460.  EKG sinus tachycardia.  Assessment & Plan:   Principal Problem:   Acute respiratory failure with hypoxia (HCC) Active Problems:   COPD exacerbation (HCC)   Essential hypertension   Pressure injury of skin  Acute on chronic respiratory failure with hypoxia COPD exacerbation Trach and vent dependence Patient presenting from [facility] with progressive shortness of breath.  Patient was admitted to the intensive care unit and placed on full vent support.  Respiratory viral panel and COVID-19 test were both negative.  Patient was started on scheduled nebulized treatments and completed a 5-day course of IV  steroids and antibiotics with cefepime.  Blood cultures 1 out of 4 were positive for coagulase-negative Staphylococcus which was likely contaminant.  Patient did well and was transitioned off of vent support back to trach collar. --Continue trach collar, at 28% FiO2; although patient states cannot tolerate an uncuffed trach --vent prn --Unfortunately patient does not want to return back to [facility]; and SNF require uncuffed trach with maximum FiO2 of 28% and trach would also have to be 26 days old prior to dc per CM. --Patient states working with family to see if there are other options in the [place] area --Continue respiratory therapy with nebs and suctioning as needed --Case management for coordination  Essential hypertension --Continue lisinopril 10 mg p.o. daily  Anemia Hemoglobin 9.1 today with MCV of 89.6.  BPH: Finasteride 5 mg p.o. daily  HLD: Continue statin  Anxiety:  Continue alprazolam 0.5 mg p.o. 3 times daily as needed and clonidine 0.1 mg nightly as needed  GERD: Continue PPI   DVT prophylaxis: Lovenox Code Status: Full code Family Communication: none Disposition Plan: Continue inpatient, awaiting SNF vs LTAC placement; will likely need LTAC given he states cannot tolerate an uncuffed trach which is not acceptable for SNF level of care per case management.   Consultants:   PCCM -signed off 5/29  Procedures:  Transthoracic echocardiogram 11/29/2018: IMPRESSIONS  1. The left ventricle has normal systolic function with an ejection fraction of 60-65%. The cavity size was normal. Left ventricular diastolic Doppler parameters are consistent with impaired relaxation. No evidence of left ventricular regional wall  motion abnormalities.  2.  The right ventricle has normal systolic function. The cavity was mildly enlarged. There is no increase in right ventricular wall thickness. Right ventricular systolic pressure could not be assessed.  3. Trivial pericardial  effusion is present.  4. The aortic root is normal in size and structure.  Vascular ultrasound lower extremities: Summary: Right: There is no evidence of deep vein thrombosis in the lower extremity. No cystic structure found in the popliteal fossa. Left: There is no evidence of deep vein thrombosis in the lower extremity. No cystic structure found in the popliteal fossa.  Antimicrobials:   Cefepime 5/26 - 5/29  Vancomycin 5/26 - 5/28   Subjective: Patient seen and examined at bedside, sitting in bedside chair.  Continues on trach collar with FiO2 28%.  Requesting suctioning this morning.  Now off ventilatory support for the past 3 days.  No other complaints at this time.  Denies headache, no fever/chills/night sweats, no nausea/vomiting/diarrhea, no chest pain, no palpitations, no shortness of breath, no issues with bowel/bladder function, no cough/congestion.  No acute events overnight per nursing staff.  Objective: Vitals:   12/04/18 1000 12/04/18 1100 12/04/18 1103 12/04/18 1146  BP: 131/87     Pulse: 65 63    Resp: (!) 22 (!) 21 18   Temp:    97.6 F (36.4 C)  TempSrc:    Oral  SpO2: 100% 100%    Weight:      Height:        Intake/Output Summary (Last 24 hours) at 12/04/2018 1417 Last data filed at 12/04/2018 1100 Gross per 24 hour  Intake 1200 ml  Output 725 ml  Net 475 ml   Filed Weights   11/30/18 0500 12/01/18 0500 12/02/18 0500  Weight: 56.9 kg 56.9 kg 56.9 kg    Examination:  General exam: Chronically ill in appearance, no acute distress, on trach collar Respiratory system: Clear to auscultation. Respiratory effort normal.  On 28% FiO2 per trach collar Cardiovascular system: S1 & S2 heard, RRR. No JVD, murmurs, rubs, gallops or clicks. No pedal edema. Gastrointestinal system: Abdomen is nondistended, soft and nontender. No organomegaly or masses felt. Normal bowel sounds heard. Central nervous system: Alert and oriented. No focal neurological deficits.  Extremities: Symmetric 5 x 5 power. Skin: No rashes, lesions or ulcers Psychiatry: Judgement and insight appear normal.  Anxious    Data Reviewed: I have personally reviewed following labs and imaging studies  CBC: Recent Labs  Lab 11/28/18 2346  11/29/18 0458 11/30/18 0350 12/01/18 0317 12/02/18 0152 12/03/18 0752 12/04/18 0238  WBC 16.6*  --  14.8*  --  8.2 9.7 12.4* 9.1  NEUTROABS 13.3*  --   --   --   --   --   --   --   HGB 10.3*   < > 9.0* 9.9* 7.7* 8.4* 9.1* 8.7*  HCT 35.3*   < > 30.7* 29.0* 25.7* 28.2* 31.1* 28.9*  MCV 92.2  --  91.6  --  88.6 90.1 89.6 87.3  PLT 460*  --  386  --  312 330 382 323   < > = values in this interval not displayed.   Basic Metabolic Panel: Recent Labs  Lab 11/29/18 0458  11/30/18 0350 11/30/18 0551 12/01/18 0317 12/02/18 0152 12/03/18 0304 12/04/18 0238  NA 139  --  137  --  140 141 141 141  K 4.1  --  4.3  --  4.4 4.2 3.9 3.8  CL 100  --   --   --  105 103 101 99  CO2 27  --   --   --  _0 32  GLUCOSE 179*  --   --   --  135* 125* 93 130*  BUN 25*  --   --   --  26* _1 CREATININE 0.76  --   --   --  0.53* 0.45* 0.50* 0.47*  CALCIUM 9.7  --   --   --  9.2 9.3 9.3 9.3  MG  --    < >  --  2.3 2.1 2.1 2.1 2.1  PHOS  --    < >  --  4.0 3.3 3.1 3.3 3.6   < > = values in this interval not displayed.   GFR: Estimated Creatinine Clearance: 71.1 mL/min (A) (by C-G formula based on SCr of 0.47 mg/dL (L)). Liver Function Tests: Recent Labs  Lab 11/28/18 2346 12/01/18 0317  AST 18 17  ALT 19 20  ALKPHOS 84 51  BILITOT 0.3 0.4  PROT 7.2 5.9*  ALBUMIN 3.5 2.9*   No results for input(s): LIPASE, AMYLASE in the last 168 hours. No results for input(s): AMMONIA in the last 168 hours. Coagulation Profile: No results for input(s): INR, PROTIME in the last 168 hours. Cardiac Enzymes: No results for input(s): CKTOTAL, CKMB, CKMBINDEX, TROPONINI in the last 168 hours. BNP (last 3 results) No results for input(s): PROBNP  in the last 8760 hours. HbA1C: No results for input(s): HGBA1C in the last 72 hours. CBG: Recent Labs  Lab 12/03/18 1552 12/03/18 2007 12/03/18 2329 12/04/18 0809 12/04/18 0905  GLUCAP 118* 103* 123* 59* 121*   Lipid Profile: No results for input(s): CHOL, HDL, LDLCALC, TRIG, CHOLHDL, LDLDIRECT in the last 72 hours. Thyroid Function Tests: No results for input(s): TSH, T4TOTAL, FREET4, T3FREE, THYROIDAB in the last 72 hours. Anemia Panel: No results for input(s): VITAMINB12, FOLATE, FERRITIN, TIBC, IRON, RETICCTPCT in the last 72 hours. Sepsis Labs: Recent Labs  Lab 11/28/18 2346 11/29/18 1446  LATICACIDVEN 1.3 2.4*    Recent Results (from the past 240 hour(s))  Blood Culture (routine x 2)     Status: None (Preliminary result)   Collection Time: 11/28/18 11:50 PM  Result Value Ref Range Status   Specimen Description SITE NOT SPECIFIED  Final   Special Requests   Final    BOTTLES DRAWN AEROBIC AND ANAEROBIC Blood Culture adequate volume   Culture   Final    NO GROWTH 4 DAYS Performed at [facility], [address], [place], [place] [address]    Report Status PENDING  Incomplete  SARS Coronavirus 2 (CEPHEID - Performed in [place] hospital lab), Hosp Order     Status: None   Collection Time: 11/28/18 11:57 PM  Result Value Ref Range Status   SARS Coronavirus 2 NEGATIVE NEGATIVE Final    Comment: (NOTE) If result is NEGATIVE SARS-CoV-2 target nucleic acids are NOT DETECTED. The SARS-CoV-2 RNA is generally detectable in upper and lower  respiratory specimens during the acute phase of infection. The lowest  concentration of SARS-CoV-2 viral copies this assay can detect is 250  copies / mL. A negative result does not preclude SARS-CoV-2 infection  and should not be used as the sole basis for treatment or other  patient management decisions.  A negative result may occur with  improper specimen collection / handling, submission of specimen other  than  nasopharyngeal swab, presence of viral mutation(s) within the  areas targeted by  this assay, and inadequate number of viral copies  (<250 copies / mL). A negative result must be combined with clinical  observations, patient history, and epidemiological information. If result is POSITIVE SARS-CoV-2 target nucleic acids are DETECTED. The SARS-CoV-2 RNA is generally detectable in upper and lower  respiratory specimens dur ing the acute phase of infection.  Positive  results are indicative of active infection with SARS-CoV-2.  Clinical  correlation with patient history and other diagnostic information is  necessary to determine patient infection status.  Positive results do  not rule out bacterial infection or co-infection with other viruses. If result is PRESUMPTIVE POSTIVE SARS-CoV-2 nucleic acids MAY BE PRESENT.   A presumptive positive result was obtained on the submitted specimen  and confirmed on repeat testing.  While 2019 novel coronavirus  (SARS-CoV-2) nucleic acids may be present in the submitted sample  additional confirmatory testing may be necessary for epidemiological  and / or clinical management purposes  to differentiate between  SARS-CoV-2 and other Sarbecovirus currently known to infect humans.  If clinically indicated additional testing with an alternate test  methodology [phone]) is advised. The SARS-CoV-2 RNA is generally  detectable in upper and lower respiratory sp ecimens during the acute  phase of infection. The expected result is Negative. Fact Sheet for Patients:  StrictlyIdeas.no Fact Sheet for Healthcare Providers: BankingDealers.co.za This test is not yet approved or cleared by the [place] FDA and has been authorized for detection and/or diagnosis of SARS-CoV-2 by FDA under an Emergency Use Authorization (EUA).  This EUA will remain in effect (meaning this test can be used) for the duration of the  COVID-19 declaration under Section 564(b)(1) of the Act, 21 U.S.C. section 360bbb-3(b)(1), unless the authorization is terminated or revoked sooner. Performed at [facility], [place] [address], [place], [place] [address]   Blood Culture (routine x 2)     Status: Abnormal   Collection Time: 11/29/18  2:00 AM  Result Value Ref Range Status   Specimen Description BLOOD RIGHT HAND  Final   Special Requests   Final    BOTTLES DRAWN AEROBIC AND ANAEROBIC Blood Culture results may not be optimal due to an inadequate volume of blood received in culture bottles   Culture  Setup Time   Final    GRAM POSITIVE COCCI IN CLUSTERS ANAEROBIC BOTTLE ONLY CRITICAL RESULT CALLED TO, READ BACK BY AND VERIFIED WITH: Cristopher Estimable PharmD 15:30 11/30/18 ([email or web address])    Culture (A)  Final    STAPHYLOCOCCUS SPECIES (COAGULASE NEGATIVE) THE SIGNIFICANCE OF ISOLATING THIS ORGANISM FROM A SINGLE SET OF BLOOD CULTURES WHEN MULTIPLE SETS ARE DRAWN IS UNCERTAIN. PLEASE NOTIFY THE MICROBIOLOGY DEPARTMENT WITHIN ONE WEEK IF SPECIATION AND SENSITIVITIES ARE REQUIRED. Performed at [facility], [place] [address], [place], [place] [address]    Report Status 12/02/2018 FINAL  Final  Blood Culture ID Panel (Reflexed)     Status: Abnormal   Collection Time: 11/29/18  2:00 AM  Result Value Ref Range Status   Enterococcus species NOT DETECTED NOT DETECTED Final   Listeria monocytogenes NOT DETECTED NOT DETECTED Final   Staphylococcus species DETECTED (A) NOT DETECTED Final    Comment: Methicillin (oxacillin) resistant coagulase negative staphylococcus. Possible blood culture contaminant (unless isolated from more than one blood culture draw or clinical case suggests pathogenicity). No antibiotic treatment is indicated for blood  culture contaminants. CRITICAL RESULT CALLED TO, READ BACK BY AND VERIFIED WITH: Cristopher Estimable PharmD 15:30 11/30/18 ([email or web address])    Staphylococcus  aureus (BCID) NOT DETECTED NOT DETECTED Final    Methicillin resistance DETECTED (A) NOT DETECTED Final    Comment: CRITICAL RESULT CALLED TO, READ BACK BY AND VERIFIED WITH: Cristopher Estimable PharmD 15:30 11/30/18 ([email or web address])    Streptococcus species NOT DETECTED NOT DETECTED Final   Streptococcus agalactiae NOT DETECTED NOT DETECTED Final   Streptococcus pneumoniae NOT DETECTED NOT DETECTED Final   Streptococcus pyogenes NOT DETECTED NOT DETECTED Final   Acinetobacter baumannii NOT DETECTED NOT DETECTED Final   Enterobacteriaceae species NOT DETECTED NOT DETECTED Final   Enterobacter cloacae complex NOT DETECTED NOT DETECTED Final   Escherichia coli NOT DETECTED NOT DETECTED Final   Klebsiella oxytoca NOT DETECTED NOT DETECTED Final   Klebsiella pneumoniae NOT DETECTED NOT DETECTED Final   Proteus species NOT DETECTED NOT DETECTED Final   Serratia marcescens NOT DETECTED NOT DETECTED Final   Haemophilus influenzae NOT DETECTED NOT DETECTED Final   Neisseria meningitidis NOT DETECTED NOT DETECTED Final   Pseudomonas aeruginosa NOT DETECTED NOT DETECTED Final   Candida albicans NOT DETECTED NOT DETECTED Final   Candida glabrata NOT DETECTED NOT DETECTED Final   Candida krusei NOT DETECTED NOT DETECTED Final   Candida parapsilosis NOT DETECTED NOT DETECTED Final   Candida tropicalis NOT DETECTED NOT DETECTED Final    Comment: Performed at [facility], [place]. [address] [place], [place] [address]  MRSA PCR Screening     Status: None   Collection Time: 11/29/18 10:58 AM  Result Value Ref Range Status   MRSA by PCR NEGATIVE NEGATIVE Final    Comment:        The GeneXpert MRSA Assay (FDA approved for NASAL specimens only), is one component of a comprehensive MRSA colonization surveillance program. It is not intended to diagnose MRSA infection nor to guide or monitor treatment for MRSA infections. Performed at [facility], [place] [address], [place], [place] [address]   Respiratory Panel by PCR     Status: None   Collection  Time: 11/29/18  4:02 PM  Result Value Ref Range Status   Adenovirus NOT DETECTED NOT DETECTED Final   Coronavirus 229E NOT DETECTED NOT DETECTED Final    Comment: (NOTE) The Coronavirus on the Respiratory Panel, DOES NOT test for the novel  Coronavirus (2019 nCoV)    Coronavirus HKU1 NOT DETECTED NOT DETECTED Final   Coronavirus NL63 NOT DETECTED NOT DETECTED Final   Coronavirus OC43 NOT DETECTED NOT DETECTED Final   Metapneumovirus NOT DETECTED NOT DETECTED Final   Rhinovirus / Enterovirus NOT DETECTED NOT DETECTED Final   Influenza A NOT DETECTED NOT DETECTED Final   Influenza B NOT DETECTED NOT DETECTED Final   Parainfluenza Virus 1 NOT DETECTED NOT DETECTED Final   Parainfluenza Virus 2 NOT DETECTED NOT DETECTED Final   Parainfluenza Virus 3 NOT DETECTED NOT DETECTED Final   Parainfluenza Virus 4 NOT DETECTED NOT DETECTED Final   Respiratory Syncytial Virus NOT DETECTED NOT DETECTED Final   Bordetella pertussis NOT DETECTED NOT DETECTED Final   Chlamydophila pneumoniae NOT DETECTED NOT DETECTED Final   Mycoplasma pneumoniae NOT DETECTED NOT DETECTED Final    Comment: Performed at [facility], [place]. [address] [place], [place] [address]         Radiology Studies: Dg Chest Port 1 View  Result Date: 12/03/2018 CLINICAL DATA:  Acute on chronic respiratory failure EXAM: PORTABLE CHEST 1 VIEW COMPARISON:  12/02/2018 FINDINGS: Tracheostomy in satisfactory position. Emphysema with bullous changes at the lung apices. Nipple  shadow overlying the left lower lung. Bibasilar scarring/atelectasis. No focal consolidation. No pleural effusion or pneumothorax. The heart is normal in size. IMPRESSION: No evidence of acute cardiopulmonary disease. Electronically Signed   By: Julian Hy M.D.   On: 12/03/2018 08:00        Scheduled Meds: . atorvastatin  20 mg Oral q1800  . chlorhexidine gluconate (MEDLINE KIT)  15 mL Mouth Rinse BID  . Chlorhexidine Gluconate Cloth  6 each  Topical Daily  . enoxaparin (LOVENOX) injection  40 mg Subcutaneous Q24H  . finasteride  5 mg Oral Daily  . ipratropium-albuterol  3 mL Nebulization Q6H  . lisinopril  10 mg Per Tube Daily  . mouth rinse  15 mL Mouth Rinse 10 times per day  . [START ON 12/05/2018] pantoprazole sodium  40 mg Per Tube Daily   Continuous Infusions: . feeding supplement (JEVITY 1.2 CAL) 1,000 mL (12/04/18 1116)     LOS: 5 days    Time spent: 29 minutes    Eric J [place] DO [facility] Pager [phone]  If 7PM-7AM, please contact night-coverage [email or web address] Password [email or web address] 12/04/2018, 2:17 PM

## 2018-12-05 LAB — GLUCOSE, CAPILLARY
Glucose-Capillary: 103 mg/dL — ABNORMAL HIGH (ref 70–99)
Glucose-Capillary: 51 mg/dL — ABNORMAL LOW (ref 70–99)
Glucose-Capillary: 67 mg/dL — ABNORMAL LOW (ref 70–99)
Glucose-Capillary: 69 mg/dL — ABNORMAL LOW (ref 70–99)
Glucose-Capillary: 73 mg/dL (ref 70–99)
Glucose-Capillary: 88 mg/dL (ref 70–99)
Glucose-Capillary: 93 mg/dL (ref 70–99)
Glucose-Capillary: 94 mg/dL (ref 70–99)

## 2018-12-05 LAB — CBC
HCT: 29.4 % — ABNORMAL LOW (ref 39.0–52.0)
Hemoglobin: 8.9 g/dL — ABNORMAL LOW (ref 13.0–17.0)
MCH: 26.4 pg (ref 26.0–34.0)
MCHC: 30.3 g/dL (ref 30.0–36.0)
MCV: 87.2 fL (ref 80.0–100.0)
Platelets: 360 10*3/uL (ref 150–400)
RBC: 3.37 MIL/uL — ABNORMAL LOW (ref 4.22–5.81)
RDW: 14.7 % (ref 11.5–15.5)
WBC: 10.7 10*3/uL — ABNORMAL HIGH (ref 4.0–10.5)
nRBC: 0 % (ref 0.0–0.2)

## 2018-12-05 LAB — BASIC METABOLIC PANEL
Anion gap: 8 (ref 5–15)
BUN: 21 mg/dL (ref 8–23)
CO2: 29 mmol/L (ref 22–32)
Calcium: 9 mg/dL (ref 8.9–10.3)
Chloride: 103 mmol/L (ref 98–111)
Creatinine, Ser: 0.51 mg/dL — ABNORMAL LOW (ref 0.61–1.24)
GFR calc Af Amer: >60 mL/min (ref >60)
GFR calc non Af Amer: >60 mL/min (ref >60)
Glucose, Bld: 142 mg/dL — ABNORMAL HIGH (ref 70–99)
Potassium: 4.1 mmol/L (ref 3.5–5.1)
Sodium: 140 mmol/L (ref 135–145)

## 2018-12-05 MED ORDER — PRO-STAT SUGAR FREE PO LIQD
30.0000 mL | Freq: Every day | ORAL | Status: DC
  Administered 2018-12-05 – 2018-12-14 (×10): 30 mL
  Filled 2018-12-05 (×10): qty 30

## 2018-12-05 NOTE — Progress Notes (Signed)
Physical Therapy Treatment Patient Details Name: Robert Pham MRN: [id number] DOB: 05-Nov-1950 Today's Date: 12/05/2018    History of Present Illness 68 y.o. 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 Patient admitted from [facility], on chronic trach and vent dependent. Pt is nonverbal and unable to give history. Per chart review, trach was most likely placed sometime in March 2020.    PT Comments    Pt admitted with above diagnosis. Pt currently with functional limitations due to the deficits listed below (see PT Problem List). Pt was able to ambulate with RW with min guard to min assist with 1 seated rest break due to fatigue.  Also needed 40% trach collar to maintain sats >90%.  Pt still anxious at times and needs cues for relaxation.  Will follow acutely.  Pt will benefit from skilled PT to increase their independence and safety with mobility to allow discharge to the venue listed below.     Follow Up Recommendations  LTACH;Supervision/Assistance - 24 hour(Back to [facility])     Equipment Recommendations  None recommended by PT    Recommendations for Other Services       Precautions / Restrictions Precautions Precautions: Fall Restrictions Weight Bearing Restrictions: No(Simultaneous filing. User may not have seen previous data.)    Mobility  Bed Mobility Overal bed mobility: Independent                Transfers Overall transfer level: Needs assistance Equipment used: None Transfers: Sit to/from [employer]GI CorporationStand;Stand Pivot Transfers Sit to Stand: Min guard Stand pivot transfers: Min guard       General transfer comment: Impulsive therefore needs cues for safety.   Ambulation/Gait Ambulation/Gait assistance: Min guard;Min assist;+2  safety/equipment Gait Distance (Feet): 370 Feet(270 feet then 100 feet) Assistive device: Rolling walker (2 wheeled) Gait Pattern/deviations: Step-through pattern;Decreased stride length;Trunk flexed   Gait velocity interpretation: 1.31 - 2.62 ft/sec, indicative of limited community ambulator General Gait Details: Pt was able to ambulate with RW in hallway.  Pt with overall good steady gait.  Did take a few standing rest breaks and 1 sitting rest break.  FiO2 was 40% trach collar with sats >90% for the walk.  Other VSS. Pt was on 28% at rest.     Stairs             Wheelchair Mobility    Modified Rankin (Stroke Patients Only)       Balance Overall balance assessment: Needs assistance Sitting-balance support: No upper extremity supported;Feet supported Sitting balance-Leahy Scale: Good     Standing balance support: No upper extremity supported;During functional activity Standing balance-Leahy Scale: Fair Standing balance comment: can stand staticaly without UE support                            Cognition Arousal/Alertness: Awake/alert Behavior During Therapy: WFL for tasks assessed/performed Overall Cognitive Status: Within Functional Limits for tasks assessed                                        Exercises General Exercises - Lower Extremity Long Arc Quad: AROM;Both;10 reps;Seated    General Comments  Pertinent Vitals/Pain Pain Assessment: No/denies pain    Home Living                      Prior Function            PT Goals (current goals can now be found in the care plan section) Acute Rehab PT Goals Patient Stated Goal: to get better Progress towards PT goals: Progressing toward goals    Frequency    Min 3X/week      PT Plan Current plan remains appropriate    Co-evaluation              AM-PAC PT "6 Clicks" Mobility   Outcome Measure  Help needed turning from your back to your side while  in a flat bed without using bedrails?: None Help needed moving from lying on your back to sitting on the side of a flat bed without using bedrails?: None Help needed moving to and from a bed to a chair (including a wheelchair)?: None Help needed standing up from a chair using your arms (e.g., wheelchair or bedside chair)?: A Little Help needed to walk in hospital room?: A Little Help needed climbing 3-5 steps with a railing? : A Little 6 Click Score: 21    End of Session Equipment Utilized During Treatment: Gait belt;Oxygen(trach collar 40%) Activity Tolerance: Patient tolerated treatment well;Patient limited by fatigue Patient left: in chair;with call bell/phone within reach;with chair alarm set Nurse Communication: Mobility status PT Visit Diagnosis: Unsteadiness on feet (R26.81);Muscle weakness (generalized) (M62.81)     Time: 5852-7782 PT Time Calculation (min) (ACUTE ONLY): 25 min  Charges:  $Gait Training: 23-37 mins                     Yuval Nolet,PT Acute Rehabilitation Services Pager:  [phone]  Office:  [phone]     Berline Lopes 12/05/2018, 1:05 PM

## 2018-12-05 NOTE — Progress Notes (Signed)
Nutrition Follow-up  RD working remotely.  DOCUMENTATION CODES:   Not applicable  INTERVENTION:   Continue tube feeding via PEG: - Jevity 1.2 @ 60 ml/hr (1440 ml/day) - Pro-stat 30 ml daily - Free water per MD, recommend 150 ml QID  Tube feeding regimen and recommended free water provides 1828 kcal, 94 grams of protein, and 1762 ml of H2O (100% of needs).  NUTRITION DIAGNOSIS:   Inadequate oral intake related to (chronic trach and vent dependent) as evidenced by NPO status.  Ongoing, being addressed via TF  GOAL:   Patient will meet greater than or equal to 90% of their needs  Met via TF  MONITOR:   Vent status, Labs, Weight trends, I & O's, TF tolerance  REASON FOR ASSESSMENT:   Consult Enteral/tube feeding initiation and management  ASSESSMENT:   68 y.o. 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    Pt has been without vent support over the past 4 days. Plan is to transfer to SDU.   Weight stable since admission per chart.  Noted order for Pro-stat 30 ml daily per tube was d/c. Will reorder as Jevity 1.2 @ 60 ml/hr alone does not meet pt's nutritional needs.  Current TF: Jevity 1.2 @ 60 ml/hr  Medications reviewed and include: Protonix  Labs reviewed: hemoglobin 8.9 (L) CBG's: 51-103 x 24 hours  UOP: 1025 ml x 24 hours I/O's: +1.0 L since admit  Diet Order:   Diet Order    None      EDUCATION NEEDS:   Not appropriate for education at this time  Skin:  Skin Assessment: Skin Integrity Issues: Stage I: sacrum  Last BM:  12/01/18  Height:   Ht Readings from Last 1 Encounters:  11/29/18 '5\' 5"'$  (1.651 m)    Weight:   Wt Readings from Last 1 Encounters:  12/02/18 56.9 kg    Ideal Body Weight:  61.8 kg  BMI:   Body mass index is 20.87 kg/m.  Estimated Nutritional Needs:   Kcal:  1700-1900  Protein:  85-95g  Fluid:  1.7L/day    Gaynell Face, MS, RD, LDN Inpatient Clinical Dietitian Pager: [phone] Weekend/After Hours: [phone]

## 2018-12-05 NOTE — Progress Notes (Signed)
PROGRESS NOTE    Robert Pham  LNL:[id number] DOB: 26-Mar-1951 DOA: 11/28/2018 PCP: Townsend Roger, MD    Brief Narrative:   Robert Pham is a 68 y.o. 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  Denies any chest pain fever chills nausea vomiting abdominal pain or diarrhea.  In addition patient has noticed increasing swelling of the lower extremities bilaterally over the last few days.  In the ER patient was afebrile chest x-ray was not showing anything acute.  Had some bloody discharge from the trach area.  On-call pulmonary critical care was consulted and patient placed on nebulizer treatment IV steroids for COPD exacerbation.  Patient's right upper extremity PICC line area looks slightly red and erythematous for which PICC line was removed and placed on empiric antibiotics after blood cultures obtained.  Per report patient had recent Candida UTI and also had Pseudomonas pneumonia.  Patient also was given Lasix 20 mg IV in the ER for lower extremity edema.  Patient's labs show creatinine of 0.5 sodium 140 potassium 4.7 BNP 75.2 hemoglobin 10.3 platelets 460.  EKG sinus tachycardia.  Assessment & Plan:   Principal Problem:   Acute respiratory failure with hypoxia (HCC) Active Problems:   COPD exacerbation (HCC)   Essential hypertension   Pressure injury of skin  Acute on chronic respiratory failure with hypoxia COPD exacerbation Trach and vent dependence Patient presenting from [facility] with progressive shortness of breath.  Patient was admitted to the intensive care unit and placed on full vent support.  Respiratory viral panel and COVID-19 test were both negative.  Patient was started on scheduled nebulized treatments and completed a 5-day course of IV  steroids and antibiotics with cefepime.  Blood cultures 1 out of 4 were positive for coagulase-negative Staphylococcus which was likely contaminant.  Patient did well and was transitioned off of vent support back to trach collar. --Continue trach collar, at 28% FiO2; although patient states cannot tolerate an uncuffed trach --vent prn --Unfortunately patient does not want to return back to [facility]; and SNF require uncuffed trach with maximum FiO2 of 28% and trach would also have to be 17 days old prior to dc per CM. --Patient states working with family to see if there are other options in the [place] area --Continue respiratory therapy with nebs and suctioning as needed --Case management for coordination  Essential hypertension --Continue lisinopril 10 mg p.o. daily  Anemia Hemoglobin 8.9 today with MCV of 89.6.;  Stable  BPH: Finasteride 5 mg p.o. daily  HLD: Continue statin  Anxiety:  Continue alprazolam 0.5 mg p.o. TID prn and clonidine 0.1 mg qHS  GERD: Continue PPI   DVT prophylaxis: Lovenox Code Status: Full code Family Communication: none Disposition Plan: Continue inpatient, transfer to pulmonary progressive unit, awaiting SNF vs LTAC placement; will likely need LTAC given he states cannot tolerate an uncuffed trach which is not acceptable for SNF level of care per case management.   Consultants:   PCCM -signed off 5/29  Procedures:  Transthoracic echocardiogram 11/29/2018: IMPRESSIONS  1. The left ventricle has normal systolic function with an ejection fraction of 60-65%. The cavity size was normal. Left ventricular diastolic Doppler parameters are consistent with impaired relaxation. No evidence of left ventricular regional wall  motion abnormalities.  2. The right ventricle has normal systolic function. The cavity was mildly enlarged. There is no increase in right ventricular wall thickness. Right ventricular systolic pressure could not be assessed.  3.  Trivial pericardial effusion is present.  4. The aortic root is normal in size and structure.  Vascular ultrasound lower extremities: Summary: Right: There is no evidence of deep vein thrombosis in the lower extremity. No cystic structure found in the popliteal fossa. Left: There is no evidence of deep vein thrombosis in the lower extremity. No cystic structure found in the popliteal fossa.  Antimicrobials:   Cefepime 5/26 - 5/29  Vancomycin 5/26 - 5/28   Subjective: Patient seen and examined at bedside, sitting in bedside chair.  Continues on trach collar with FiO2 28%.  Continues without vent support over the past 4 days.  No other complaints at this time.  Denies headache, no fever/chills/night sweats, no nausea/vomiting/diarrhea, no chest pain, no palpitations, no shortness of breath, no issues with bowel/bladder function, no cough/congestion.  No acute events overnight per nursing staff.  Objective: Vitals:   12/05/18 0858 12/05/18 1000 12/05/18 1130 12/05/18 1224  BP:  118/85    Pulse: (!) 101 97    Resp:   18   Temp:    (!) 97.5 F (36.4 C)  TempSrc:    Oral  SpO2: 100% 98%    Weight:      Height:        Intake/Output Summary (Last 24 hours) at 12/05/2018 1229 Last data filed at 12/05/2018 0700 Gross per 24 hour  Intake 1230 ml  Output 700 ml  Net 530 ml   Filed Weights   11/30/18 0500 12/01/18 0500 12/02/18 0500  Weight: 56.9 kg 56.9 kg 56.9 kg    Examination:  General exam: Chronically ill in appearance, no acute distress, on trach collar Respiratory system: Clear to auscultation. Respiratory effort normal.  On 28% FiO2 per trach collar Cardiovascular system: S1 & S2 heard, RRR. No JVD, murmurs, rubs, gallops or clicks. No pedal edema. Gastrointestinal system: Abdomen is nondistended, soft and nontender. No organomegaly or masses felt. Normal bowel sounds heard. Central nervous system: Alert and oriented. No focal neurological deficits. Extremities: Symmetric 5  x 5 power. Skin: No rashes, lesions or ulcers Psychiatry: Judgement and insight appear normal.  Anxious    Data Reviewed: I have personally reviewed following labs and imaging studies  CBC: Recent Labs  Lab 11/28/18 2346  12/01/18 0317 12/02/18 0152 12/03/18 0752 12/04/18 0238 12/05/18 0359  WBC 16.6*   < > 8.2 9.7 12.4* 9.1 10.7*  NEUTROABS 13.3*  --   --   --   --   --   --   HGB 10.3*   < > 7.7* 8.4* 9.1* 8.7* 8.9*  HCT 35.3*   < > 25.7* 28.2* 31.1* 28.9* 29.4*  MCV 92.2   < > 88.6 90.1 89.6 87.3 87.2  PLT 460*   < > 312 330 382 323 360   < > = values in this interval not displayed.   Basic Metabolic Panel: Recent Labs  Lab 11/30/18 0551 12/01/18 0317 12/02/18 0152 12/03/18 0304 12/04/18 0238 12/05/18 0359  NA  --  140 141 141 141 140  K  --  4.4 4.2 3.9 3.8 4.1  CL  --  105 103 101 99 103  CO2  --  '27 28 30 '$ 32 29  GLUCOSE  --  135* 125* 93 130* 142*  BUN  --  26* 23 21  23 21  CREATININE  --  0.53* 0.45* 0.50* 0.47* 0.51*  CALCIUM  --  9.2 9.3 9.3 9.3 9.0  MG 2.3 2.1 2.1 2.1 2.1  --   PHOS 4.0 3.3 3.1 3.3 3.6  --    GFR: Estimated Creatinine Clearance: 71.1 mL/min (A) (by C-G formula based on SCr of 0.51 mg/dL (L)). Liver Function Tests: Recent Labs  Lab 11/28/18 2346 12/01/18 0317  AST 18 17  ALT 19 20  ALKPHOS 84 51  BILITOT 0.3 0.4  PROT 7.2 5.9*  ALBUMIN 3.5 2.9*   No results for input(s): LIPASE, AMYLASE in the last 168 hours. No results for input(s): AMMONIA in the last 168 hours. Coagulation Profile: No results for input(s): INR, PROTIME in the last 168 hours. Cardiac Enzymes: No results for input(s): CKTOTAL, CKMB, CKMBINDEX, TROPONINI in the last 168 hours. BNP (last 3 results) No results for input(s): PROBNP in the last 8760 hours. HbA1C: No results for input(s): HGBA1C in the last 72 hours. CBG: Recent Labs  Lab 12/04/18 0905 12/04/18 1147 12/04/18 1555 12/05/18 0659 12/05/18 0742  GLUCAP 121* 74 103* 51* 88   Lipid  Profile: No results for input(s): CHOL, HDL, LDLCALC, TRIG, CHOLHDL, LDLDIRECT in the last 72 hours. Thyroid Function Tests: No results for input(s): TSH, T4TOTAL, FREET4, T3FREE, THYROIDAB in the last 72 hours. Anemia Panel: No results for input(s): VITAMINB12, FOLATE, FERRITIN, TIBC, IRON, RETICCTPCT in the last 72 hours. Sepsis Labs: Recent Labs  Lab 11/28/18 2346 11/29/18 1446  LATICACIDVEN 1.3 2.4*    Recent Results (from the past 240 hour(s))  Blood Culture (routine x 2)     Status: None   Collection Time: 11/28/18 11:50 PM  Result Value Ref Range Status   Specimen Description SITE NOT SPECIFIED  Final   Special Requests   Final    BOTTLES DRAWN AEROBIC AND ANAEROBIC Blood Culture adequate volume   Culture   Final    NO GROWTH 5 DAYS Performed at [facility], [address] [place], [place] [address]    Report Status 12/04/2018 FINAL  Final  SARS Coronavirus 2 (CEPHEID - Performed in [place] hospital lab), Hosp Order     Status: None   Collection Time: 11/28/18 11:57 PM  Result Value Ref Range Status   SARS Coronavirus 2 NEGATIVE NEGATIVE Final    Comment: (NOTE) If result is NEGATIVE SARS-CoV-2 target nucleic acids are NOT DETECTED. The SARS-CoV-2 RNA is generally detectable in upper and lower  respiratory specimens during the acute phase of infection. The lowest  concentration of SARS-CoV-2 viral copies this assay can detect is 250  copies / mL. A negative result does not preclude SARS-CoV-2 infection  and should not be used as the sole basis for treatment or other  patient management decisions.  A negative result may occur with  improper specimen collection / handling, submission of specimen other  than nasopharyngeal swab, presence of viral mutation(s) within the  areas targeted by this assay, and inadequate number of viral copies  (<250 copies / mL). A negative result must be combined with clinical  observations, patient history, and epidemiological  information. If result is POSITIVE SARS-CoV-2 target nucleic acids are DETECTED. The SARS-CoV-2 RNA is generally detectable in upper and lower  respiratory specimens dur ing the acute phase of infection.  Positive  results are indicative of active infection with SARS-CoV-2.  Clinical  correlation with patient history and other diagnostic information is  necessary to determine patient infection status.  Positive results do  not rule out bacterial infection or co-infection with other viruses. If result is PRESUMPTIVE POSTIVE SARS-CoV-2 nucleic acids MAY BE PRESENT.   A presumptive positive result was obtained on the submitted specimen  and confirmed on repeat testing.  While 2019 novel coronavirus  (SARS-CoV-2) nucleic acids may be present in the submitted sample  additional confirmatory testing may be necessary for epidemiological  and / or clinical management purposes  to differentiate between  SARS-CoV-2 and other Sarbecovirus currently known to infect humans.  If clinically indicated additional testing with an alternate test  methodology [phone]) is advised. The SARS-CoV-2 RNA is generally  detectable in upper and lower respiratory sp ecimens during the acute  phase of infection. The expected result is Negative. Fact Sheet for Patients:  StrictlyIdeas.no Fact Sheet for Healthcare Providers: BankingDealers.co.za This test is not yet approved or cleared by the [place] FDA and has been authorized for detection and/or diagnosis of SARS-CoV-2 by FDA under an Emergency Use Authorization (EUA).  This EUA will remain in effect (meaning this test can be used) for the duration of the COVID-19 declaration under Section 564(b)(1) of the Act, 21 U.S.C. section 360bbb-3(b)(1), unless the authorization is terminated or revoked sooner. Performed at [facility], [place] [address] [place], [place] [address]   Blood Culture (routine x 2)      Status: Abnormal   Collection Time: 11/29/18  2:00 AM  Result Value Ref Range Status   Specimen Description BLOOD RIGHT HAND  Final   Special Requests   Final    BOTTLES DRAWN AEROBIC AND ANAEROBIC Blood Culture results may not be optimal due to an inadequate volume of blood received in culture bottles   Culture  Setup Time   Final    GRAM POSITIVE COCCI IN CLUSTERS ANAEROBIC BOTTLE ONLY CRITICAL RESULT CALLED TO, READ BACK BY AND VERIFIED WITH: Cristopher Estimable PharmD 15:30 11/30/18 ([email or web address])    Culture (A)  Final    STAPHYLOCOCCUS SPECIES (COAGULASE NEGATIVE) THE SIGNIFICANCE OF ISOLATING THIS ORGANISM FROM A SINGLE SET OF BLOOD CULTURES WHEN MULTIPLE SETS ARE DRAWN IS UNCERTAIN. PLEASE NOTIFY THE MICROBIOLOGY DEPARTMENT WITHIN ONE WEEK IF SPECIATION AND SENSITIVITIES ARE REQUIRED. Performed at [facility], [place] [address], [place], [place] [address]    Report Status 12/02/2018 FINAL  Final  Blood Culture ID Panel (Reflexed)     Status: Abnormal   Collection Time: 11/29/18  2:00 AM  Result Value Ref Range Status   Enterococcus species NOT DETECTED NOT DETECTED Final   Listeria monocytogenes NOT DETECTED NOT DETECTED Final   Staphylococcus species DETECTED (A) NOT DETECTED Final    Comment: Methicillin (oxacillin) resistant coagulase negative staphylococcus. Possible blood culture contaminant (unless isolated from more than one blood culture draw or clinical case suggests pathogenicity). No antibiotic treatment is indicated for blood  culture contaminants. CRITICAL RESULT CALLED TO, READ BACK BY AND VERIFIED WITH: Cristopher Estimable PharmD 15:30 11/30/18 ([email or web address])    Staphylococcus aureus (BCID) NOT DETECTED NOT DETECTED Final   Methicillin resistance DETECTED (A) NOT DETECTED Final    Comment: CRITICAL RESULT CALLED TO, READ BACK BY AND VERIFIED WITH: Cristopher Estimable PharmD 15:30 11/30/18 ([email or web address])    Streptococcus species NOT DETECTED NOT DETECTED Final   Streptococcus agalactiae NOT  DETECTED NOT DETECTED Final   Streptococcus pneumoniae NOT DETECTED NOT DETECTED Final   Streptococcus pyogenes NOT DETECTED NOT DETECTED Final   Acinetobacter baumannii NOT DETECTED NOT DETECTED Final   Enterobacteriaceae species NOT DETECTED  NOT DETECTED Final   Enterobacter cloacae complex NOT DETECTED NOT DETECTED Final   Escherichia coli NOT DETECTED NOT DETECTED Final   Klebsiella oxytoca NOT DETECTED NOT DETECTED Final   Klebsiella pneumoniae NOT DETECTED NOT DETECTED Final   Proteus species NOT DETECTED NOT DETECTED Final   Serratia marcescens NOT DETECTED NOT DETECTED Final   Haemophilus influenzae NOT DETECTED NOT DETECTED Final   Neisseria meningitidis NOT DETECTED NOT DETECTED Final   Pseudomonas aeruginosa NOT DETECTED NOT DETECTED Final   Candida albicans NOT DETECTED NOT DETECTED Final   Candida glabrata NOT DETECTED NOT DETECTED Final   Candida krusei NOT DETECTED NOT DETECTED Final   Candida parapsilosis NOT DETECTED NOT DETECTED Final   Candida tropicalis NOT DETECTED NOT DETECTED Final    Comment: Performed at [facility], [place] [address] [place], [place] [address]  MRSA PCR Screening     Status: None   Collection Time: 11/29/18 10:58 AM  Result Value Ref Range Status   MRSA by PCR NEGATIVE NEGATIVE Final    Comment:        The GeneXpert MRSA Assay (FDA approved for NASAL specimens only), is one component of a comprehensive MRSA colonization surveillance program. It is not intended to diagnose MRSA infection nor to guide or monitor treatment for MRSA infections. Performed at [facility], [place] [address] [place], [place] [address]   Respiratory Panel by PCR     Status: None   Collection Time: 11/29/18  4:02 PM  Result Value Ref Range Status   Adenovirus NOT DETECTED NOT DETECTED Final   Coronavirus 229E NOT DETECTED NOT DETECTED Final    Comment: (NOTE) The Coronavirus on the Respiratory Panel, DOES NOT test for the novel  Coronavirus (2019  nCoV)    Coronavirus HKU1 NOT DETECTED NOT DETECTED Final   Coronavirus NL63 NOT DETECTED NOT DETECTED Final   Coronavirus OC43 NOT DETECTED NOT DETECTED Final   Metapneumovirus NOT DETECTED NOT DETECTED Final   Rhinovirus / Enterovirus NOT DETECTED NOT DETECTED Final   Influenza A NOT DETECTED NOT DETECTED Final   Influenza B NOT DETECTED NOT DETECTED Final   Parainfluenza Virus 1 NOT DETECTED NOT DETECTED Final   Parainfluenza Virus 2 NOT DETECTED NOT DETECTED Final   Parainfluenza Virus 3 NOT DETECTED NOT DETECTED Final   Parainfluenza Virus 4 NOT DETECTED NOT DETECTED Final   Respiratory Syncytial Virus NOT DETECTED NOT DETECTED Final   Bordetella pertussis NOT DETECTED NOT DETECTED Final   Chlamydophila pneumoniae NOT DETECTED NOT DETECTED Final   Mycoplasma pneumoniae NOT DETECTED NOT DETECTED Final    Comment: Performed at [facility], [place]. [address] [place], [place] [address]         Radiology Studies: No results found.      Scheduled Meds:  atorvastatin  20 mg Oral q1800   chlorhexidine gluconate (MEDLINE KIT)  15 mL Mouth Rinse BID   Chlorhexidine Gluconate Cloth  6 each Topical Daily   enoxaparin (LOVENOX) injection  40 mg Subcutaneous Q24H   finasteride  5 mg Oral Daily   ipratropium-albuterol  3 mL Nebulization Q6H   lisinopril  10 mg Per Tube Daily   mouth rinse  15 mL Mouth Rinse 10 times per day   pantoprazole sodium  40 mg Per Tube Daily   Continuous Infusions:  feeding supplement (JEVITY 1.2 CAL) 60 mL/hr at 12/05/18 1100     LOS: 6 days    Time spent: 28 minutes  Yonas Bunda J [place] DO [facility] Pager [phone]  If 7PM-7AM, please contact night-coverage [email or web address] Password [facility] 12/05/2018, 12:29 PM

## 2018-12-06 LAB — GLUCOSE, CAPILLARY
Glucose-Capillary: 32 mg/dL — CL (ref 70–99)
Glucose-Capillary: 63 mg/dL — ABNORMAL LOW (ref 70–99)
Glucose-Capillary: 37 mg/dL — CL (ref 70–99)
Glucose-Capillary: 99 mg/dL (ref 70–99)
Glucose-Capillary: 121 mg/dL — ABNORMAL HIGH (ref 70–99)
Glucose-Capillary: 132 mg/dL — ABNORMAL HIGH (ref 70–99)
Glucose-Capillary: 125 mg/dL — ABNORMAL HIGH (ref 70–99)
Glucose-Capillary: 121 mg/dL — ABNORMAL HIGH (ref 70–99)

## 2018-12-06 MED ORDER — ALPRAZOLAM 0.5 MG PO TABS
1.0000 mg | ORAL_TABLET | Freq: Three times a day (TID) | ORAL | Status: DC | PRN
  Administered 2018-12-06: 0.5 mg via ORAL
  Administered 2018-12-06 – 2018-12-09 (×7): 1 mg via ORAL
  Filled 2018-12-06 (×9): qty 2

## 2018-12-06 MED ORDER — MORPHINE SULFATE (PF) 2 MG/ML IV SOLN
1.0000 mg | INTRAVENOUS | Status: DC | PRN
  Administered 2018-12-06 – 2018-12-14 (×15): 1 mg via INTRAVENOUS
  Filled 2018-12-06 (×16): qty 1

## 2018-12-06 NOTE — Evaluation (Signed)
[employer] Speaking Valve - Evaluation Patient Details  Name: Robert Pham MRN: [id number] Date of Birth: 1950-07-12  Today's Date: 12/06/2018 Time: 1115-1130 SLP Time Calculation (min) (ACUTE ONLY): 15 min  Past Medical History:  Past Medical History:  Diagnosis Date  . Anemia   . Anxiety   . BPH (benign prostatic hyperplasia)   . COPD (chronic obstructive pulmonary disease) (HCC)   . GERD (gastroesophageal reflux disease)   . Hyperlipidemia   . Hypertension   . Major depressive disorder   . Ventilator dependent [facility])    Past Surgical History: History reviewed. No pertinent surgical history. HPI:  68 y.o. male with history of COPD with trach/PEG and vent dependent presently on transplant list at [facility], hypertension presents to the ER with complaints of increasing shortness of breath over the last 3 days and has been having increasing bloody discharge over the last 1 to 2 weeks. Patient admitted from [facility].   Assessment / Plan / Recommendation Clinical Impression  Pt had his own speaking valve ([employer] brand) that he brought with him from [facility]. He believes he got it from the facility he was at prior to [facility], although he cannot remember the name. He said that he wears the valve for no more than 15 minutes at a time because otherwise it "takes his breath away" and says that he has not worked with speech therapy since he has been at [facility]. Today pt did not tolerate cuff deflation well despite slow, gradual removal of air from cuff. He expectorated small amount of secretions tracheally, and produced clear, audible phonation with digital occlusion, but he very quickly had changes in vitals that warranted reinflation. SpO2 reached as low as 85%; RR entered the low 30s; pt was visibly demonstrating increased WOB and discomfort. With cuff reinflation, Min cues for slower/deeper breaths, and additional time, pt gradually returned to his baseline. Pt is not yet ready to try his speaking  valve, but would recommend SLP f/u to work on pre-valve activities, such as working up to full cuff deflation. He communicates well at the moment via writing, mouthing, and gesturing. Will continue to support alternative communication methods as indicated. SLP Visit Diagnosis: Aphonia (R49.1)    SLP Assessment  Patient needs continued Speech Lanaguage Pathology Services    Follow Up Recommendations  Skilled Nursing facility    Frequency and Duration min 2x/week  2 weeks    PMSV Trial PMSV was placed for: 0   Tracheostomy Tube       Vent Dependency  FiO2 (%): 28 %    Cuff Deflation Trial  GO Tolerated Cuff Deflation: No Length of Time for Cuff Deflation Trial: ~60 seconds Behavior: Alert;Controlled;Cooperative        Virl Axe Merrissa Giacobbe 12/06/2018, 1:01 PM   Ivar Drape, M.A. CCC-SLP Acute Herbalist [phone] Office [phone]

## 2018-12-06 NOTE — Progress Notes (Signed)
PROGRESS NOTE    Robert Pham  ACZ:[id number] DOB: 09-25-1950 DOA: 11/28/2018 PCP: Townsend Roger, MD    Brief Narrative:   Robert Pham is a 68 y.o. 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  Denies any chest pain fever chills nausea vomiting abdominal pain or diarrhea.  In addition patient has noticed increasing swelling of the lower extremities bilaterally over the last few days.  In the ER patient was afebrile chest x-ray was not showing anything acute.  Had some bloody discharge from the trach area.  On-call pulmonary critical care was consulted and patient placed on nebulizer treatment IV steroids for COPD exacerbation.  Patient's right upper extremity PICC line area looks slightly red and erythematous for which PICC line was removed and placed on empiric antibiotics after blood cultures obtained.  Per report patient had recent Candida UTI and also had Pseudomonas pneumonia.  Patient also was given Lasix 20 mg IV in the ER for lower extremity edema.  Patient's labs show creatinine of 0.5 sodium 140 potassium 4.7 BNP 75.2 hemoglobin 10.3 platelets 460.  EKG sinus tachycardia.  Assessment & Plan:   Principal Problem:   Acute respiratory failure with hypoxia (HCC) Active Problems:   COPD exacerbation (HCC)   Essential hypertension   Pressure injury of skin  Acute on chronic respiratory failure with hypoxia COPD exacerbation Trach and vent dependence Patient presenting from [facility] with progressive shortness of breath.  Patient was admitted to the intensive care unit and placed on full vent support.  Respiratory viral panel and COVID-19 test were both negative.  Patient was started on scheduled nebulized treatments and completed a 5-day course of IV  steroids and antibiotics with cefepime.  Blood cultures 1 out of 4 were positive for coagulase-negative Staphylococcus which was likely contaminant.  Patient did well and was transitioned off of vent support back to trach collar. --Continue trach collar, at 28% FiO2; although patient states cannot tolerate an uncuffed trach --vent prn --Unfortunately patient does not want to return back to [facility]; and SNF require uncuffed trach with maximum FiO2 of 28% and trach would also have to be 52 days old prior to dc per CM. --Patient states working with family to Pham if there are other options in the [place] area --Continue respiratory therapy with nebs and suctioning as needed --Speech therapy evaluation for Passy-Muir valve --Case management for coordination  Essential hypertension --Continue lisinopril 10 mg p.o. daily  Anemia Hemoglobin 8.9;  Stable  BPH: Finasteride 5 mg p.o. daily  HLD: Continue statin  Anxiety:  Continue alprazolam 0.5 mg p.o. TID prn and clonidine 0.1 mg qHS  GERD: Continue PPI   DVT prophylaxis: Lovenox Code Status: Full code Family Communication: none Disposition Plan: Continue inpatient, transfer to pulmonary progressive unit today, awaiting SNF vs LTAC placement; will likely need LTAC given he states cannot tolerate an uncuffed trach which is not acceptable for SNF level of care per case management.   Consultants:   PCCM -signed off 5/29  Procedures:  Transthoracic echocardiogram 11/29/2018: IMPRESSIONS  1. The left ventricle has normal systolic function with an ejection fraction of 60-65%. The cavity size was normal. Left ventricular diastolic Doppler parameters are consistent with impaired relaxation. No evidence of left ventricular regional wall  motion abnormalities.  2. The right ventricle has normal systolic function. The cavity was mildly enlarged. There is no increase in right ventricular wall thickness. Right ventricular systolic pressure  could not be assessed.  3. Trivial pericardial effusion is present.  4. The aortic root is normal in size and structure.  Vascular ultrasound lower extremities: Summary: Right: There is no evidence of deep vein thrombosis in the lower extremity. No cystic structure found in the popliteal fossa. Left: There is no evidence of deep vein thrombosis in the lower extremity. No cystic structure found in the popliteal fossa.  Antimicrobials:   Cefepime 5/26 - 5/29  Vancomycin 5/26 - 5/28   Subjective: Patient seen and examined at bedside, sitting in bedside chair.  Continues on trach collar with FiO2 28%.  Continues without vent support over the past 5 days.  No other complaints at this time.  Denies headache, no fever/chills/night sweats, no nausea/vomiting/diarrhea, no chest pain, no palpitations, no shortness of breath, no issues with bowel/bladder function, no cough/congestion.  No acute events overnight per nursing staff.  Pending transfer to pulmonary progressive floor today.  Objective: Vitals:   12/06/18 0500 12/06/18 0600 12/06/18 0722 12/06/18 0809  BP: 110/64 109/90 (!) 89/76 97/80  Pulse: 87 84 87 82  Resp:    (!) 22  Temp:      TempSrc:      SpO2: 99% 100% 100%   Weight:      Height:        Intake/Output Summary (Last 24 hours) at 12/06/2018 1026 Last data filed at 12/06/2018 0600 Gross per 24 hour  Intake 720 ml  Output 1000 ml  Net -280 ml   Filed Weights   12/01/18 0500 12/02/18 0500 12/06/18 0338  Weight: 56.9 kg 56.9 kg 55.8 kg    Examination:  General exam: Chronically ill in appearance, no acute distress, on trach collar Respiratory system: Clear to auscultation. Respiratory effort normal.  On 28% FiO2 per trach collar Cardiovascular system: S1 & S2 heard, RRR. No JVD, murmurs, rubs, gallops or clicks. No pedal edema. Gastrointestinal system: Abdomen is nondistended, soft and nontender. No organomegaly or masses felt. Normal bowel sounds heard. Central  nervous system: Alert and oriented. No focal neurological deficits. Extremities: Symmetric 5 x 5 power. Skin: No rashes, lesions or ulcers Psychiatry: Judgement and insight appear normal.  Anxious    Data Reviewed: I have personally reviewed following labs and imaging studies  CBC: Recent Labs  Lab 12/01/18 0317 12/02/18 0152 12/03/18 0752 12/04/18 0238 12/05/18 0359  WBC 8.2 9.7 12.4* 9.1 10.7*  HGB 7.7* 8.4* 9.1* 8.7* 8.9*  HCT 25.7* 28.2* 31.1* 28.9* 29.4*  MCV 88.6 90.1 89.6 87.3 87.2  PLT 312 330 382 323 863   Basic Metabolic Panel: Recent Labs  Lab 11/30/18 0551 12/01/18 0317 12/02/18 0152 12/03/18 0304 12/04/18 0238 12/05/18 0359  NA  --  140 141 141 141 140  K  --  4.4 4.2 3.9 3.8 4.1  CL  --  105 103 101 99 103  CO2  --  '27 28 30 '$ 32 29  GLUCOSE  --  135* 125* 93 130* 142*  BUN  --  26* '23 21 23 21  '$ CREATININE  --  0.53* 0.45* 0.50* 0.47* 0.51*  CALCIUM  --  9.2 9.3 9.3 9.3 9.0  MG 2.3 2.1 2.1 2.1 2.1  --   PHOS 4.0 3.3 3.1 3.3 3.6  --    GFR: Estimated Creatinine Clearance: 69.8 mL/min (A) (by  C-G formula based on SCr of 0.51 mg/dL (L)). Liver Function Tests: Recent Labs  Lab 12/01/18 0317  AST 17  ALT 20  ALKPHOS 51  BILITOT 0.4  PROT 5.9*  ALBUMIN 2.9*   No results for input(s): LIPASE, AMYLASE in the last 168 hours. No results for input(s): AMMONIA in the last 168 hours. Coagulation Profile: No results for input(s): INR, PROTIME in the last 168 hours. Cardiac Enzymes: No results for input(s): CKTOTAL, CKMB, CKMBINDEX, TROPONINI in the last 168 hours. BNP (last 3 results) No results for input(s): PROBNP in the last 8760 hours. HbA1C: No results for input(s): HGBA1C in the last 72 hours. CBG: Recent Labs  Lab 12/05/18 2347 12/06/18 0348 12/06/18 0435 12/06/18 0512 12/06/18 0749  GLUCAP 103* 63* 37* 99 121*   Lipid Profile: No results for input(s): CHOL, HDL, LDLCALC, TRIG, CHOLHDL, LDLDIRECT in the last 72 hours. Thyroid Function  Tests: No results for input(s): TSH, T4TOTAL, FREET4, T3FREE, THYROIDAB in the last 72 hours. Anemia Panel: No results for input(s): VITAMINB12, FOLATE, FERRITIN, TIBC, IRON, RETICCTPCT in the last 72 hours. Sepsis Labs: Recent Labs  Lab 11/29/18 1446  LATICACIDVEN 2.4*    Recent Results (from the past 240 hour(s))  Blood Culture (routine x 2)     Status: None   Collection Time: 11/28/18 11:50 PM  Result Value Ref Range Status   Specimen Description SITE NOT SPECIFIED  Final   Special Requests   Final    BOTTLES DRAWN AEROBIC AND ANAEROBIC Blood Culture adequate volume   Culture   Final    NO GROWTH 5 DAYS Performed at [facility], [address], [place], [place] [address]    Report Status 12/04/2018 FINAL  Final  SARS Coronavirus 2 (CEPHEID - Performed in [place] hospital lab), Hosp Order     Status: None   Collection Time: 11/28/18 11:57 PM  Result Value Ref Range Status   SARS Coronavirus 2 NEGATIVE NEGATIVE Final    Comment: (NOTE) If result is NEGATIVE SARS-CoV-2 target nucleic acids are NOT DETECTED. The SARS-CoV-2 RNA is generally detectable in upper and lower  respiratory specimens during the acute phase of infection. The lowest  concentration of SARS-CoV-2 viral copies this assay can detect is 250  copies / mL. A negative result does not preclude SARS-CoV-2 infection  and should not be used as the sole basis for treatment or other  patient management decisions.  A negative result may occur with  improper specimen collection / handling, submission of specimen other  than nasopharyngeal swab, presence of viral mutation(s) within the  areas targeted by this assay, and inadequate number of viral copies  (<250 copies / mL). A negative result must be combined with clinical  observations, patient history, and epidemiological information. If result is POSITIVE SARS-CoV-2 target nucleic acids are DETECTED. The SARS-CoV-2 RNA is generally detectable in upper and  lower  respiratory specimens dur ing the acute phase of infection.  Positive  results are indicative of active infection with SARS-CoV-2.  Clinical  correlation with patient history and other diagnostic information is  necessary to determine patient infection status.  Positive results do  not rule out bacterial infection or co-infection with other viruses. If result is PRESUMPTIVE POSTIVE SARS-CoV-2 nucleic acids MAY BE PRESENT.   A presumptive positive result was obtained on the submitted specimen  and confirmed on repeat testing.  While 2019 novel coronavirus  (SARS-CoV-2) nucleic acids may be present in the submitted sample  additional confirmatory  testing may be necessary for epidemiological  and / or clinical management purposes  to differentiate between  SARS-CoV-2 and other Sarbecovirus currently known to infect humans.  If clinically indicated additional testing with an alternate test  methodology [phone]) is advised. The SARS-CoV-2 RNA is generally  detectable in upper and lower respiratory sp ecimens during the acute  phase of infection. The expected result is Negative. Fact Sheet for Patients:  StrictlyIdeas.no Fact Sheet for Healthcare Providers: BankingDealers.co.za This test is not yet approved or cleared by the [place] FDA and has been authorized for detection and/or diagnosis of SARS-CoV-2 by FDA under an Emergency Use Authorization (EUA).  This EUA will remain in effect (meaning this test can be used) for the duration of the COVID-19 declaration under Section 564(b)(1) of the Act, 21 U.S.C. section 360bbb-3(b)(1), unless the authorization is terminated or revoked sooner. Performed at [facility], [place] [address], [place], [place] [address]   Blood Culture (routine x 2)     Status: Abnormal   Collection Time: 11/29/18  2:00 AM  Result Value Ref Range Status   Specimen Description BLOOD RIGHT HAND  Final    Special Requests   Final    BOTTLES DRAWN AEROBIC AND ANAEROBIC Blood Culture results may not be optimal due to an inadequate volume of blood received in culture bottles   Culture  Setup Time   Final    GRAM POSITIVE COCCI IN CLUSTERS ANAEROBIC BOTTLE ONLY CRITICAL RESULT CALLED TO, READ BACK BY AND VERIFIED WITH: Cristopher Estimable PharmD 15:30 11/30/18 ([email or web address])    Culture (A)  Final    STAPHYLOCOCCUS SPECIES (COAGULASE NEGATIVE) THE SIGNIFICANCE OF ISOLATING THIS ORGANISM FROM A SINGLE SET OF BLOOD CULTURES WHEN MULTIPLE SETS ARE DRAWN IS UNCERTAIN. PLEASE NOTIFY THE MICROBIOLOGY DEPARTMENT WITHIN ONE WEEK IF SPECIATION AND SENSITIVITIES ARE REQUIRED. Performed at [facility], [place] [address], [place], [place] [address]    Report Status 12/02/2018 FINAL  Final  Blood Culture ID Panel (Reflexed)     Status: Abnormal   Collection Time: 11/29/18  2:00 AM  Result Value Ref Range Status   Enterococcus species NOT DETECTED NOT DETECTED Final   Listeria monocytogenes NOT DETECTED NOT DETECTED Final   Staphylococcus species DETECTED (A) NOT DETECTED Final    Comment: Methicillin (oxacillin) resistant coagulase negative staphylococcus. Possible blood culture contaminant (unless isolated from more than one blood culture draw or clinical case suggests pathogenicity). No antibiotic treatment is indicated for blood  culture contaminants. CRITICAL RESULT CALLED TO, READ BACK BY AND VERIFIED WITH: Cristopher Estimable PharmD 15:30 11/30/18 ([email or web address])    Staphylococcus aureus (BCID) NOT DETECTED NOT DETECTED Final   Methicillin resistance DETECTED (A) NOT DETECTED Final    Comment: CRITICAL RESULT CALLED TO, READ BACK BY AND VERIFIED WITH: Cristopher Estimable PharmD 15:30 11/30/18 ([email or web address])    Streptococcus species NOT DETECTED NOT DETECTED Final   Streptococcus agalactiae NOT DETECTED NOT DETECTED Final   Streptococcus pneumoniae NOT DETECTED NOT DETECTED Final   Streptococcus pyogenes NOT DETECTED NOT DETECTED  Final   Acinetobacter baumannii NOT DETECTED NOT DETECTED Final   Enterobacteriaceae species NOT DETECTED NOT DETECTED Final   Enterobacter cloacae complex NOT DETECTED NOT DETECTED Final   Escherichia coli NOT DETECTED NOT DETECTED Final   Klebsiella oxytoca NOT DETECTED NOT DETECTED Final   Klebsiella pneumoniae NOT DETECTED NOT DETECTED Final   Proteus species NOT DETECTED NOT DETECTED Final   Serratia marcescens NOT DETECTED NOT DETECTED Final   Haemophilus influenzae  NOT DETECTED NOT DETECTED Final   Neisseria meningitidis NOT DETECTED NOT DETECTED Final   Pseudomonas aeruginosa NOT DETECTED NOT DETECTED Final   Candida albicans NOT DETECTED NOT DETECTED Final   Candida glabrata NOT DETECTED NOT DETECTED Final   Candida krusei NOT DETECTED NOT DETECTED Final   Candida parapsilosis NOT DETECTED NOT DETECTED Final   Candida tropicalis NOT DETECTED NOT DETECTED Final    Comment: Performed at [facility], [place] [address], [place], [place] [address]  MRSA PCR Screening     Status: None   Collection Time: 11/29/18 10:58 AM  Result Value Ref Range Status   MRSA by PCR NEGATIVE NEGATIVE Final    Comment:        The GeneXpert MRSA Assay (FDA approved for NASAL specimens only), is one component of a comprehensive MRSA colonization surveillance program. It is not intended to diagnose MRSA infection nor to guide or monitor treatment for MRSA infections. Performed at [facility], [place] [address], [place], [place] [address]   Respiratory Panel by PCR     Status: None   Collection Time: 11/29/18  4:02 PM  Result Value Ref Range Status   Adenovirus NOT DETECTED NOT DETECTED Final   Coronavirus 229E NOT DETECTED NOT DETECTED Final    Comment: (NOTE) The Coronavirus on the Respiratory Panel, DOES NOT test for the novel  Coronavirus (2019 nCoV)    Coronavirus HKU1 NOT DETECTED NOT DETECTED Final   Coronavirus NL63 NOT DETECTED NOT DETECTED Final   Coronavirus OC43 NOT  DETECTED NOT DETECTED Final   Metapneumovirus NOT DETECTED NOT DETECTED Final   Rhinovirus / Enterovirus NOT DETECTED NOT DETECTED Final   Influenza A NOT DETECTED NOT DETECTED Final   Influenza B NOT DETECTED NOT DETECTED Final   Parainfluenza Virus 1 NOT DETECTED NOT DETECTED Final   Parainfluenza Virus 2 NOT DETECTED NOT DETECTED Final   Parainfluenza Virus 3 NOT DETECTED NOT DETECTED Final   Parainfluenza Virus 4 NOT DETECTED NOT DETECTED Final   Respiratory Syncytial Virus NOT DETECTED NOT DETECTED Final   Bordetella pertussis NOT DETECTED NOT DETECTED Final   Chlamydophila pneumoniae NOT DETECTED NOT DETECTED Final   Mycoplasma pneumoniae NOT DETECTED NOT DETECTED Final    Comment: Performed at [facility], [place]. [address] [place], [place] [address]         Radiology Studies: No results found.      Scheduled Meds:  atorvastatin  20 mg Oral q1800   chlorhexidine gluconate (MEDLINE KIT)  15 mL Mouth Rinse BID   Chlorhexidine Gluconate Cloth  6 each Topical Daily   enoxaparin (LOVENOX) injection  40 mg Subcutaneous Q24H   feeding supplement (PRO-STAT SUGAR FREE 64)  30 mL Per Tube Daily   finasteride  5 mg Oral Daily   ipratropium-albuterol  3 mL Nebulization Q6H   lisinopril  10 mg Per Tube Daily   mouth rinse  15 mL Mouth Rinse 10 times per day   pantoprazole sodium  40 mg Per Tube Daily   Continuous Infusions:  feeding supplement (JEVITY 1.2 CAL) 1,000 mL (12/06/18 0237)     LOS: 7 days    Time spent: 28 minutes    Anagha Loseke J [place] DO [facility] Pager [phone]  If 7PM-7AM, please contact night-coverage [email or web address] Password [email or web address] 12/06/2018, 10:26 AM

## 2018-12-06 NOTE — Evaluation (Signed)
Passy-Muir Speaking Valve - Evaluation Patient Details  Name: Robert Pham MRN: [id number] Date of Birth: October 11, 1950  Today's Date: 12/06/2018 Time: 1130-1151 SLP Time Calculation (min) (ACUTE ONLY): 21 min  Past Medical History:  Past Medical History:  Diagnosis Date  . Anemia   . Anxiety   . BPH (benign prostatic hyperplasia)   . COPD (chronic obstructive pulmonary disease) (HCC)   . GERD (gastroesophageal reflux disease)   . Hyperlipidemia   . Hypertension   . Major depressive disorder   . Ventilator dependent [facility])    Past Surgical History: History reviewed. No pertinent surgical history. HPI:  68 y.o. male with history of COPD with trach/PEG and vent dependent presently on transplant list at [facility], hypertension presents to the ER with complaints of increasing shortness of breath over the last 3 days and has been having increasing bloody discharge over the last 1 to 2 weeks. Patient admitted from [facility].   Assessment / Plan / Recommendation Clinical Impression  Pt was not ready to try POs today due to tachypnea, desaturation with attempts at cuff deflation. Despite return to baseline after cuff was reinflated, pt still demonstrated mildly elevated RR. We focused heavily today on learning his PLOF and providing education about the relationship between respirations and swallowing. Pt shared that prior to getting his trach in February, he had a swallow study completed that was [facility]. Since his trach, he does not recall having any testing done. He does not believe he has been allowed any POs. We discussed his more tenuous respiratory status and the likely need for instrumental testing prior to initiating a diet of any kind. Although he is quite removed from his initial intubation window and his voice sounds clear, he may be quite deconditioned. Would like to see if we can progress him to at least his baseline PMV use prior to testing as well. Pt is in agreement, and is very eager to work on  swallowing. Will continue to follow and progress as able. SLP Visit Diagnosis: Dysphagia, unspecified (R13.10)    SLP Assessment  Patient needs continued Speech Lanaguage Pathology Services    Follow Up Recommendations  Skilled Nursing facility    Frequency and Duration min 2x/week  2 weeks    PMSV Trial PMSV was placed for: 0   Tracheostomy Tube       Vent Dependency  FiO2 (%): 28 %    Cuff Deflation Trial  GO Tolerated Cuff Deflation: No Length of Time for Cuff Deflation Trial: ~60 seconds Behavior: Alert;Controlled;Cooperative        Virl Axe Roshni Burbano 12/06/2018, 1:21 PM   Ivar Drape, M.A. CCC-SLP Acute Herbalist [phone] Office [phone]

## 2018-12-06 NOTE — Progress Notes (Signed)
Physical Therapy Treatment Patient Details Name: Robert Pham MRN: [id number] DOB: 1950/10/30 Today's Date: 12/06/2018    History of Present Illness 68 y.o. 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 Patient admitted from [facility], on chronic trach and vent dependent. Pt is nonverbal and unable to give history. Per chart review, trach was most likely placed sometime in March 2020.    PT Comments    Pt admitted with above diagnosis. Pt currently with functional limitations due to the deficits listed below (see PT Problem List). Pt was able to ambulate in hallway with rest breaks with min guard assist with RW.  Pt does have DOE 3/4 and takes standing rest breaks as he will get anxious when breathing is harder.  Pt needs 40% FiO2  On trach collar to maintain sats even though he is 28% at rest on trach collar.  Will continue to progress pt.   Pt will benefit from skilled PT to increase their independence and safety with mobility to allow discharge to the venue listed below.     Follow Up Recommendations  Supervision/Assistance - 24 hour;SNF(Seems pt will be more appropriate for SNF)     Equipment Recommendations  None recommended by PT    Recommendations for Other Services       Precautions / Restrictions Precautions Precautions: Fall Restrictions Weight Bearing Restrictions: No    Mobility  Bed Mobility Overal bed mobility: Independent             General bed mobility comments: Sitting on EOB upon my arrival  Transfers Overall transfer level: Needs assistance Equipment used: Rolling walker (2 wheeled) Transfers: Sit to/from Stand Sit to Stand: Min guard Stand pivot transfers: Min guard       General transfer comment: Impulsive therefore needs  cues for safety.   Ambulation/Gait Ambulation/Gait assistance: Min guard;Min assist;+2 safety/equipment Gait Distance (Feet): 225 Feet(100 feet, 125 feet) Assistive device: Rolling walker (2 wheeled) Gait Pattern/deviations: Step-through pattern;Decreased stride length;Trunk flexed   Gait velocity interpretation: 1.31 - 2.62 ft/sec, indicative of limited community ambulator General Gait Details: Pt was able to ambulate with RW in hallway.  Pt with overall good steady gait.  Did take a few standing rest breaks.  First walking attempt, coughed up sputum and headed back to be sucitoned even though nurse had suctioned prior to walk.  Second walk, pt made it to desk almost however was too fatigued to walk back to room and had to get someone to bring a chair to pt.  FiO2 was 40% trach collar with sats >90% for the walk.  Other VSS. Pt was on 28% at rest.     Stairs             Wheelchair Mobility    Modified Rankin (Stroke Patients Only)       Balance Overall balance assessment: Needs assistance Sitting-balance support: No upper extremity supported;Feet supported Sitting balance-Leahy Scale: Good     Standing balance support: No upper extremity supported;During functional activity Standing balance-Leahy Scale: Fair Standing balance comment: can stand staticaly without UE support                            Cognition Arousal/Alertness: Awake/alert Behavior During Therapy: Impulsive  Overall Cognitive Status: Within Functional Limits for tasks assessed                                 General Comments: Wants to move fast      Exercises General Exercises - Lower Extremity Long Arc Quad: AROM;Both;10 reps;Seated    General Comments        Pertinent Vitals/Pain Pain Assessment: No/denies pain    Home Living                      Prior Function            PT Goals (current goals can now be found in the care plan section) Acute Rehab  PT Goals Patient Stated Goal: to get better Progress towards PT goals: Progressing toward goals    Frequency    Min 3X/week      PT Plan Discharge plan needs to be updated    Co-evaluation              AM-PAC PT "6 Clicks" Mobility   Outcome Measure  Help needed turning from your back to your side while in a flat bed without using bedrails?: None Help needed moving from lying on your back to sitting on the side of a flat bed without using bedrails?: None Help needed moving to and from a bed to a chair (including a wheelchair)?: None Help needed standing up from a chair using your arms (e.g., wheelchair or bedside chair)?: A Little Help needed to walk in hospital room?: A Little Help needed climbing 3-5 steps with a railing? : A Little 6 Click Score: 21    End of Session Equipment Utilized During Treatment: Gait belt;Oxygen(trach collar 40% for activity, 28% at rest) Activity Tolerance: Patient tolerated treatment well;Patient limited by fatigue Patient left: in chair;with call bell/phone within reach;with chair alarm set(ST present) Nurse Communication: Mobility status PT Visit Diagnosis: Unsteadiness on feet (R26.81);Muscle weakness (generalized) (M62.81)     Time: 7616-0737 PT Time Calculation (min) (ACUTE ONLY): 40 min  Charges:  $Gait Training: 38-52 mins                     Ryenne Lynam,PT Acute Rehabilitation Services Pager:  [phone]  Office:  [phone]     Berline Lopes 12/06/2018, 11:55 AM

## 2018-12-06 NOTE — Progress Notes (Signed)
SLP Cancellation Note  Patient Details Name: Robert Pham MRN: [id number] DOB: 09/01/1950   Cancelled treatment:       Reason Eval/Treat Not Completed: Other (comment) RN giving report, pt transferring to 2W. Will f/u on new unit as able.   Robert Pham 12/06/2018, 9:14 AM  Robert Pham, M.A. CCC-SLP Acute Herbalist [phone] Office [phone]

## 2018-12-06 NOTE — Progress Notes (Signed)
Occupational Therapy Treatment Patient Details Name: Robert Pham MRN: [id number] DOB: Dec 12, 1950 Today's Date: 12/06/2018    History of present illness 68 y.o. 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 Patient admitted from [facility], on chronic trach and vent dependent. Pt is nonverbal and unable to give history. Per chart review, trach was most likely placed sometime in March 2020.   OT comments  This 68 yo male admitted with above presents to acute OT with making progress of going into bathroom to use toilet v. Just beside commode. He gets very anxious with his breathing despite O2 sats remain good.He will continue to benefit from acute OT with follow up OT at SNF.  Follow Up Recommendations  SNF(as long as remains off vent)    Equipment Recommendations  Other (comment)(TBD next venue)       Precautions / Restrictions Precautions Precautions: Fall Restrictions Weight Bearing Restrictions: No       Mobility Bed Mobility               General bed mobility comments: Sitting on EOB upon my arrival  Transfers Overall transfer level: Needs assistance Equipment used: Rolling walker (2 wheeled) Transfers: Sit to/from Stand Sit to Stand: Min guard Stand pivot transfers: Min guard       General transfer comment: Impulsive therefore needs cues for safety.     Balance Overall balance assessment: Needs assistance Sitting-balance support: No upper extremity supported;Feet supported Sitting balance-Leahy Scale: Good     Standing balance support: No upper extremity supported;During functional activity Standing balance-Leahy Scale: Fair Standing balance comment: can stand staticaly without UE support                           ADL  either performed or assessed with clinical judgement   ADL Overall ADL's : Needs assistance/impaired     Grooming: Wash/dry hands;Min guard;Standing               Lower Body Dressing: Min guard;Sit to/from stand   Toilet Transfer: Min guard;Ambulation;RW   Toileting- ArchitectClothing Manipulation and Hygiene: Min guard;Sit to/from stand               Vision Patient Visual Report: No change from baseline            Cognition Arousal/Alertness: Awake/alert Behavior During Therapy: Impulsive Overall Cognitive Status: Within Functional Limits for tasks assessed                                 General Comments: Wants to move fast                   Pertinent Vitals/ Pain       Pain Assessment: No/denies pain            Progress Toward Goals  OT Goals(current goals can now be found in the care plan section)  Progress towards OT goals: Progressing toward goals     Plan Discharge plan remains appropriate       AM-PAC OT "6 Clicks" Daily Activity     Outcome Measure   Help from another person eating meals?: Total(NPO) Help from another person taking  care of personal grooming?: A Little Help from another person toileting, which includes using toliet, bedpan, or urinal?: A Little Help from another person bathing (including washing, rinsing, drying)?: A Little Help from another person to put on and taking off regular upper body clothing?: A Little Help from another person to put on and taking off regular lower body clothing?: A Little 6 Click Score: 16    End of Session Equipment Utilized During Treatment: Gait belt;Oxygen(8 liters at 40%, maintained at 94%or better)  OT Visit Diagnosis: Other abnormalities of gait and mobility (R26.89);Muscle weakness (generalized) (M62.81)   Activity Tolerance Patient tolerated treatment well   Patient Left in chair;with call bell/phone within reach   Nurse Communication Mobility status        Time:  0300-9233 OT Time Calculation (min): 29 min  Charges: OT General Charges $OT Visit: 1 Visit OT Treatments $Self Care/Home Management : 23-37 mins  Ignacia Palma, OTR/L Acute [employer] Pager [phone] Office [phone]      Evette Georges 12/06/2018, 10:32 AM

## 2018-12-06 NOTE — Progress Notes (Signed)
MD paged: Per Pt: Pt does not feel he's ready to be out of ICU-wants to go back. Pt does not feel Xanax strong enough.

## 2018-12-07 DIAGNOSIS — L8990 Pressure ulcer of unspecified site, unspecified stage: Secondary | ICD-10-CM

## 2018-12-07 LAB — BASIC METABOLIC PANEL
Anion gap: 7 (ref 5–15)
BUN: 18 mg/dL (ref 8–23)
CO2: 30 mmol/L (ref 22–32)
Calcium: 9.4 mg/dL (ref 8.9–10.3)
Chloride: 100 mmol/L (ref 98–111)
Creatinine, Ser: 0.53 mg/dL — ABNORMAL LOW (ref 0.61–1.24)
GFR calc Af Amer: >60 mL/min (ref >60)
GFR calc non Af Amer: >60 mL/min (ref >60)
Glucose, Bld: 114 mg/dL — ABNORMAL HIGH (ref 70–99)
Potassium: 3.9 mmol/L (ref 3.5–5.1)
Sodium: 137 mmol/L (ref 135–145)

## 2018-12-07 LAB — CBC
HCT: 33.4 % — ABNORMAL LOW (ref 39.0–52.0)
Hemoglobin: 9.9 g/dL — ABNORMAL LOW (ref 13.0–17.0)
MCH: 26.3 pg (ref 26.0–34.0)
MCHC: 29.6 g/dL — ABNORMAL LOW (ref 30.0–36.0)
MCV: 88.8 fL (ref 80.0–100.0)
Platelets: 366 10*3/uL (ref 150–400)
RBC: 3.76 MIL/uL — ABNORMAL LOW (ref 4.22–5.81)
RDW: 14.9 % (ref 11.5–15.5)
WBC: 9.3 10*3/uL (ref 4.0–10.5)
nRBC: 0 % (ref 0.0–0.2)

## 2018-12-07 LAB — GLUCOSE, CAPILLARY
Glucose-Capillary: 100 mg/dL — ABNORMAL HIGH (ref 70–99)
Glucose-Capillary: 103 mg/dL — ABNORMAL HIGH (ref 70–99)
Glucose-Capillary: 113 mg/dL — ABNORMAL HIGH (ref 70–99)
Glucose-Capillary: 120 mg/dL — ABNORMAL HIGH (ref 70–99)
Glucose-Capillary: 84 mg/dL (ref 70–99)
Glucose-Capillary: 94 mg/dL (ref 70–99)

## 2018-12-07 LAB — MAGNESIUM: Magnesium: 2 mg/dL (ref 1.7–2.4)

## 2018-12-07 MED ORDER — IPRATROPIUM-ALBUTEROL 0.5-2.5 (3) MG/3ML IN SOLN
3.0000 mL | Freq: Two times a day (BID) | RESPIRATORY_TRACT | Status: DC
  Administered 2018-12-07 – 2018-12-14 (×14): 3 mL via RESPIRATORY_TRACT
  Filled 2018-12-07 (×14): qty 3

## 2018-12-07 NOTE — Progress Notes (Signed)
  Speech Language Pathology Treatment: Robert Pham Speaking valve  Patient Details Name: Robert Pham MRN: [id number] DOB: 1951-03-12 Today's Date: 12/07/2018 Time: 3818-2993 SLP Time Calculation (min) (ACUTE ONLY): 37 min  Assessment / Plan / Recommendation Clinical Impression  Today's session focused on cuff deflation, with very gradual deflation and distractions used to ease pt's anxiety. Cuff was progressively deflated over almost 20 min. Meanwhile, pt communicated via writing, mouthing, and gestures with Min requests for repetition. He produced good phonation with use of finger occlusion. He reports not having had any training for use of PMV, asking SLP to put it on while the cuff was still mostly inflated. Education (verbal, written, visual) was provided about breathing patterns with/without PMV, the role of the cuff. Upon full deflation of the cuff pt produced moderate amounts of secretions via trach hub. WOB and RR began to rise. Cuff was reinflated. Will continue to follow with progression to PMV as able.   HPI HPI: 68 y.o. male with history of COPD with trach/PEG and vent dependent presently on transplant list at [facility], hypertension presents to the ER with complaints of increasing shortness of breath over the last 3 days and has been having increasing bloody discharge over the last 1 to 2 weeks. Patient admitted from [facility].      SLP Plan  Continue with current plan of care       Recommendations         Patient may use Passy-Muir Speech Valve: with SLP only         Oral Care Recommendations: Oral care QID Follow up Recommendations: Skilled Nursing facility SLP Visit Diagnosis: Aphonia (R49.1) Plan: Continue with current plan of care       GO                Robert Pham 12/07/2018, 11:35 AM  Ivar Drape, M.A. CCC-SLP Acute Herbalist [phone] Office [phone]

## 2018-12-07 NOTE — Progress Notes (Signed)
Physical Therapy Treatment Patient Details Name: Robert JacksJimmie Pham MRN: [id number] DOB: [birth year] Today's Date: 12/07/2018    History of Present Illness 68 y.o. 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 Patient admitted from [facility], on chronic trach and vent dependent. Pt is nonverbal and unable to give history. Per chart review, trach was most likely placed sometime in March 2020.    PT Comments    Continuing work on functional mobility and activity tolerance;  Notable incr gait distance, and less need for seated rest breaks; Agree that post-acute rehab at SNF seems more appropriate for Mr. Lumbert; Can a SNF accept a patient who needs 40% O2 via trach with activity?   Follow Up Recommendations  Supervision/Assistance - 24 hour;SNF     Equipment Recommendations  None recommended by PT    Recommendations for Other Services       Precautions / Restrictions Precautions Precautions: Fall    Mobility  Bed Mobility                  Transfers Overall transfer level: Needs assistance Equipment used: Rolling walker (2 wheeled) Transfers: Sit to/from Stand Sit to Stand: Min guard         General transfer comment: Impulsive therefore needs cues for safety.   Ambulation/Gait Ambulation/Gait assistance: Min guard;Min assist;+2 safety/equipment Gait Distance (Feet): 200 Feet(one standing rest break at nurse's station) Assistive device: Rolling walker (2 wheeled) Gait Pattern/deviations: Step-through pattern;Decreased stride length;Trunk flexed     General Gait Details: Able to ambulate in hallway, RN performed deeper suction before we began; He is shoeing good self-monitor of activity tolernace; tends to signal to stop and rest in conjucntion with  his O2 sats decreasing to mid 80s; they return to lwo to mid 90s with standing rest break approx 3 minutes; took a few of these standing rest breaks during today's walk   Stairs             Wheelchair Mobility    Modified Rankin (Stroke Patients Only)       Balance     Sitting balance-Leahy Scale: Good       Standing balance-Leahy Scale: Fair                              Cognition Arousal/Alertness: Awake/alert Behavior During Therapy: Impulsive Overall Cognitive Status: Within Functional Limits for tasks assessed                                 General Comments: Wants to move fast      Exercises      General Comments        Pertinent Vitals/Pain Pain Assessment: No/denies pain    Home Living                      Prior Function            PT Goals (current goals can now be found in the care plan section) Acute Rehab PT Goals Patient Stated Goal: to get better PT Goal Formulation: With patient Time For Goal Achievement: 12/14/18 Potential to Achieve Goals: Good Progress towards PT  goals: Progressing toward goals    Frequency    Min 3X/week      PT Plan Current plan remains appropriate    Co-evaluation              AM-PAC PT "6 Clicks" Mobility   Outcome Measure  Help needed turning from your back to your side while in a flat bed without using bedrails?: None Help needed moving from lying on your back to sitting on the side of a flat bed without using bedrails?: None Help needed moving to and from a bed to a chair (including a wheelchair)?: None Help needed standing up from a chair using your arms (e.g., wheelchair or bedside chair)?: A Little Help needed to walk in hospital room?: A Little Help needed climbing 3-5 steps with a railing? : A Little 6 Click Score: 21    End of Session Equipment Utilized During Treatment: Gait belt;Oxygen(40% O2 during amb via trach) Activity Tolerance: Patient  tolerated treatment well Patient left: in chair;with call bell/phone within reach Nurse Communication: Mobility status PT Visit Diagnosis: Unsteadiness on feet (R26.81);Muscle weakness (generalized) (M62.81)     Time: 6712-4580 PT Time Calculation (min) (ACUTE ONLY): 25 min  Charges:  $Gait Training: 23-37 mins                     Van Clines, [place]  Acute Rehabilitation Services Pager [phone] Office [phone]    Levi Aland 12/07/2018, 4:30 PM

## 2018-12-07 NOTE — Plan of Care (Signed)
  Problem: Education: Goal: Knowledge about tracheostomy care/management will improve Outcome: Progressing   Problem: Activity: Goal: Ability to tolerate increased activity will improve Outcome: Progressing   Problem: Health Behavior/Discharge Planning: Goal: Ability to manage tracheostomy will improve Outcome: Progressing   Problem: Education: Goal: Knowledge of General Education information will improve Description Including pain rating scale, medication(s)/side effects and non-pharmacologic comfort measures Outcome: Progressing

## 2018-12-07 NOTE — Progress Notes (Signed)
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 Denies any chest pain fever chills nausea vomiting abdominal pain or diarrhea. In addition patient has noticed increasing swelling of the lower extremities bilaterally over the last few days.  In the ER patient was afebrile chest x-ray was not showing anything acute. Had some bloody discharge from the trach area. On-call pulmonary critical care was consulted and patient placed on nebulizer treatment IV steroids for COPD exacerbation. Patient's right upper extremity PICC line area looks slightly red and erythematous for which PICC line was removed and placed on empiric antibiotics after blood cultures obtained. Per report patient had recent Candida UTI and also had Pseudomonas pneumonia. Patient also was given Lasix 20 mg IV in the ER for lower extremity edema. Patient's labs show creatinine of 0.5 sodium 140 potassium 4.7 BNP 75.2 hemoglobin 10.3 platelets 460. EKG sinus tachycardia  Assessment & Plan:   Principal Problem:   Acute respiratory failure with hypoxia (HCC) Active Problems:   COPD exacerbation (HCC)   Essential hypertension  Acute on chronic respiratory failure with hypoxia COPD exacerbation Trach and vent dependence Patient presenting from [facility] with progressive shortness of breath.  Patient was admitted to the intensive care unit and placed on full vent support.  Respiratory viral panel and COVID-19 test were both negative.  Patient was started on scheduled nebulized treatments and completed a 5-day course of IV steroids and antibiotics with cefepime.  Blood cultures  1 out of 4 were positive for coagulase-negative Staphylococcus which was likely contaminant.  Patient did well and was transitioned off of vent support back to trach collar. --Continue trach collar, at 28% FiO2; although patient states cannot tolerate an uncuffed trach --vent prn --Unfortunately patient does not want to return back to [facility]; and SNF require uncuffed trach with maximum FiO2 of 28% and trach would also have to be 39 days old prior to dc per CM. --Patient states working with family to see if there are other options in the [place] area --Continue respiratory therapy with nebs and suctioning as needed --Speech therapy evaluation for Passy-Muir valve may use with SLP  only --Case management for coordination  Essential hypertension --Continue lisinopril 10 mg p.o. daily  Anemia Hemoglobin 8.9;  Stable  BPH: Finasteride 5 mg p.o. daily  HLD: Continue statin  Anxiety:  Continue alprazolam 0.5 mg p.o. TID prn and clonidine 0.1 mg qHS  GERD: Continue PPI   DVT prophylaxis: Lovenox Code Status: Full code Family Communication: none Disposition Plan: Continue inpatient, transfer to pulmonary progressive unit today, awaiting SNF vs LTAC placement; will likely need LTAC given he states cannot tolerate an uncuffed trach which is not acceptable for SNF level of care per case management.   Consultants:   PCCM -signed off 5/29  Procedures:  Transthoracic echocardiogram 11/29/2018: IMPRESSIONS 1. The left ventricle has normal systolic function with an ejection fraction of 60-65%. The cavity size was normal. Left ventricular diastolic Doppler parameters are consistent with impaired relaxation. No evidence of left ventricular regional wall  motion abnormalities. 2. The right ventricle has normal systolic function. The cavity was mildly enlarged. There is no increase in right  ventricular wall thickness. Right ventricular systolic pressure could not be assessed.  3. Trivial pericardial effusion is present. 4. The aortic root is normal in size and structure.  Vascular ultrasound lower extremities: Summary: Right: There is no evidence of deep vein thrombosis in the lower extremity. No cystic structure found in the popliteal fossa. Left: There is no evidence of deep vein thrombosis in the lower extremity. No cystic structure found in the popliteal fossa.  Antimicrobials:   Cefepime 5/26 - 5/29  Vancomycin 5/26 - 5/28  Pressure Injury 11/29/18 Stage I -  Intact skin with non-blanchable redness of a localized area usually over a bony prominence. 9cm x 7cm reddened area to sacrum (Active)  11/29/18 1054  Location: Sacrum  Location Orientation: Posterior  Staging: Stage I -  Intact skin with non-blanchable redness of a localized area usually over a bony prominence.  Wound Description (Comments): 9cm x 7cm reddened area to sacrum  Present on Admission: Yes      Nutrition Problem: Inadequate oral intake Etiology: (chronic trach and vent dependent)     Signs/Symptoms: NPO status    Interventions: Tube feeding, Refer to RD note for recommendations  Estimated body mass index is 20.58 kg/m as calculated from the following:   Height as of this encounter: '5\' 5"'$  (1.651 m).   Weight as of this encounter: 56.1 kg.   Subjective: Up in bed using suction no specific c/o no nausea vomiting  Objective: Vitals:   12/07/18 0807 12/07/18 1122 12/07/18 1211 12/07/18 1424  BP: (!) 112/92 (!) 112/92 106/67 106/67  Pulse: (!) 108 95 90 86  Resp: '18 17 18 '$ (!) 22  Temp: (!) 97 F (36.1 C)  (!) 97.3 F (36.3 C)   TempSrc: Oral  Oral   SpO2: 96% 96%  96%  Weight:      Height:        Intake/Output Summary (Last 24 hours) at 12/07/2018 1438 Last data filed at 12/07/2018 0803 Gross per 24 hour  Intake 1400 ml  Output 1450 ml  Net -50 ml   Filed Weights   12/02/18 0500 12/06/18 0338 12/07/18 0620  Weight: 56.9 kg 55.8 kg 56.1 kg     Examination:  General exam: Appears calm and comfortable  Respiratory system: Coarse b/s  to auscultation. Respiratory effort normal. Cardiovascular system: S1 & S2 heard, RRR. No JVD, murmurs, rubs, gallops or clicks. No pedal edema. Gastrointestinal system: Abdomen is nondistended, soft and nontender. No organomegaly or masses felt. Normal bowel sounds heard. Central nervous system: Alert and oriented. No focal neurological deficits. Extremities: Symmetric 5 x 5 power. Skin: No rashes, lesions or ulcers Psychiatry: Judgement and insight appear normal. Mood & affect appropriate.     Data Reviewed: I have personally reviewed following labs and imaging studies  CBC: Recent Labs  Lab 12/02/18 0152 12/03/18 0752 12/04/18 0238 12/05/18 0359 12/07/18 0419  WBC 9.7 12.4* 9.1 10.7* 9.3  HGB 8.4* 9.1* 8.7* 8.9* 9.9*  HCT 28.2* 31.1* 28.9* 29.4* 33.4*  MCV 90.1 89.6 87.3 87.2 88.8  PLT 330 382 323 360 676   Basic Metabolic Panel: Recent Labs  Lab 12/01/18 0317 12/02/18 0152 12/03/18 0304 12/04/18 0238 12/05/18 0359 12/07/18 0419  NA 140 141 141 141 140 137  K 4.4 4.2 3.9 3.8 4.1 3.9  CL 105 103 101 99 103 100  CO2 '27 28 30 '$ 32 29 30  GLUCOSE 135* 125* 93 130* 142* 114*  BUN 26* '23 21 23 21 18  '$ CREATININE 0.53*  0.45* 0.50* 0.47* 0.51* 0.53*  CALCIUM 9.2 9.3 9.3 9.3 9.0 9.4  MG 2.1 2.1 2.1 2.1  --  2.0  PHOS 3.3 3.1 3.3 3.6  --   --    GFR: Estimated Creatinine Clearance: 70.1 mL/min (A) (by C-G formula based on SCr of 0.53 mg/dL (L)). Liver Function Tests: Recent Labs  Lab 12/01/18 0317  AST 17  ALT 20  ALKPHOS 51  BILITOT 0.4  PROT 5.9*  ALBUMIN 2.9*   No results for input(s): LIPASE, AMYLASE in the last 168 hours. No results for input(s): AMMONIA in the last 168 hours. Coagulation Profile: No results for input(s): INR, PROTIME in the last 168 hours. Cardiac Enzymes: No results for input(s): CKTOTAL, CKMB, CKMBINDEX, TROPONINI in the last 168 hours. BNP  (last 3 results) No results for input(s): PROBNP in the last 8760 hours. HbA1C: No results for input(s): HGBA1C in the last 72 hours. CBG: Recent Labs  Lab 12/06/18 2002 12/07/18 0004 12/07/18 0321 12/07/18 0805 12/07/18 1210  GLUCAP 121* 103* 84 94 100*   Lipid Profile: No results for input(s): CHOL, HDL, LDLCALC, TRIG, CHOLHDL, LDLDIRECT in the last 72 hours. Thyroid Function Tests: No results for input(s): TSH, T4TOTAL, FREET4, T3FREE, THYROIDAB in the last 72 hours. Anemia Panel: No results for input(s): VITAMINB12, FOLATE, FERRITIN, TIBC, IRON, RETICCTPCT in the last 72 hours. Sepsis Labs: No results for input(s): PROCALCITON, LATICACIDVEN in the last 168 hours.  Recent Results (from the past 240 hour(s))  Blood Culture (routine x 2)     Status: None   Collection Time: 11/28/18 11:50 PM  Result Value Ref Range Status   Specimen Description SITE NOT SPECIFIED  Final   Special Requests   Final    BOTTLES DRAWN AEROBIC AND ANAEROBIC Blood Culture adequate volume   Culture   Final    NO GROWTH 5 DAYS Performed at [facility], [address], [place], [place] [address]    Report Status 12/04/2018 FINAL  Final  SARS Coronavirus 2 (CEPHEID - Performed in [facility] lab), Hosp Order     Status: None   Collection Time: 11/28/18 11:57 PM  Result Value Ref Range Status   SARS Coronavirus 2 NEGATIVE NEGATIVE Final    Comment: (NOTE) If result is NEGATIVE SARS-CoV-2 target nucleic acids are NOT DETECTED. The SARS-CoV-2 RNA is generally detectable in upper and lower  respiratory specimens during the acute phase of infection. The lowest  concentration of SARS-CoV-2 viral copies this assay can detect is 250  copies / mL. A negative result does not preclude SARS-CoV-2 infection  and should not be used as the sole basis for treatment or other  patient management decisions.  A negative result may occur with  improper specimen collection / handling, submission of  specimen other  than nasopharyngeal swab, presence of viral mutation(s) within the  areas targeted by this assay, and inadequate number of viral copies  (<250 copies / mL). A negative result must be combined with clinical  observations, patient history, and epidemiological information. If result is POSITIVE SARS-CoV-2 target nucleic acids are DETECTED. The SARS-CoV-2 RNA is generally detectable in upper and lower  respiratory specimens dur ing the acute phase of infection.  Positive  results are indicative of active infection with SARS-CoV-2.  Clinical  correlation with patient history and other diagnostic information is  necessary to determine patient infection status.  Positive results do  not rule out bacterial infection or co-infection with other viruses. If result is  PRESUMPTIVE POSTIVE SARS-CoV-2 nucleic acids MAY BE PRESENT.   A presumptive positive result was obtained on the submitted specimen  and confirmed on repeat testing.  While 2019 novel coronavirus  (SARS-CoV-2) nucleic acids may be present in the submitted sample  additional confirmatory testing may be necessary for epidemiological  and / or clinical management purposes  to differentiate between  SARS-CoV-2 and other Sarbecovirus currently known to infect humans.  If clinically indicated additional testing with an alternate test  methodology [phone]) is advised. The SARS-CoV-2 RNA is generally  detectable in upper and lower respiratory sp ecimens during the acute  phase of infection. The expected result is Negative. Fact Sheet for Patients:  StrictlyIdeas.no Fact Sheet for Healthcare Providers: BankingDealers.co.za This test is not yet approved or cleared by the [place] FDA and has been authorized for detection and/or diagnosis of SARS-CoV-2 by FDA under an Emergency Use Authorization (EUA).  This EUA will remain in effect (meaning this test can be used) for the  duration of the COVID-19 declaration under Section 564(b)(1) of the Act, 21 U.S.C. section 360bbb-3(b)(1), unless the authorization is terminated or revoked sooner. Performed at [facility], [place] [address] [place], [place] [address]   Blood Culture (routine x 2)     Status: Abnormal   Collection Time: 11/29/18  2:00 AM  Result Value Ref Range Status   Specimen Description BLOOD RIGHT HAND  Final   Special Requests   Final    BOTTLES DRAWN AEROBIC AND ANAEROBIC Blood Culture results may not be optimal due to an inadequate volume of blood received in culture bottles   Culture  Setup Time   Final    GRAM POSITIVE COCCI IN CLUSTERS ANAEROBIC BOTTLE ONLY CRITICAL RESULT CALLED TO, READ BACK BY AND VERIFIED WITH: Cristopher Estimable PharmD 15:30 11/30/18 ([email or web address])    Culture (A)  Final    STAPHYLOCOCCUS SPECIES (COAGULASE NEGATIVE) THE SIGNIFICANCE OF ISOLATING THIS ORGANISM FROM A SINGLE SET OF BLOOD CULTURES WHEN MULTIPLE SETS ARE DRAWN IS UNCERTAIN. PLEASE NOTIFY THE MICROBIOLOGY DEPARTMENT WITHIN ONE WEEK IF SPECIATION AND SENSITIVITIES ARE REQUIRED. Performed at [facility], [place] [address] [place], [place] [address]    Report Status 12/02/2018 FINAL  Final  Blood Culture ID Panel (Reflexed)     Status: Abnormal   Collection Time: 11/29/18  2:00 AM  Result Value Ref Range Status   Enterococcus species NOT DETECTED NOT DETECTED Final   Listeria monocytogenes NOT DETECTED NOT DETECTED Final   Staphylococcus species DETECTED (A) NOT DETECTED Final    Comment: Methicillin (oxacillin) resistant coagulase negative staphylococcus. Possible blood culture contaminant (unless isolated from more than one blood culture draw or clinical case suggests pathogenicity). No antibiotic treatment is indicated for blood  culture contaminants. CRITICAL RESULT CALLED TO, READ BACK BY AND VERIFIED WITH: Cristopher Estimable PharmD 15:30 11/30/18 ([email or web address])    Staphylococcus aureus (BCID) NOT DETECTED NOT  DETECTED Final   Methicillin resistance DETECTED (A) NOT DETECTED Final    Comment: CRITICAL RESULT CALLED TO, READ BACK BY AND VERIFIED WITH: Cristopher Estimable PharmD 15:30 11/30/18 ([email or web address])    Streptococcus species NOT DETECTED NOT DETECTED Final   Streptococcus agalactiae NOT DETECTED NOT DETECTED Final   Streptococcus pneumoniae NOT DETECTED NOT DETECTED Final   Streptococcus pyogenes NOT DETECTED NOT DETECTED Final   Acinetobacter baumannii NOT DETECTED NOT DETECTED Final   Enterobacteriaceae species NOT DETECTED NOT DETECTED Final   Enterobacter cloacae complex NOT DETECTED NOT DETECTED Final   Escherichia coli  NOT DETECTED NOT DETECTED Final   Klebsiella oxytoca NOT DETECTED NOT DETECTED Final   Klebsiella pneumoniae NOT DETECTED NOT DETECTED Final   Proteus species NOT DETECTED NOT DETECTED Final   Serratia marcescens NOT DETECTED NOT DETECTED Final   Haemophilus influenzae NOT DETECTED NOT DETECTED Final   Neisseria meningitidis NOT DETECTED NOT DETECTED Final   Pseudomonas aeruginosa NOT DETECTED NOT DETECTED Final   Candida albicans NOT DETECTED NOT DETECTED Final   Candida glabrata NOT DETECTED NOT DETECTED Final   Candida krusei NOT DETECTED NOT DETECTED Final   Candida parapsilosis NOT DETECTED NOT DETECTED Final   Candida tropicalis NOT DETECTED NOT DETECTED Final    Comment: Performed at [facility], [place] [address], [place], [place] [address]  MRSA PCR Screening     Status: None   Collection Time: 11/29/18 10:58 AM  Result Value Ref Range Status   MRSA by PCR NEGATIVE NEGATIVE Final    Comment:        The GeneXpert MRSA Assay (FDA approved for NASAL specimens only), is one component of a comprehensive MRSA colonization surveillance program. It is not intended to diagnose MRSA infection nor to guide or monitor treatment for MRSA infections. Performed at [facility], [place] [address], [place], [place] [address]   Respiratory Panel by PCR     Status:  None   Collection Time: 11/29/18  4:02 PM  Result Value Ref Range Status   Adenovirus NOT DETECTED NOT DETECTED Final   Coronavirus 229E NOT DETECTED NOT DETECTED Final    Comment: (NOTE) The Coronavirus on the Respiratory Panel, DOES NOT test for the novel  Coronavirus (2019 nCoV)    Coronavirus HKU1 NOT DETECTED NOT DETECTED Final   Coronavirus NL63 NOT DETECTED NOT DETECTED Final   Coronavirus OC43 NOT DETECTED NOT DETECTED Final   Metapneumovirus NOT DETECTED NOT DETECTED Final   Rhinovirus / Enterovirus NOT DETECTED NOT DETECTED Final   Influenza A NOT DETECTED NOT DETECTED Final   Influenza B NOT DETECTED NOT DETECTED Final   Parainfluenza Virus 1 NOT DETECTED NOT DETECTED Final   Parainfluenza Virus 2 NOT DETECTED NOT DETECTED Final   Parainfluenza Virus 3 NOT DETECTED NOT DETECTED Final   Parainfluenza Virus 4 NOT DETECTED NOT DETECTED Final   Respiratory Syncytial Virus NOT DETECTED NOT DETECTED Final   Bordetella pertussis NOT DETECTED NOT DETECTED Final   Chlamydophila pneumoniae NOT DETECTED NOT DETECTED Final   Mycoplasma pneumoniae NOT DETECTED NOT DETECTED Final    Comment: Performed at [facility], [place]. [address] [place], [place] [address]         Radiology Studies: No results found.      Scheduled Meds: . atorvastatin  20 mg Oral q1800  . chlorhexidine gluconate (MEDLINE KIT)  15 mL Mouth Rinse BID  . Chlorhexidine Gluconate Cloth  6 each Topical Daily  . enoxaparin (LOVENOX) injection  40 mg Subcutaneous Q24H  . feeding supplement (PRO-STAT SUGAR FREE 64)  30 mL Per Tube Daily  . finasteride  5 mg Oral Daily  . ipratropium-albuterol  3 mL Nebulization BID  . lisinopril  10 mg Per Tube Daily  . mouth rinse  15 mL Mouth Rinse 10 times per day  . pantoprazole sodium  40 mg Per Tube Daily   Continuous Infusions: . feeding supplement (JEVITY 1.2 CAL) 1,000 mL (12/07/18 0200)     LOS: 8 days     Georgette Shell, MD [facility]  If 7PM-7AM, please contact night-coverage [email or web address] Password [email or web address] 12/07/2018, 2:38 PM

## 2018-12-07 NOTE — Care Management Important Message (Signed)
Important Message  Patient Details  Name: Robert Pham MRN: [id number] Date of Birth: 07-Mar-1951   Medicare Important Message Given:  Yes    Zack Crager 12/07/2018, 2:10 PM

## 2018-12-08 ENCOUNTER — Inpatient Hospital Stay (HOSPITAL_COMMUNITY): Payer: Medicare Other

## 2018-12-08 LAB — GLUCOSE, CAPILLARY
Glucose-Capillary: 109 mg/dL — ABNORMAL HIGH (ref 70–99)
Glucose-Capillary: 117 mg/dL — ABNORMAL HIGH (ref 70–99)
Glucose-Capillary: 77 mg/dL (ref 70–99)
Glucose-Capillary: 90 mg/dL (ref 70–99)
Glucose-Capillary: 92 mg/dL (ref 70–99)
Glucose-Capillary: 96 mg/dL (ref 70–99)

## 2018-12-08 NOTE — Progress Notes (Signed)
OT Cancellation Note  Patient Details Name: Robert Pham MRN: [id number] DOB: 01-25-1951   Cancelled Treatment:    Reason Eval/Treat Not Completed: Other (comment);Fatigue/lethargy limiting ability to participate; pt sleeping soundly upon entering room, able to arouse but pt only maintaining eyes open for a few seconds and then falling back asleep, multiple attempts. Spoke with RN and pt reports pt recently received pain and anxiety meds. Will follow up for OT treatment as schedule permits.  Marcy Siren, OT [facility] Pager [phone] Office [phone]   Orlando Penner 12/08/2018, 11:59 AM

## 2018-12-08 NOTE — Progress Notes (Signed)
PROGRESS NOTE    Robert Pham  CEY:[id number] DOB: [birth year] DOA: 11/28/2018 PCP: Townsend Roger, MD  Brief Narrative: 68 y.o.malewithhistory of COPD with trach PEG and vent dependent presently on transplant list at [facility], hypertension presents to the ER with complaints of increasing shortness of breath over the last 3 days and has been having increasing bloody discharge over the last 1 to 2 weeks. Denies any chest pain fever chills nausea vomiting abdominal pain or diarrhea. In addition patient has noticed increasing swelling of the lower extremities bilaterally over the last few days.  In the ER patient was afebrile chest x-ray was not showing anything acute. Had some bloody discharge from the trach area. On-call pulmonary critical care was consulted and patient placed on nebulizer treatment IV steroids for COPD exacerbation. Patient's right upper extremity PICC line area looks slightly red and erythematous for which PICC line was removed and placed on empiric antibiotics after blood cultures obtained. Per report patient had recent Candida UTI and also had Pseudomonas pneumonia. Patient also was given Lasix 20 mg IV in the ER for lower extremity edema. Patient's labs show creatinine of 0.5 sodium 140 potassium 4.7 BNP 75.2 hemoglobin 10.3 platelets 460. EKG sinus tachycardia   Assessment & Plan:   Principal Problem:   Acute respiratory failure with hypoxia (HCC) Active Problems:   COPD exacerbation (HCC)   Essential hypertension   Pressure injury of skin  Acute on chronic respiratory failure with hypoxia COPD exacerbation Trach and vent dependence Patient presenting from [facility] with progressive shortness of breath. Patient was admitted to the intensive care unit and placed on full vent support. Respiratory viral panel and COVID-19 test were both negative. Patient was started on scheduled nebulized treatments and completed a 5-day course of IV steroids and antibiotics  with cefepime. Blood cultures 1 out of 4 were positive for coagulase-negative Staphylococcus which was likely contaminant. Patient did well and was transitioned off of vent support back to trach collar. --Continue trach collar 100% still on  28% FiO2;--vent prn --Unfortunately patient does not want to return back to [facility]; and SNF require uncuffed trach with maximum FiO2 of 28% and trach would also have to be 55 days old prior to dc per CM. --Patient states working with family to see if there are other options in the [place] area --Continue respiratory therapy with nebs and suctioning as needed --Speech therapy evaluation for Passy-Muir valve may use with SLP  only -Chest x-ray 12/08/2018 no new infiltrates  Essential hypertension blood pressure running soft to DC lisinopril.  Anemia Hemoglobin 9.9; Stable  SLP:NPYYFRTMYTR 5 mg p.o. daily  ZNB:VAPOLIDC statin  Anxiety: Continue alprazolam 0.5 mg p.o. TID prn and clonidine 0.1 mg qHS  GERD:Continue PPI   DVT prophylaxis:Lovenox Code Status:Full code Family Communication:none Disposition Plan: Await LTAC placement versus SNF  Consultants:  PCCM -signed off 5/29  Procedures: Transthoracic echocardiogram 11/29/2018: IMPRESSIONS 1. The left ventricle has normal systolic function with an ejection fraction of 60-65%. The cavity size was normal. Left ventricular diastolic Doppler parameters are consistent with impaired relaxation. No evidence of left ventricular regional wall  motion abnormalities. 2. The right ventricle has normal systolic function. The cavity was mildly enlarged. There is no increase in right ventricular wall thickness. Right ventricular systolic pressure could not be assessed. 3. Trivial pericardial effusion is present. 4. The aortic root is normal in size and structure.  Vascular ultrasound lower extremities: Summary: Right: There is no evidence of deep vein thrombosis in the  lower extremity. No cystic structure found in the popliteal fossa. Left: There is no evidence of deep vein thrombosis in the lower extremity. No cystic structure found in the popliteal fossa.  Antimicrobials:   Cefepime 5/26 - 5/29  Vancomycin 5/26 - 5/28 Pressure Injury 11/29/18 Stage I -  Intact skin with non-blanchable redness of a localized area usually over a bony prominence. 9cm x 7cm reddened area to sacrum (Active)  11/29/18 1054  Location: Sacrum  Location Orientation: Posterior  Staging: Stage I -  Intact skin with non-blanchable redness of a localized area usually over a bony prominence.  Wound Description (Comments): 9cm x 7cm reddened area to sacrum  Present on Admission: Yes      Nutrition Problem: Inadequate oral intake Etiology: (chronic trach and vent dependent)     Signs/Symptoms: NPO status    Interventions: Tube feeding, Refer to RD note for recommendations  Estimated body mass index is 19.3 kg/m as calculated from the following:   Height as of this encounter: '5\' 5"'$  (1.651 m).   Weight as of this encounter: 52.6 kg.    Subjective:  Staff reports he was very anxious overnight Objective: Vitals:   12/08/18 1055 12/08/18 1057 12/08/18 1115 12/08/18 1117  BP:    104/74  Pulse: 88 95  75  Resp: (!) 23 (!) 23 (!) 23 (!) 22  Temp:    97.8 F (36.6 C)  TempSrc:    Oral  SpO2: 96% 96%  100%  Weight:      Height:        Intake/Output Summary (Last 24 hours) at 12/08/2018 1159 Last data filed at 12/08/2018 0420 Gross per 24 hour  Intake 865 ml  Output 250 ml  Net 615 ml   Filed Weights   12/06/18 0338 12/07/18 0620 12/08/18 0418  Weight: 55.8 kg 56.1 kg 52.6 kg    Examination:  General exam: Appears calm and comfortable  Respiratory system:  Scattered rhonchi to auscultation. Respiratory effort normal. Cardiovascular system: S1 & S2 heard, RRR. No JVD, murmurs, rubs, gallops or clicks. No pedal edema. Gastrointestinal system: Abdomen is  nondistended, soft and nontender. No organomegaly or masses felt. Normal bowel sounds heard. Central nervous system: Alert and oriented. No focal neurological deficits. Extremities: Symmetric 5 x 5 power. Skin: No rashes, lesions or ulcers Psychiatry: Judgement and insight appear normal. Mood & affect appropriate.     Data Reviewed: I have personally reviewed following labs and imaging studies  CBC: Recent Labs  Lab 12/02/18 0152 12/03/18 0752 12/04/18 0238 12/05/18 0359 12/07/18 0419  WBC 9.7 12.4* 9.1 10.7* 9.3  HGB 8.4* 9.1* 8.7* 8.9* 9.9*  HCT 28.2* 31.1* 28.9* 29.4* 33.4*  MCV 90.1 89.6 87.3 87.2 88.8  PLT 330 382 323 360 128   Basic Metabolic Panel: Recent Labs  Lab 12/02/18 0152 12/03/18 0304 12/04/18 0238 12/05/18 0359 12/07/18 0419  NA 141 141 141 140 137  K 4.2 3.9 3.8 4.1 3.9  CL 103 101 99 103 100  CO2 28 30 32 29 30  GLUCOSE 125* 93 130* 142* 114*  BUN '23 21 23 21 18  '$ CREATININE 0.45* 0.50* 0.47* 0.51* 0.53*  CALCIUM 9.3 9.3 9.3 9.0 9.4  MG 2.1 2.1 2.1  --  2.0  PHOS 3.1 3.3 3.6  --   --    GFR: Estimated Creatinine Clearance: 65.8 mL/min (A) (by C-G formula based on SCr of 0.53 mg/dL (L)). Liver Function Tests: No results for input(s): AST, ALT, ALKPHOS, BILITOT, PROT,  ALBUMIN in the last 168 hours. No results for input(s): LIPASE, AMYLASE in the last 168 hours. No results for input(s): AMMONIA in the last 168 hours. Coagulation Profile: No results for input(s): INR, PROTIME in the last 168 hours. Cardiac Enzymes: No results for input(s): CKTOTAL, CKMB, CKMBINDEX, TROPONINI in the last 168 hours. BNP (last 3 results) No results for input(s): PROBNP in the last 8760 hours. HbA1C: No results for input(s): HGBA1C in the last 72 hours. CBG: Recent Labs  Lab 12/07/18 1934 12/07/18 2347 12/08/18 0416 12/08/18 0717 12/08/18 1116  GLUCAP 113* 120* 117* 92 109*   Lipid Profile: No results for input(s): CHOL, HDL, LDLCALC, TRIG, CHOLHDL,  LDLDIRECT in the last 72 hours. Thyroid Function Tests: No results for input(s): TSH, T4TOTAL, FREET4, T3FREE, THYROIDAB in the last 72 hours. Anemia Panel: No results for input(s): VITAMINB12, FOLATE, FERRITIN, TIBC, IRON, RETICCTPCT in the last 72 hours. Sepsis Labs: No results for input(s): PROCALCITON, LATICACIDVEN in the last 168 hours.  Recent Results (from the past 240 hour(s))  Blood Culture (routine x 2)     Status: None   Collection Time: 11/28/18 11:50 PM  Result Value Ref Range Status   Specimen Description SITE NOT SPECIFIED  Final   Special Requests   Final    BOTTLES DRAWN AEROBIC AND ANAEROBIC Blood Culture adequate volume   Culture   Final    NO GROWTH 5 DAYS Performed at [facility], [address] [place], [place] [address]    Report Status 12/04/2018 FINAL  Final  SARS Coronavirus 2 (CEPHEID - Performed in [place] hospital lab), Hosp Order     Status: None   Collection Time: 11/28/18 11:57 PM  Result Value Ref Range Status   SARS Coronavirus 2 NEGATIVE NEGATIVE Final    Comment: (NOTE) If result is NEGATIVE SARS-CoV-2 target nucleic acids are NOT DETECTED. The SARS-CoV-2 RNA is generally detectable in upper and lower  respiratory specimens during the acute phase of infection. The lowest  concentration of SARS-CoV-2 viral copies this assay can detect is 250  copies / mL. A negative result does not preclude SARS-CoV-2 infection  and should not be used as the sole basis for treatment or other  patient management decisions.  A negative result may occur with  improper specimen collection / handling, submission of specimen other  than nasopharyngeal swab, presence of viral mutation(s) within the  areas targeted by this assay, and inadequate number of viral copies  (<250 copies / mL). A negative result must be combined with clinical  observations, patient history, and epidemiological information. If result is POSITIVE SARS-CoV-2 target nucleic acids are  DETECTED. The SARS-CoV-2 RNA is generally detectable in upper and lower  respiratory specimens dur ing the acute phase of infection.  Positive  results are indicative of active infection with SARS-CoV-2.  Clinical  correlation with patient history and other diagnostic information is  necessary to determine patient infection status.  Positive results do  not rule out bacterial infection or co-infection with other viruses. If result is PRESUMPTIVE POSTIVE SARS-CoV-2 nucleic acids MAY BE PRESENT.   A presumptive positive result was obtained on the submitted specimen  and confirmed on repeat testing.  While 2019 novel coronavirus  (SARS-CoV-2) nucleic acids may be present in the submitted sample  additional confirmatory testing may be necessary for epidemiological  and / or clinical management purposes  to differentiate between  SARS-CoV-2 and other Sarbecovirus currently known to infect humans.  If clinically indicated additional  testing with an alternate test  methodology [phone]) is advised. The SARS-CoV-2 RNA is generally  detectable in upper and lower respiratory sp ecimens during the acute  phase of infection. The expected result is Negative. Fact Sheet for Patients:  StrictlyIdeas.no Fact Sheet for Healthcare Providers: BankingDealers.co.za This test is not yet approved or cleared by the [place] FDA and has been authorized for detection and/or diagnosis of SARS-CoV-2 by FDA under an Emergency Use Authorization (EUA).  This EUA will remain in effect (meaning this test can be used) for the duration of the COVID-19 declaration under Section 564(b)(1) of the Act, 21 U.S.C. section 360bbb-3(b)(1), unless the authorization is terminated or revoked sooner. Performed at [facility], [place] [address], [place], [place] [address]   Blood Culture (routine x 2)     Status: Abnormal   Collection Time: 11/29/18  2:00 AM  Result Value  Ref Range Status   Specimen Description BLOOD RIGHT HAND  Final   Special Requests   Final    BOTTLES DRAWN AEROBIC AND ANAEROBIC Blood Culture results may not be optimal due to an inadequate volume of blood received in culture bottles   Culture  Setup Time   Final    GRAM POSITIVE COCCI IN CLUSTERS ANAEROBIC BOTTLE ONLY CRITICAL RESULT CALLED TO, READ BACK BY AND VERIFIED WITH: Cristopher Estimable PharmD 15:30 11/30/18 ([email or web address])    Culture (A)  Final    STAPHYLOCOCCUS SPECIES (COAGULASE NEGATIVE) THE SIGNIFICANCE OF ISOLATING THIS ORGANISM FROM A SINGLE SET OF BLOOD CULTURES WHEN MULTIPLE SETS ARE DRAWN IS UNCERTAIN. PLEASE NOTIFY THE MICROBIOLOGY DEPARTMENT WITHIN ONE WEEK IF SPECIATION AND SENSITIVITIES ARE REQUIRED. Performed at [facility], [place] [address] [place], [place] [address]    Report Status 12/02/2018 FINAL  Final  Blood Culture ID Panel (Reflexed)     Status: Abnormal   Collection Time: 11/29/18  2:00 AM  Result Value Ref Range Status   Enterococcus species NOT DETECTED NOT DETECTED Final   Listeria monocytogenes NOT DETECTED NOT DETECTED Final   Staphylococcus species DETECTED (A) NOT DETECTED Final    Comment: Methicillin (oxacillin) resistant coagulase negative staphylococcus. Possible blood culture contaminant (unless isolated from more than one blood culture draw or clinical case suggests pathogenicity). No antibiotic treatment is indicated for blood  culture contaminants. CRITICAL RESULT CALLED TO, READ BACK BY AND VERIFIED WITH: Cristopher Estimable PharmD 15:30 11/30/18 ([email or web address])    Staphylococcus aureus (BCID) NOT DETECTED NOT DETECTED Final   Methicillin resistance DETECTED (A) NOT DETECTED Final    Comment: CRITICAL RESULT CALLED TO, READ BACK BY AND VERIFIED WITH: Cristopher Estimable PharmD 15:30 11/30/18 ([email or web address])    Streptococcus species NOT DETECTED NOT DETECTED Final   Streptococcus agalactiae NOT DETECTED NOT DETECTED Final   Streptococcus pneumoniae NOT DETECTED NOT  DETECTED Final   Streptococcus pyogenes NOT DETECTED NOT DETECTED Final   Acinetobacter baumannii NOT DETECTED NOT DETECTED Final   Enterobacteriaceae species NOT DETECTED NOT DETECTED Final   Enterobacter cloacae complex NOT DETECTED NOT DETECTED Final   Escherichia coli NOT DETECTED NOT DETECTED Final   Klebsiella oxytoca NOT DETECTED NOT DETECTED Final   Klebsiella pneumoniae NOT DETECTED NOT DETECTED Final   Proteus species NOT DETECTED NOT DETECTED Final   Serratia marcescens NOT DETECTED NOT DETECTED Final   Haemophilus influenzae NOT DETECTED NOT DETECTED Final   Neisseria meningitidis NOT DETECTED NOT DETECTED Final   Pseudomonas aeruginosa NOT DETECTED NOT DETECTED Final   Candida albicans NOT DETECTED NOT DETECTED Final  Candida glabrata NOT DETECTED NOT DETECTED Final   Candida krusei NOT DETECTED NOT DETECTED Final   Candida parapsilosis NOT DETECTED NOT DETECTED Final   Candida tropicalis NOT DETECTED NOT DETECTED Final    Comment: Performed at [facility], [place] [address] [place], [place] [address]  MRSA PCR Screening     Status: None   Collection Time: 11/29/18 10:58 AM  Result Value Ref Range Status   MRSA by PCR NEGATIVE NEGATIVE Final    Comment:        The GeneXpert MRSA Assay (FDA approved for NASAL specimens only), is one component of a comprehensive MRSA colonization surveillance program. It is not intended to diagnose MRSA infection nor to guide or monitor treatment for MRSA infections. Performed at [facility], [place] [address], [place], [place] [address]   Respiratory Panel by PCR     Status: None   Collection Time: 11/29/18  4:02 PM  Result Value Ref Range Status   Adenovirus NOT DETECTED NOT DETECTED Final   Coronavirus 229E NOT DETECTED NOT DETECTED Final    Comment: (NOTE) The Coronavirus on the Respiratory Panel, DOES NOT test for the novel  Coronavirus (2019 nCoV)    Coronavirus HKU1 NOT DETECTED NOT DETECTED Final   Coronavirus  NL63 NOT DETECTED NOT DETECTED Final   Coronavirus OC43 NOT DETECTED NOT DETECTED Final   Metapneumovirus NOT DETECTED NOT DETECTED Final   Rhinovirus / Enterovirus NOT DETECTED NOT DETECTED Final   Influenza A NOT DETECTED NOT DETECTED Final   Influenza B NOT DETECTED NOT DETECTED Final   Parainfluenza Virus 1 NOT DETECTED NOT DETECTED Final   Parainfluenza Virus 2 NOT DETECTED NOT DETECTED Final   Parainfluenza Virus 3 NOT DETECTED NOT DETECTED Final   Parainfluenza Virus 4 NOT DETECTED NOT DETECTED Final   Respiratory Syncytial Virus NOT DETECTED NOT DETECTED Final   Bordetella pertussis NOT DETECTED NOT DETECTED Final   Chlamydophila pneumoniae NOT DETECTED NOT DETECTED Final   Mycoplasma pneumoniae NOT DETECTED NOT DETECTED Final    Comment: Performed at [facility], [place]. [address] [place], [place] [address]         Radiology Studies: Dg Chest Port 1 View  Result Date: 12/08/2018 CLINICAL DATA:  History of COPD.  Tracheostomy. EXAM: PORTABLE CHEST 1 VIEW COMPARISON:  12/03/2018. FINDINGS: Tracheostomy tube in stable position. Gastrostomy tube noted over the stomach. Improved aeration in the lung bases. Stable nodular opacity over the left lung base again most consistent with nipple shadow. This could be compartment PA lateral chest x-ray. COPD. Heart size stable. IMPRESSION: 1. Tracheostomy tube in stable position. Gastrostomy tube noted over the stomach. 2. Improved aeration both lung bases. Persistent nodular density noted over the left lung base again consistent with nipple shadow. This could be confirmed with PA and lateral chest x-ray. COPD. No new infiltrates. Electronically Signed   By: Hamilton   On: 12/08/2018 11:21        Scheduled Meds: . atorvastatin  20 mg Oral q1800  . chlorhexidine gluconate (MEDLINE KIT)  15 mL Mouth Rinse BID  . Chlorhexidine Gluconate Cloth  6 each Topical Daily  . enoxaparin (LOVENOX) injection  40 mg Subcutaneous Q24H  .  feeding supplement (PRO-STAT SUGAR FREE 64)  30 mL Per Tube Daily  . finasteride  5 mg Oral Daily  . ipratropium-albuterol  3 mL Nebulization BID  . lisinopril  10 mg Per Tube Daily  . mouth rinse  15 mL  Mouth Rinse 10 times per day  . pantoprazole sodium  40 mg Per Tube Daily   Continuous Infusions: . feeding supplement (JEVITY 1.2 CAL) 1,000 mL (12/07/18 0200)     LOS: 9 days     Georgette Shell, MD [facility]  If 7PM-7AM, please contact night-coverage [email or web address] Password [email or web address] 12/08/2018, 11:59 AM

## 2018-12-09 LAB — GLUCOSE, CAPILLARY
Glucose-Capillary: 114 mg/dL — ABNORMAL HIGH (ref 70–99)
Glucose-Capillary: 110 mg/dL — ABNORMAL HIGH (ref 70–99)
Glucose-Capillary: 108 mg/dL — ABNORMAL HIGH (ref 70–99)
Glucose-Capillary: 96 mg/dL (ref 70–99)

## 2018-12-09 MED ORDER — ALPRAZOLAM 0.5 MG PO TABS
1.0000 mg | ORAL_TABLET | Freq: Three times a day (TID) | ORAL | Status: DC
  Administered 2018-12-09 – 2018-12-10 (×5): 1 mg via ORAL
  Filled 2018-12-09 (×5): qty 2

## 2018-12-09 NOTE — Progress Notes (Signed)
PROGRESS NOTE    Robert Pham  WUX:[id number] DOB: [birth year] DOA: 11/28/2018 PCP: Townsend Roger, MD  Brief Narrative: 68 y.o.malewithhistory of COPD with trach PEG and vent dependent presently on transplant list at [facility], hypertension presents to the ER with complaints of increasing shortness of breath over the last 3 days and has been having increasing bloody discharge over the last 1 to 2 weeks. Denies any chest pain fever chills nausea vomiting abdominal pain or diarrhea. In addition patient has noticed increasing swelling of the lower extremities bilaterally over the last few days.  In the ER patient was afebrile chest x-ray was not showing anything acute. Had some bloody discharge from the trach area. On-call pulmonary critical care was consulted and patient placed on nebulizer treatment IV steroids for COPD exacerbation. Patient's right upper extremity PICC line area looks slightly red and erythematous for which PICC line was removed and placed on empiric antibiotics after blood cultures obtained. Per report patient had recent Candida UTI and also had Pseudomonas pneumonia. Patient also was given Lasix 20 mg IV in the ER for lower extremity edema. Patient's labs show creatinine of 0.5 sodium 140 potassium 4.7 BNP 75.2 hemoglobin 10.3 platelets 460. EKG sinus tachycardia Assessment & Plan:   Principal Problem:   Acute respiratory failure with hypoxia (HCC) Active Problems:   COPD exacerbation (HCC)   Essential hypertension   Pressure injury of skin   Acute on chronic respiratory failure with hypoxia COPD exacerbation Trach and vent dependence Patient presenting from [facility] with progressive shortness of breath. Patient was admitted to the intensive care unit and placed on full vent support. Respiratory viral panel and COVID-19 test were both negative. Patient was started on scheduled nebulized treatments and completed a 5-day course of IV steroids and antibiotics  with cefepime. Blood cultures 1 out of 4 were positive for coagulase-negative Staphylococcus which was likely contaminant. Patient did well and was transitioned off of vent support back to trach collar. --Continue trach collar 100% still on  28% FiO2;--vent prn --Unfortunately patient does not want to return back to [facility]; and SNF require uncuffed trach with maximum FiO2 of 28% and trach would also have to be 58 days old prior to dc per CM. --Patient states working with family to see if there are other options in the [place] area --Continue respiratory therapy with nebs and suctioning as needed --Speech therapy advanced diet to thin liquids and puree since he tolerated deflation and pmv -Chest x-ray 12/08/2018 no new infiltrates  Essential hypertension blood pressure running soft to DC lisinopril.  Anemia Hemoglobin 9.9; Stable  OZD:GUYQIHKVQQV 5 mg p.o. daily  ZDG:LOVFIEPP statin  Anxiety: change  alprazolam 0.5 mg p.o. TID  and clonidine 0.1 mg qHS  GERD:Continue PPI   DVT prophylaxis:Lovenox Code Status:Full code Family Communication:none Disposition Plan: Await LTAC placement versus SNF  Consultants:  PCCM -signed off 5/29  Procedures: Transthoracic echocardiogram 11/29/2018: IMPRESSIONS 1. The left ventricle has normal systolic function with an ejection fraction of 60-65%. The cavity size was normal. Left ventricular diastolic Doppler parameters are consistent with impaired relaxation. No evidence of left ventricular regional wall  motion abnormalities. 2. The right ventricle has normal systolic function. The cavity was mildly enlarged. There is no increase in right ventricular wall thickness. Right ventricular systolic pressure could not be assessed. 3. Trivial pericardial effusion is present. 4. The aortic root is normal in size and structure.  Vascular ultrasound lower extremities: Summary: Right: There is no evidence of deep vein  thrombosis in the lower extremity. No cystic structure found in the popliteal fossa. Left: There is no evidence of deep vein thrombosis in the lower extremity. No cystic structure found in the popliteal fossa.  Antimicrobials:   Cefepime 5/26 - 5/29  Vancomycin 5/26 - 5/28   Pressure Injury 11/29/18 Stage I -  Intact skin with non-blanchable redness of a localized area usually over a bony prominence. 9cm x 7cm reddened area to sacrum (Active)  11/29/18 1054  Location: Sacrum  Location Orientation: Posterior  Staging: Stage I -  Intact skin with non-blanchable redness of a localized area usually over a bony prominence.  Wound Description (Comments): 9cm x 7cm reddened area to sacrum  Present on Admission: Yes      Nutrition Problem: Inadequate oral intake Etiology: (chronic trach and vent dependent)     Signs/Symptoms: NPO status    Interventions: Tube feeding, Refer to RD note for recommendations  Estimated body mass index is 19.3 kg/m as calculated from the following:   Height as of this encounter: '5\' 5"'$  (1.651 m).   Weight as of this encounter: 52.6 kg.   Subjective: Anxious asking for xanax   Tolerated cuff deflation PMV placement by speech starting puree thin liquids Objective: Vitals:   12/09/18 0419 12/09/18 0755 12/09/18 0931 12/09/18 1117  BP: 107/79  128/90   Pulse: 91 63 92 (!) 106  Resp: 19 (!) '25 16 20  '$ Temp: 98 F (36.7 C)  98.7 F (37.1 C)   TempSrc: Oral  Oral   SpO2: 97% 96% 97% 95%  Weight:      Height:        Intake/Output Summary (Last 24 hours) at 12/09/2018 1223 Last data filed at 12/08/2018 1500 Gross per 24 hour  Intake 480 ml  Output 300 ml  Net 180 ml   Filed Weights   12/06/18 0338 12/07/18 0620 12/08/18 0418  Weight: 55.8 kg 56.1 kg 52.6 kg    Examination:  General exam: Appears anxious rapid breathing Respiratory system: Clear to auscultation. Respiratory effort normal. Cardiovascular system: S1 & S2 heard, RRR. No  JVD, murmurs, rubs, gallops or clicks. No pedal edema. Gastrointestinal system: Abdomen is nondistended, soft and nontender. No organomegaly or masses felt. Normal bowel sounds heard.peg in place Central nervous system: Alert and oriented. No focal neurological deficits. Extremities: Symmetric 5 x 5 power. Skin: No rashes, lesions or ulcers    Data Reviewed: I have personally reviewed following labs and imaging studies  CBC: Recent Labs  Lab 12/03/18 0752 12/04/18 0238 12/05/18 0359 12/07/18 0419  WBC 12.4* 9.1 10.7* 9.3  HGB 9.1* 8.7* 8.9* 9.9*  HCT 31.1* 28.9* 29.4* 33.4*  MCV 89.6 87.3 87.2 88.8  PLT 382 323 360 226   Basic Metabolic Panel: Recent Labs  Lab 12/03/18 0304 12/04/18 0238 12/05/18 0359 12/07/18 0419  NA 141 141 140 137  K 3.9 3.8 4.1 3.9  CL 101 99 103 100  CO2 30 32 29 30  GLUCOSE 93 130* 142* 114*  BUN '21 23 21 18  '$ CREATININE 0.50* 0.47* 0.51* 0.53*  CALCIUM 9.3 9.3 9.0 9.4  MG 2.1 2.1  --  2.0  PHOS 3.3 3.6  --   --    GFR: Estimated Creatinine Clearance: 65.8 mL/min (A) (by C-G formula based on SCr of 0.53 mg/dL (L)). Liver Function Tests: No results for input(s): AST, ALT, ALKPHOS, BILITOT, PROT, ALBUMIN in the last 168 hours. No results for input(s): LIPASE, AMYLASE in the last 168 hours.  No results for input(s): AMMONIA in the last 168 hours. Coagulation Profile: No results for input(s): INR, PROTIME in the last 168 hours. Cardiac Enzymes: No results for input(s): CKTOTAL, CKMB, CKMBINDEX, TROPONINI in the last 168 hours. BNP (last 3 results) No results for input(s): PROBNP in the last 8760 hours. HbA1C: No results for input(s): HGBA1C in the last 72 hours. CBG: Recent Labs  Lab 12/08/18 1622 12/08/18 1957 12/08/18 2349 12/09/18 0413 12/09/18 0725  GLUCAP 96 90 77 114* 110*   Lipid Profile: No results for input(s): CHOL, HDL, LDLCALC, TRIG, CHOLHDL, LDLDIRECT in the last 72 hours. Thyroid Function Tests: No results for  input(s): TSH, T4TOTAL, FREET4, T3FREE, THYROIDAB in the last 72 hours. Anemia Panel: No results for input(s): VITAMINB12, FOLATE, FERRITIN, TIBC, IRON, RETICCTPCT in the last 72 hours. Sepsis Labs: No results for input(s): PROCALCITON, LATICACIDVEN in the last 168 hours.  Recent Results (from the past 240 hour(s))  Respiratory Panel by PCR     Status: None   Collection Time: 11/29/18  4:02 PM  Result Value Ref Range Status   Adenovirus NOT DETECTED NOT DETECTED Final   Coronavirus 229E NOT DETECTED NOT DETECTED Final    Comment: (NOTE) The Coronavirus on the Respiratory Panel, DOES NOT test for the novel  Coronavirus (2019 nCoV)    Coronavirus HKU1 NOT DETECTED NOT DETECTED Final   Coronavirus NL63 NOT DETECTED NOT DETECTED Final   Coronavirus OC43 NOT DETECTED NOT DETECTED Final   Metapneumovirus NOT DETECTED NOT DETECTED Final   Rhinovirus / Enterovirus NOT DETECTED NOT DETECTED Final   Influenza A NOT DETECTED NOT DETECTED Final   Influenza B NOT DETECTED NOT DETECTED Final   Parainfluenza Virus 1 NOT DETECTED NOT DETECTED Final   Parainfluenza Virus 2 NOT DETECTED NOT DETECTED Final   Parainfluenza Virus 3 NOT DETECTED NOT DETECTED Final   Parainfluenza Virus 4 NOT DETECTED NOT DETECTED Final   Respiratory Syncytial Virus NOT DETECTED NOT DETECTED Final   Bordetella pertussis NOT DETECTED NOT DETECTED Final   Chlamydophila pneumoniae NOT DETECTED NOT DETECTED Final   Mycoplasma pneumoniae NOT DETECTED NOT DETECTED Final    Comment: Performed at [facility], [address] [place], [place] [address]         Radiology Studies: Dg Chest Port 1 View  Result Date: 12/08/2018 CLINICAL DATA:  History of COPD.  Tracheostomy. EXAM: PORTABLE CHEST 1 VIEW COMPARISON:  12/03/2018. FINDINGS: Tracheostomy tube in stable position. Gastrostomy tube noted over the stomach. Improved aeration in the lung bases. Stable nodular opacity over the left lung base again most consistent  with nipple shadow. This could be compartment PA lateral chest x-ray. COPD. Heart size stable. IMPRESSION: 1. Tracheostomy tube in stable position. Gastrostomy tube noted over the stomach. 2. Improved aeration both lung bases. Persistent nodular density noted over the left lung base again consistent with nipple shadow. This could be confirmed with PA and lateral chest x-ray. COPD. No new infiltrates. Electronically Signed   By: Marcello Moores  Register   On: 12/08/2018 11:21        Scheduled Meds: . ALPRAZolam  1 mg Oral TID  . atorvastatin  20 mg Oral q1800  . chlorhexidine gluconate (MEDLINE KIT)  15 mL Mouth Rinse BID  . Chlorhexidine Gluconate Cloth  6 each Topical Daily  . enoxaparin (LOVENOX) injection  40 mg Subcutaneous Q24H  . feeding supplement (PRO-STAT SUGAR FREE 64)  30 mL Per Tube Daily  . finasteride  5 mg Oral Daily  .  ipratropium-albuterol  3 mL Nebulization BID  . mouth rinse  15 mL Mouth Rinse 10 times per day  . pantoprazole sodium  40 mg Per Tube Daily   Continuous Infusions: . feeding supplement (JEVITY 1.2 CAL) 1,000 mL (12/08/18 2014)     LOS: 10 days     Georgette Shell, MD [facility] If 7PM-7AM, please contact night-coverage [email or web address] Password [facility] 12/09/2018, 12:23 PM

## 2018-12-09 NOTE — Progress Notes (Signed)
NCM spoke with Mellody Dance [phone], regarding SNF's he would like CSW to work on , [facility] in [place] 910 [phone], [facility] in [place].  This NCM informed him that would give this information to the CSW and have her to contact him.  NCM informed CSW Hunt[place] Dec, she states she will try to contact son later today.

## 2018-12-09 NOTE — Progress Notes (Signed)
Physical Therapy Treatment Patient Details Name: Robert Pham MRN: [id number] DOB: 1951/05/02 Today's Date: 12/09/2018    History of Present Illness 68 y.o. 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 Patient admitted from [facility], on chronic trach and vent dependent. Pt is nonverbal and unable to give history. Per chart review, trach was most likely placed sometime in March 2020.    PT Comments    Pt admitted with above diagnosis. Pt currently with functional limitations due to the deficits listed below (see PT Problem List). Pt was able to ambulate a short distance today as he had PMV in place 30 min prior to PT and was fatigued from that as well as went to bathroom and once he finished using bathroom was too wiped out to walk in hall.  O2 requirements with walking were 45% on Trach collar to keep sats >90%.  Pt much more fatigued to day and DOE 3/4-4/4 at times.  Will continue to progress as pt tolerates.   Pt will benefit from skilled PT to increase their independence and safety with mobility to allow discharge to the venue listed below.     Follow Up Recommendations  SNF;Supervision/Assistance - 24 hour     Equipment Recommendations  None recommended by PT    Recommendations for Other Services       Precautions / Restrictions Precautions Precautions: Fall Restrictions Weight Bearing Restrictions: No    Mobility  Bed Mobility               General bed mobility comments: in chair on arrival  Transfers Overall transfer level: Needs assistance Equipment used: Rolling walker (2 wheeled) Transfers: Sit to/from Stand Sit to Stand: Min guard Stand pivot transfers: Min guard       General transfer comment: Impulsive therefore needs cues for  safety.   Ambulation/Gait Ambulation/Gait assistance: Min guard;Min assist;+2 safety/equipment Gait Distance (Feet): 25 Feet(10 feet then 5 feet then 10 feet) Assistive device: 4-wheeled walker Gait Pattern/deviations: Step-through pattern;Decreased stride length;Trunk flexed   Gait velocity interpretation: 1.31 - 2.62 ft/sec, indicative of limited community ambulator General Gait Details: On arrival, pt had had PMV on for 30 min.  Pt was struggling to breathe a little with talking with nurse initially and decided to remove the PMV for walking.  Pt was 90% on 28% trach collar.  Pt told PT he needed to go to bathroom first. Walked to bathroom with min guard assist and needed to have BM.  Placed on 45% O2 before sat would register and pt dropped to 80% with the walk to bathroom with DOE 3/4.  Improved with sitting on toilet to >90%.  Once done, pt wanted to walk in hall but got 5 feet and then wanted to rest on Rollator seat.  Pt stated he couldn't walk in hall and needed to go back to chair thefore after a rest walked back to chair again desaturating to 80% with walk and then return to 92% once sitting and rest a few imin.  Was able to place pt back on 28% at rest with >90%.     Stairs             Wheelchair Mobility    Modified Rankin (Stroke Patients Only)  Balance Overall balance assessment: Needs assistance Sitting-balance support: No upper extremity supported;Feet supported Sitting balance-Leahy Scale: Good     Standing balance support: No upper extremity supported;During functional activity Standing balance-Leahy Scale: Fair Standing balance comment: can stand staticaly without UE support                            Cognition Arousal/Alertness: Awake/alert Behavior During Therapy: Impulsive Overall Cognitive Status: Within Functional Limits for tasks assessed                                 General Comments: Wants to move fast       Exercises General Exercises - Lower Extremity Long Arc Quad: AROM;Both;10 reps;Seated    General Comments        Pertinent Vitals/Pain Pain Assessment: No/denies pain    Home Living                      Prior Function            PT Goals (current goals can now be found in the care plan section) Acute Rehab PT Goals Patient Stated Goal: to get better Progress towards PT goals: Progressing toward goals    Frequency    Min 3X/week      PT Plan Current plan remains appropriate    Co-evaluation              AM-PAC PT "6 Clicks" Mobility   Outcome Measure  Help needed turning from your back to your side while in a flat bed without using bedrails?: None Help needed moving from lying on your back to sitting on the side of a flat bed without using bedrails?: None Help needed moving to and from a bed to a chair (including a wheelchair)?: None Help needed standing up from a chair using your arms (e.g., wheelchair or bedside chair)?: A Little Help needed to walk in hospital room?: A Little Help needed climbing 3-5 steps with a railing? : A Little 6 Click Score: 21    End of Session Equipment Utilized During Treatment: Gait belt;Oxygen(45% O2 during amb via trach) Activity Tolerance: Patient tolerated treatment well Patient left: in chair;with call bell/phone within reach Nurse Communication: Mobility status PT Visit Diagnosis: Unsteadiness on feet (R26.81);Muscle weakness (generalized) (M62.81)     Time: 1142-1217 PT Time Calculation (min)4098-1191 (ACUTE ONLY): 35 min  Charges:  $Gait Training: 23-37 mins                     Javayah Magaw,PT Acute Rehabilitation Services Pager:  [phone]  Office:  [phone]     Berline LopesDawn F Jaqwon Manfred 12/09/2018, 1:44 PM

## 2018-12-09 NOTE — Progress Notes (Signed)
  Speech Language Pathology Treatment: Dysphagia;Passy Muir Speaking valve  Patient Details Name: Robert Pham MRN: [id number] DOB: 1951-04-18 Today's Date: 12/09/2018 Time: 3734-2876 SLP Time Calculation (min) (ACUTE ONLY): 60 min  Assessment / Plan / Recommendation Clinical Impression  PMSV: Pt cuff was almost completely deflated upon arrival of SLP. Pt tolerated full cuff deflation without difficulty. Pt was agreeable to PMSV placement, and immediate strong clear voice was noted. Pt tolerated valve placement for over an hour, at which time valve was removed and returned to holding cup for pt to work with physical therapy. Recommend PMSV placement when cuff is fully deflated, during all waking hours. Precautions posted in pt room.  DYSPHAGIA: Pt asked when he would be able to have another swallow evaluation. Pt was given trials of thin liquid and puree. He appeared to tolerate each consistency without oral issues, overt s/s aspiration, or decline in stats. Recommend beginning with puree diet and thin liquids. SLP will follow for diet tolerance and education. Precautions posted at [facility].   RN and MD informed of pt performance today, and recommendations for po diet and increased PMSV use. SLP will continue to educate pt re: donning/doffing, and cleaning of PMSV, but was encouraged to allow staff to manage that until education is completed. Pt voiced understanding.   HPI HPI: 68 y.o. male with history of COPD with trach/PEG and vent dependent presently on transplant list at [facility], hypertension presents to the ER with complaints of increasing shortness of breath over the last 3 days and has been having increasing bloody discharge over the last 1 to 2 weeks. Patient admitted from [facility].      SLP Plan  Continue with current plan of care       Recommendations  Diet recommendations: Dysphagia 1 (puree);Thin liquid Liquids provided via: Cup;Straw Medication Administration: (as  tolerated) Supervision: Patient able to self feed;Intermittent supervision to cue for compensatory strategies Compensations: Minimize environmental distractions;Slow rate;Small sips/bites Postural Changes and/or Swallow Maneuvers: Seated upright 90 degrees;Upright 30-60 min after meal      Patient may use Passy-Muir Speech Valve: During all waking hours (remove during sleep) PMSV Supervision: Intermittent MD: Please consider changing trach tube to : Smaller size;Cuffless         Oral Care Recommendations: Oral care QID Follow up Recommendations: Skilled Nursing facility SLP Visit Diagnosis: Aphonia (R49.1);Dysphagia, unspecified (R13.10) Plan: Continue with current plan of care       GO               Million Maharaj B. Murvin Natal [facility], CCC-SLP Speech Language Pathologist [phone]  Leigh Aurora 12/09/2018, 11:52 AM

## 2018-12-10 LAB — GLUCOSE, CAPILLARY
Glucose-Capillary: 114 mg/dL — ABNORMAL HIGH (ref 70–99)
Glucose-Capillary: 125 mg/dL — ABNORMAL HIGH (ref 70–99)
Glucose-Capillary: 124 mg/dL — ABNORMAL HIGH (ref 70–99)
Glucose-Capillary: 124 mg/dL — ABNORMAL HIGH (ref 70–99)
Glucose-Capillary: 122 mg/dL — ABNORMAL HIGH (ref 70–99)
Glucose-Capillary: 119 mg/dL — ABNORMAL HIGH (ref 70–99)

## 2018-12-10 NOTE — Progress Notes (Signed)
PROGRESS NOTE    Robert Pham  NKN:[id number] DOB: [birth year] DOA: 11/28/2018 PCP: Townsend Roger, MD  Brief Narrative:68 y.o.malewithhistory of COPD with trach PEG and vent dependent presently on transplant list at [facility], hypertension presents to the ER with complaints of increasing shortness of breath over the last 3 days and has been having increasing bloody discharge over the last 1 to 2 weeks. Denies any chest pain fever chills nausea vomiting abdominal pain or diarrhea. In addition patient has noticed increasing swelling of the lower extremities bilaterally over the last few days.  In the ER patient was afebrile chest x-ray was not showing anything acute. Had some bloody discharge from the trach area. On-call pulmonary critical care was consulted and patient placed on nebulizer treatment IV steroids for COPD exacerbation. Patient's right upper extremity PICC line area looks slightly red and erythematous for which PICC line was removed and placed on empiric antibiotics after blood cultures obtained. Per report patient had recent Candida UTI and also had Pseudomonas pneumonia. Patient also was given Lasix 20 mg IV in the ER for lower extremity edema. Patient's labs show creatinine of 0.5 sodium 140 potassium 4.7 BNP 75.2 hemoglobin 10.3 platelets 460. EKG sinus tachycardia  Assessment & Plan:   Principal Problem:   Acute respiratory failure with hypoxia (HCC) Active Problems:   COPD exacerbation (HCC)   Essential hypertension   Pressure injury of skin  Acute on chronic respiratory failure with hypoxia COPD exacerbation Trach and vent dependence Patient presenting from [facility] with progressive shortness of breath. Patient was admitted to the intensive care unit and placed on full vent support. Respiratory viral panel and COVID-19 test were both negative. Patient was started on scheduled nebulized treatments and completed a 5-day course of IV steroids and antibiotics with  cefepime. Blood cultures 1 out of 4 were positive for coagulase-negative Staphylococcus which was likely contaminant. Patient did well and was transitioned off of vent support back to trach collar. --Continue trach collar100% still on28% FiO2;--vent prn --Unfortunately patient does not want to return back to [facility]; and SNF require uncuffed trach with maximum FiO2 of 28% and trach would also have to be 40 days old prior to dc per CM. --Patient states working with family to see if there are other options in the [place] area --Continue respiratory therapy with nebs and suctioning as needed --Speech therapy advanced diet to thin liquids and puree since he tolerated deflation and pmv -Chest x-ray 12/08/2018 no new infiltrates  Essential hypertensionblood pressure running soft to DC lisinopril.  Anemia Hemoglobin9.9; Stable  FXT:KWIOXBDZHGD 5 mg p.o. daily  JME:QASTMHDQ statin  Anxiety: change  alprazolam 0.5 mg p.o. TID  and clonidine 0.1 mg qHS  GERD:Continue PPI   DVT prophylaxis:Lovenox Code Status:Full code Family Communication:none Disposition Plan:Await LTAC placement versus SNF  Consultants:  PCCM -signed off 5/29  Procedures: Transthoracic echocardiogram 11/29/2018: IMPRESSIONS 1. The left ventricle has normal systolic function with an ejection fraction of 60-65%. The cavity size was normal. Left ventricular diastolic Doppler parameters are consistent with impaired relaxation. No evidence of left ventricular regional wall  motion abnormalities. 2. The right ventricle has normal systolic function. The cavity was mildly enlarged. There is no increase in right ventricular wall thickness. Right ventricular systolic pressure could not be assessed. 3. Trivial pericardial effusion is present. 4. The aortic root is normal in size and structure.  Vascular ultrasound lower extremities: Summary: Right: There is no evidence of deep vein  thrombosis in the lower extremity. No  cystic structure found in the popliteal fossa. Left: There is no evidence of deep vein thrombosis in the lower extremity. No cystic structure found in the popliteal fossa.  Antimicrobials:   Cefepime 5/26 - 5/29  Vancomycin 5/26 - 5/2 Pressure Injury 11/29/18 Stage I -  Intact skin with non-blanchable redness of a localized area usually over a bony prominence. 9cm x 7cm reddened area to sacrum (Active)  11/29/18 1054  Location: Sacrum  Location Orientation: Posterior  Staging: Stage I -  Intact skin with non-blanchable redness of a localized area usually over a bony prominence.  Wound Description (Comments): 9cm x 7cm reddened area to sacrum  Present on Admission: Yes      Nutrition Problem: Inadequate oral intake Etiology: (chronic trach and vent dependent)     Signs/Symptoms: NPO status    Interventions: Tube feeding, Refer to RD note for recommendations  Estimated body mass index is 19.81 kg/m as calculated from the following:   Height as of this encounter: '5\' 5"'$  (1.651 m).   Weight as of this encounter: 54 kg.     Subjective:  When I walked into the room he was sitting up in his chair appeared comfortable as soon as he saw me he started breathing harder and heavier.  He put on his PMV valve and started speaking to me it appeared clear and in no distress. Objective: Vitals:   12/10/18 0723 12/10/18 0727 12/10/18 1143 12/10/18 1222  BP:   (!) 134/120 (!) 112/91  Pulse:  (!) 104 (!) 106 (!) 104  Resp:  (!) 26  16  Temp:    97.9 F (36.6 C)  TempSrc:    Oral  SpO2: 93% (!) 87% 94% 92%  Weight:      Height:        Intake/Output Summary (Last 24 hours) at 12/10/2018 1226 Last data filed at 12/10/2018 0400 Gross per 24 hour  Intake 1590 ml  Output 1100 ml  Net 490 ml   Filed Weights   12/07/18 0620 12/08/18 0418 12/10/18 0320  Weight: 56.1 kg 52.6 kg 54 kg    Examination:  General exam: Appears calm and comfortable   Respiratory system: Clear to auscultation. Respiratory effort normal. Cardiovascular system: S1 & S2 heard, RRR. No JVD, murmurs, rubs, gallops or clicks. No pedal edema. Gastrointestinal system: Abdomen is nondistended, soft and nontender. No organomegaly or masses felt. Normal bowel sounds heard.  PEG tube in place Central nervous system: Alert and oriented. No focal neurological deficits. Extremities: Symmetric 5 x 5 power. Skin: No rashes, lesions or ulcers Psychiatry: Judgement and insight appear normal. Mood & affect appropriate.     Data Reviewed: I have personally reviewed following labs and imaging studies  CBC: Recent Labs  Lab 12/04/18 0238 12/05/18 0359 12/07/18 0419  WBC 9.1 10.7* 9.3  HGB 8.7* 8.9* 9.9*  HCT 28.9* 29.4* 33.4*  MCV 87.3 87.2 88.8  PLT 323 360 932   Basic Metabolic Panel: Recent Labs  Lab 12/04/18 0238 12/05/18 0359 12/07/18 0419  NA 141 140 137  K 3.8 4.1 3.9  CL 99 103 100  CO2 32 29 30  GLUCOSE 130* 142* 114*  BUN '23 21 18  '$ CREATININE 0.47* 0.51* 0.53*  CALCIUM 9.3 9.0 9.4  MG 2.1  --  2.0  PHOS 3.6  --   --    GFR: Estimated Creatinine Clearance: 67.5 mL/min (A) (by C-G formula based on SCr of 0.53 mg/dL (L)). Liver Function Tests: No results for input(s):  AST, ALT, ALKPHOS, BILITOT, PROT, ALBUMIN in the last 168 hours. No results for input(s): LIPASE, AMYLASE in the last 168 hours. No results for input(s): AMMONIA in the last 168 hours. Coagulation Profile: No results for input(s): INR, PROTIME in the last 168 hours. Cardiac Enzymes: No results for input(s): CKTOTAL, CKMB, CKMBINDEX, TROPONINI in the last 168 hours. BNP (last 3 results) No results for input(s): PROBNP in the last 8760 hours. HbA1C: No results for input(s): HGBA1C in the last 72 hours. CBG: Recent Labs  Lab 12/09/18 1623 12/10/18 0129 12/10/18 0302 12/10/18 0743 12/10/18 1143  GLUCAP 96 114* 125* 124* 124*   Lipid Profile: No results for input(s):  CHOL, HDL, LDLCALC, TRIG, CHOLHDL, LDLDIRECT in the last 72 hours. Thyroid Function Tests: No results for input(s): TSH, T4TOTAL, FREET4, T3FREE, THYROIDAB in the last 72 hours. Anemia Panel: No results for input(s): VITAMINB12, FOLATE, FERRITIN, TIBC, IRON, RETICCTPCT in the last 72 hours. Sepsis Labs: No results for input(s): PROCALCITON, LATICACIDVEN in the last 168 hours.  No results found for this or any previous visit (from the past 240 hour(s)).       Radiology Studies: No results found.      Scheduled Meds: . ALPRAZolam  1 mg Oral TID  . atorvastatin  20 mg Oral q1800  . chlorhexidine gluconate (MEDLINE KIT)  15 mL Mouth Rinse BID  . Chlorhexidine Gluconate Cloth  6 each Topical Daily  . enoxaparin (LOVENOX) injection  40 mg Subcutaneous Q24H  . feeding supplement (PRO-STAT SUGAR FREE 64)  30 mL Per Tube Daily  . finasteride  5 mg Oral Daily  . ipratropium-albuterol  3 mL Nebulization BID  . mouth rinse  15 mL Mouth Rinse 10 times per day  . pantoprazole sodium  40 mg Per Tube Daily   Continuous Infusions: . feeding supplement (JEVITY 1.2 CAL) 60 mL/hr (12/10/18 0830)     LOS: 11 days     Georgette Shell, MD [facility]  If 7PM-7AM, please contact night-coverage [email or web address] Password [email or web address] 12/10/2018, 12:26 PM

## 2018-12-10 NOTE — Plan of Care (Signed)
  Problem: Education: Goal: Knowledge about tracheostomy care/management will improve Outcome: Progressing   Problem: Activity: Goal: Ability to tolerate increased activity will improve Outcome: Progressing   Problem: Pain Managment: Goal: General experience of comfort will improve Outcome: Progressing   Problem: Health Behavior/Discharge Planning: Goal: Ability to manage tracheostomy will improve Outcome: Not Progressing

## 2018-12-11 ENCOUNTER — Inpatient Hospital Stay (HOSPITAL_COMMUNITY): Payer: Medicare Other

## 2018-12-11 LAB — GLUCOSE, CAPILLARY
Glucose-Capillary: 114 mg/dL — ABNORMAL HIGH (ref 70–99)
Glucose-Capillary: 119 mg/dL — ABNORMAL HIGH (ref 70–99)
Glucose-Capillary: 129 mg/dL — ABNORMAL HIGH (ref 70–99)
Glucose-Capillary: 135 mg/dL — ABNORMAL HIGH (ref 70–99)
Glucose-Capillary: 139 mg/dL — ABNORMAL HIGH (ref 70–99)
Glucose-Capillary: 77 mg/dL (ref 70–99)

## 2018-12-11 MED ORDER — LORAZEPAM 2 MG/ML IJ SOLN
1.0000 mg | Freq: Once | INTRAMUSCULAR | Status: AC
  Administered 2018-12-11: 1 mg via INTRAVENOUS
  Filled 2018-12-11: qty 1

## 2018-12-11 MED ORDER — LORAZEPAM 2 MG/ML IJ SOLN
0.5000 mg | Freq: Four times a day (QID) | INTRAMUSCULAR | Status: DC | PRN
  Administered 2018-12-11: 0.5 mg via INTRAVENOUS
  Filled 2018-12-11 (×2): qty 1

## 2018-12-11 NOTE — Progress Notes (Addendum)
PROGRESS NOTE    Robert Pham  URK:[id number] DOB: 1951-01-09 DOA: 11/28/2018 PCP: Townsend Roger, MD  Brief Narrative:68 y.o.malewithhistory of COPD with trach PEG and vent dependent presently on transplant list at [facility], hypertension presents to the ER with complaints of increasing shortness of breath over the last 3 days and has been having increasing bloody discharge over the last 1 to 2 weeks. Denies any chest pain fever chills nausea vomiting abdominal pain or diarrhea. In addition patient has noticed increasing swelling of the lower extremities bilaterally over the last few days.  In the ER patient was afebrile chest x-ray was not showing anything acute. Had some bloody discharge from the trach area. On-call pulmonary critical care was consulted and patient placed on nebulizer treatment IV steroids for COPD exacerbation. Patient's right upper extremity PICC line area looks slightly red and erythematous for which PICC line was removed and placed on empiric antibiotics after blood cultures obtained. Per report patient had recent Candida UTI and also had Pseudomonas pneumonia. Patient also was given Lasix 20 mg IV in the ER for lower extremity edema. Patient's labs show creatinine of 0.5 sodium 140 potassium 4.7 BNP 75.2 hemoglobin 10.3 platelets 460. EKG sinus tachycardia  Assessment & Plan:   Principal Problem:   Acute respiratory failure with hypoxia (HCC) Active Problems:   COPD exacerbation (HCC)   Essential hypertension   Pressure injury of skin  Acute on chronic respiratory failure with hypoxia COPD exacerbation Trach and vent dependence Patient presenting from [facility] with progressive shortness of breath. Patient was admitted to the intensive care unit and placed on full vent support. Respiratory viral panel and COVID-19 test were both negative. Patient was started on scheduled nebulized treatments and completed a 5-day course of IV steroids and antibiotics with  cefepime. Blood cultures 1 out of 4 were positive for coagulase-negative Staphylococcus which was likely contaminant. Patient did well and was transitioned off of vent support back to trach collar. --Continue trach collar100% still on28% FiO2;--vent prn --Unfortunately patient does not want to return back to [facility]; and SNF require uncuffed trach with maximum FiO2 of 28% and trach would also have to be 9 days old prior to dc per CM. --Patient states working with family to see if there are other options in the [place] area --Continue respiratory therapy with nebs and suctioning as needed --Speech therapyadvanced diet to thin liquids and puree since he tolerated deflation and pmv-Chest x-ray 12/08/2018 no new infiltrates  Essential hypertensionblood pressure running soft to DC lisinopril.  Anemia Hemoglobin9.9; Stable  GBT:DVVOHYWVPXT 5 mg p.o. daily  GGY:IRSWNIOE statin  Anxiety: changealprazolam 0.5 mg p.o. TID and clonidine 0.1 mg qHS  GERD:Continue PPI  Left foot drop needs ongoing PT  DVT prophylaxis:Lovenox Code Status:Full code Family Communication:none Disposition Plan:Await LTAC placement versus SNF  Consultants:  PCCM -signed off 5/29  Procedures: Transthoracic echocardiogram 11/29/2018: IMPRESSIONS 1. The left ventricle has normal systolic function with an ejection fraction of 60-65%. The cavity size was normal. Left ventricular diastolic Doppler parameters are consistent with impaired relaxation. No evidence of left ventricular regional wall  motion abnormalities. 2. The right ventricle has normal systolic function. The cavity was mildly enlarged. There is no increase in right ventricular wall thickness. Right ventricular systolic pressure could not be assessed. 3. Trivial pericardial effusion is present. 4. The aortic root is normal in size and structure.  Vascular ultrasound lower extremities: Summary: Right: There is  no evidence of deep vein thrombosis in the lower extremity.  No cystic structure found in the popliteal fossa. Left: There is no evidence of deep vein thrombosis in the lower extremity. No cystic structure found in the popliteal fossa.  Antimicrobials:   Cefepime 5/26 - 5/29  Vancomycin 5/26 - 5/2 Pressure Injury 11/29/18 Stage I -  Intact skin with non-blanchable redness of a localized area usually over a bony prominence. 9cm x 7cm reddened area to sacrum (Active)  11/29/18 1054  Location: Sacrum  Location Orientation: Posterior  Staging: Stage I -  Intact skin with non-blanchable redness of a localized area usually over a bony prominence.  Wound Description (Comments): 9cm x 7cm reddened area to sacrum  Present on Admission: Yes      Nutrition Problem: Inadequate oral intake Etiology: (chronic trach and vent dependent)     Signs/Symptoms: NPO status    Interventions: Tube feeding, Refer to RD note for recommendations  Estimated body mass index is 19.81 kg/m as calculated from the following:   Height as of this encounter: '5\' 5"'$  (1.651 m).   Weight as of this encounter: 54 kg.    Subjective: Patient sitting up in chair appears comfortable in no distress requesting Ativan does not want Xanax  Objective: Vitals:   12/11/18 0804 12/11/18 0840 12/11/18 1211 12/11/18 1233  BP:  127/81 138/82 138/82  Pulse: (!) 105 93 89 88  Resp: (!) 22 19 (!) 27 (!) 23  Temp:  98.1 F (36.7 C) 97.8 F (36.6 C)   TempSrc:  Oral Oral   SpO2: 96% 99% 100% 100%  Weight:      Height:        Intake/Output Summary (Last 24 hours) at 12/11/2018 1237 Last data filed at 12/11/2018 0600 Gross per 24 hour  Intake 360 ml  Output 500 ml  Net -140 ml   Filed Weights   12/07/18 0620 12/08/18 0418 12/10/18 0320  Weight: 56.1 kg 52.6 kg 54 kg    Examination:  General exam: Appears calm and comfortable  Respiratory system: Clear to auscultation. Respiratory effort normal.  Trach in place  Cardiovascular system: S1 & S2 heard, RRR. No JVD, murmurs, rubs, gallops or clicks. No pedal edema. Gastrointestinal system: Abdomen is nondistended, soft and nontender. No organomegaly or masses felt. Normal bowel sounds heard. Central nervous system: Alert and oriented. No focal neurological deficits. Extremities: Left ankle edema more than the right with left foot drop sensation intact  skin: No rashes, lesions or ulcers Psychiatry: Judgement and insight appear normal. Mood & affect appropriate.     Data Reviewed: I have personally reviewed following labs and imaging studies  CBC: Recent Labs  Lab 12/05/18 0359 12/07/18 0419  WBC 10.7* 9.3  HGB 8.9* 9.9*  HCT 29.4* 33.4*  MCV 87.2 88.8  PLT 360 315   Basic Metabolic Panel: Recent Labs  Lab 12/05/18 0359 12/07/18 0419  NA 140 137  K 4.1 3.9  CL 103 100  CO2 29 30  GLUCOSE 142* 114*  BUN 21 18  CREATININE 0.51* 0.53*  CALCIUM 9.0 9.4  MG  --  2.0   GFR: Estimated Creatinine Clearance: 67.5 mL/min (A) (by C-G formula based on SCr of 0.53 mg/dL (L)). Liver Function Tests: No results for input(s): AST, ALT, ALKPHOS, BILITOT, PROT, ALBUMIN in the last 168 hours. No results for input(s): LIPASE, AMYLASE in the last 168 hours. No results for input(s): AMMONIA in the last 168 hours. Coagulation Profile: No results for input(s): INR, PROTIME in the last 168 hours. Cardiac Enzymes: No results  for input(s): CKTOTAL, CKMB, CKMBINDEX, TROPONINI in the last 168 hours. BNP (last 3 results) No results for input(s): PROBNP in the last 8760 hours. HbA1C: No results for input(s): HGBA1C in the last 72 hours. CBG: Recent Labs  Lab 12/10/18 1622 12/10/18 1951 12/11/18 0007 12/11/18 0602 12/11/18 0839  GLUCAP 122* 119* 129* 139* 135*   Lipid Profile: No results for input(s): CHOL, HDL, LDLCALC, TRIG, CHOLHDL, LDLDIRECT in the last 72 hours. Thyroid Function Tests: No results for input(s): TSH, T4TOTAL, FREET4, T3FREE,  THYROIDAB in the last 72 hours. Anemia Panel: No results for input(s): VITAMINB12, FOLATE, FERRITIN, TIBC, IRON, RETICCTPCT in the last 72 hours. Sepsis Labs: No results for input(s): PROCALCITON, LATICACIDVEN in the last 168 hours.  No results found for this or any previous visit (from the past 240 hour(s)).       Radiology Studies: Dg Chest Port 1 View  Result Date: 12/11/2018 CLINICAL DATA:  Shortness of breath EXAM: PORTABLE CHEST 1 VIEW COMPARISON:  December 08, 2018. FINDINGS: Tracheostomy catheter tip is 7.5 cm above the carina. No pneumothorax. Lungs are hyperexpanded with scattered areas of scarring bilaterally. There is apparent bullous disease in the upper lobes. There is no frank edema or consolidation. The heart size is normal. The pulmonary vascularity reflects underlying emphysematous change with diminished upper lobe vascularity, stable. No adenopathy. There is aortic atherosclerosis. No bone lesions. IMPRESSION: Tracheostomy as described. 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 No appreciable edema or consolidation. Stable cardiac silhouette. There is aortic atherosclerosis. Electronically Signed   By: Lowella Grip III M.D.   On: 12/11/2018 08:46        Scheduled Meds: . ALPRAZolam  1 mg Oral TID  . atorvastatin  20 mg Oral q1800  . chlorhexidine gluconate (MEDLINE KIT)  15 mL Mouth Rinse BID  . Chlorhexidine Gluconate Cloth  6 each Topical Daily  . enoxaparin (LOVENOX) injection  40 mg Subcutaneous Q24H  . feeding supplement (PRO-STAT SUGAR FREE 64)  30 mL Per Tube Daily  . finasteride  5 mg Oral Daily  . ipratropium-albuterol  3 mL Nebulization BID  . mouth rinse  15 mL Mouth Rinse 10 times per day  . pantoprazole sodium  40 mg Per Tube Daily   Continuous Infusions: . feeding supplement (JEVITY 1.2 CAL) 60 mL/hr (12/10/18 0830)     LOS: 12 days     Georgette Shell, MD [facility]  If  7PM-7AM, please contact night-coverage [email or web address] Password [facility] 12/11/2018, 12:37 PM

## 2018-12-11 NOTE — Progress Notes (Signed)
Daily Progress Note:  Received report from [place], Therapist, sports. Patient was resting comfortably in chair this morning. Patient complained of hip pain and shortness of breath. Patient requested both lorazepam and fentanyl. Around 1100, patient complained of increased shortness of breathe and requested to see the doctor. Patient also requested a breathing treatment and more medication for anxiety/pain. After changing inner cannula of trach and allowing patient to equilibrate, patient seemed to be more calm. Patient also voiced concerns of decreased range of motion in his L foot. These concerns were passed on to his doctor. Spoke with patient's eldest son Legrand Como multiple times today, Legrand Como voiced his concerns about Zandon's anxiety and decreased range of motion in L foot.    Note created by Griffin Basil, Nurse Extern

## 2018-12-12 ENCOUNTER — Inpatient Hospital Stay (HOSPITAL_COMMUNITY): Payer: Medicare Other

## 2018-12-12 LAB — COMPREHENSIVE METABOLIC PANEL
ALT: 22 U/L (ref 0–44)
AST: 19 U/L (ref 15–41)
Albumin: 3.4 g/dL — ABNORMAL LOW (ref 3.5–5.0)
Alkaline Phosphatase: 78 U/L (ref 38–126)
Anion gap: 9 (ref 5–15)
BUN: 17 mg/dL (ref 8–23)
CO2: 29 mmol/L (ref 22–32)
Calcium: 10 mg/dL (ref 8.9–10.3)
Chloride: 100 mmol/L (ref 98–111)
Creatinine, Ser: 0.54 mg/dL — ABNORMAL LOW (ref 0.61–1.24)
GFR calc Af Amer: >60 mL/min (ref >60)
GFR calc non Af Amer: >60 mL/min (ref >60)
Glucose, Bld: 117 mg/dL — ABNORMAL HIGH (ref 70–99)
Potassium: 3.9 mmol/L (ref 3.5–5.1)
Sodium: 138 mmol/L (ref 135–145)
Total Bilirubin: 0.3 mg/dL (ref 0.3–1.2)
Total Protein: 6.7 g/dL (ref 6.5–8.1)

## 2018-12-12 LAB — CBC
HCT: 33.2 % — ABNORMAL LOW (ref 39.0–52.0)
Hemoglobin: 10 g/dL — ABNORMAL LOW (ref 13.0–17.0)
MCH: 26.7 pg (ref 26.0–34.0)
MCHC: 30.1 g/dL (ref 30.0–36.0)
MCV: 88.5 fL (ref 80.0–100.0)
Platelets: 365 10*3/uL (ref 150–400)
RBC: 3.75 MIL/uL — ABNORMAL LOW (ref 4.22–5.81)
RDW: 15.5 % (ref 11.5–15.5)
WBC: 11.2 10*3/uL — ABNORMAL HIGH (ref 4.0–10.5)
nRBC: 0 % (ref 0.0–0.2)

## 2018-12-12 LAB — GLUCOSE, CAPILLARY
Glucose-Capillary: 101 mg/dL — ABNORMAL HIGH (ref 70–99)
Glucose-Capillary: 126 mg/dL — ABNORMAL HIGH (ref 70–99)
Glucose-Capillary: 128 mg/dL — ABNORMAL HIGH (ref 70–99)
Glucose-Capillary: 131 mg/dL — ABNORMAL HIGH (ref 70–99)
Glucose-Capillary: 147 mg/dL — ABNORMAL HIGH (ref 70–99)
Glucose-Capillary: 82 mg/dL (ref 70–99)
Glucose-Capillary: 99 mg/dL (ref 70–99)

## 2018-12-12 MED ORDER — ALPRAZOLAM 0.5 MG PO TABS
0.5000 mg | ORAL_TABLET | Freq: Three times a day (TID) | ORAL | Status: DC
  Administered 2018-12-12 – 2018-12-14 (×6): 0.5 mg via ORAL
  Filled 2018-12-12 (×7): qty 1

## 2018-12-12 NOTE — Progress Notes (Signed)
Patient has been on his call bell throughout this shift. He slept for an hour the whole night. Got disoriented when he woke up from sleep and was re-oriented by staff. . Patient informed this RN he's not save here because the NT yield at him when he accidentally pulled his call bell from the wall. Re-assurance provided to the patient by this RN that he's save here. Patient requested for Fantanyl  50 mcg iv  PRN x 2  this shift. He indicated  to give 25 mcg for the 2nd time after pulling the 50 mcg and scanned it so I had to go back and find a nurse to wast it the 2nd time. No respiratory distress noted. Will continue to monitor.

## 2018-12-12 NOTE — Care Management Important Message (Signed)
Important Message  Patient Details  Name: Robert Pham MRN: [id number] Date of Birth: 05-Dec-1950   Medicare Important Message Given:  Yes    Shelda Altes 12/12/2018, 1:13 PM

## 2018-12-12 NOTE — Progress Notes (Signed)
Occupational Therapy Treatment Patient Details Name: Otis Portal MRN: [id number] DOB: [birth year] Today's Date: 12/12/2018    History of present illness 68 y.o. 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 Patient admitted from [facility], on chronic trach and vent dependent. Pt is nonverbal and unable to give history. Per chart review, trach was most likely placed sometime in March 2020.   OT comments  Upon entering the room, pt moving to sit on EOB. Pt writing , " Go Home!". OT providing education to pt regarding plans for discharge and pt displaying limited insight to deficits. Pt began crying and appears very anxious. He did put in PSM independently with encouragement. OT providing therapeutic use of self and encouraged pt to participate. He began to report feeling like he could not breathe but O2 saturation 95-100% on 9 L of O2. Pt is likely very anxious but has been refusing medication per RN when reported. Pt could benefit from neuropsych visit as well. Pt refused all other OT intervention this session. Pt remained seated on EOB and OT encouraged pt to eat lunch. Call bell and all needed items within reach.   Follow Up Recommendations  SNF    Equipment Recommendations  Other (comment)(defer to next venue)    Recommendations for Other Services Other (comment)(neuropsych)    Precautions / Restrictions Precautions Precautions: Fall Restrictions Weight Bearing Restrictions: No       Mobility Bed Mobility Overal bed mobility: Independent           Balance Overall balance assessment: Needs assistance Sitting-balance support: No upper extremity supported;Feet supported Sitting balance-Leahy Scale: Good        ADL either performed or assessed with  clinical judgement        Cognition Arousal/Alertness: Awake/alert Behavior During Therapy: Impulsive;Anxious;Agitated Overall Cognitive Status: Within Functional Limits for tasks assessed                    Pertinent Vitals/ Pain       Pain Assessment: No/denies pain         Frequency  Min 2X/week        Progress Toward Goals  OT Goals(current goals can now be found in the care plan section)  Progress towards OT goals: Progressing toward goals  Acute Rehab OT Goals Patient Stated Goal: to go home OT Goal Formulation: With patient Time For Goal Achievement: 12/26/18 Potential to Achieve Goals: Good  Plan Discharge plan remains appropriate       AM-PAC OT "6 Clicks" Daily Activity     Outcome Measure   Help from another person eating meals?: A Little Help from another person taking care of personal grooming?: A Little Help from another person toileting, which includes using toliet, bedpan, or urinal?: A Little Help from another person bathing (including washing, rinsing, drying)?: A Little Help from another person to put on and taking off regular upper body clothing?: A Little Help from another person to put on and taking off regular lower body clothing?: A Little 6 Click Score: 18    End of Session Equipment Utilized During Treatment: Oxygen  OT Visit Diagnosis: Other abnormalities of gait and mobility (R26.89);Muscle weakness (generalized) (M62.81)   Activity Tolerance Patient limited by fatigue   Patient Left in bed;with  call bell/phone within reach;with bed alarm set   Nurse Communication Precautions        Time: 1344-1400 OT Time Calculation (min): 16 min  Charges: OT General Charges $OT Visit: 1 Visit OT Treatments $Therapeutic Activity: 8-22 mins   Rukaya Kleinschmidt P, MS, OTR/L 12/12/2018, 2:20 PM

## 2018-12-12 NOTE — Progress Notes (Signed)
Nutrition Follow-up  INTERVENTION:   -If PO intake is inadequate, resume Jevity 1.2 @ 60 ml/hr x 24 hours and 30 ml Prostat daily via PEG -Recommend free water flushes of 150 ml QID -TF regimen provides 1828 kcal, 94g protein and 1762 ml H2O.  NUTRITION DIAGNOSIS:   Inadequate oral intake related to (chronic trach and vent dependent) as evidenced by NPO status.  Now on dysphagia 1 diet.  GOAL:   Patient will meet greater than or equal to 90% of their needs  Progressing.  MONITOR:   PO intake, Labs, Weight trends, I & O's, TF tolerance  ASSESSMENT:   68 y.o. 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   **RD working remotely**  On 6/5, diet was advanced to dysphagia 1 diet. Last PO documentation was on 6/6, 50% of breakfast. RD has attempted to reach out to RN for PO intakes, no response yet. Per secure chat with MD, TF to be turned off today and resumed tonight to see how much he consumes of his meals. Will continue to monitor intakes. If remain poor, will add nutritional supplement.  Per weight records, pt has lost 6 lbs since 5/27.   Labs reviewed. Medications reviewed.  Diet Order:   Diet Order            DIET - DYS 1 Room service appropriate? Yes; Fluid consistency: Thin  Diet effective now              EDUCATION NEEDS:   Not appropriate for education at this time  Skin:  Skin Assessment: Skin Integrity Issues: Skin Integrity Issues:: Stage I Stage I: sacrum  Last BM:  6/7  Height:   Ht Readings from Last 1 Encounters:  11/29/18 5\' 5"  (1.651 m)    Weight:   Wt Readings from Last 1 Encounters:  12/10/18 54 kg    Ideal Body Weight:  61.8 kg  BMI:  Body mass index is 19.81 kg/m.  Estimated Nutritional Needs:    Kcal:  1700-1900  Protein:  85-95g  Fluid:  1.7L/day  Clayton Bibles, MS, RD, LDN [place] Dietitian Pager: [phone] After Hours Pager: [phone]

## 2018-12-12 NOTE — Progress Notes (Signed)
PT Cancellation Note  Patient Details Name: Gust Eugene MRN: [id number] DOB: 1951-03-04   Cancelled Treatment:    Reason Eval/Treat Not Completed: Other (comment)(refused due to fatigue)   Denice Paradise 12/12/2018, 12:21 PM Damarri Rampy,PT Acute Rehabilitation Services Pager:  [phone]  Office:  [phone]

## 2018-12-12 NOTE — Progress Notes (Signed)
SLP Cancellation Note  Patient Details Name: Robert Pham MRN: [id number] DOB: July 04, 1951   Cancelled treatment:       Reason Eval/Treat Not Completed: Patient declined, no reason specified. Pt was resting upon SLP arrival and was easily awakened but shook his head "no" to attempting any POs or PMV use. Will continue efforts.   Venita Sheffield Malakye Nolden 12/12/2018, 4:21 PM  Pollyann Glen, M.A. [place] Acute Environmental education officer [phone] Office [phone]

## 2018-12-12 NOTE — Progress Notes (Signed)
Patient c/o chest pain 2/10, dull, nonradiating. VSS. Stat EKG ordered. Patient states he does feel anxious. Dr. Zigmund Daniel notified.

## 2018-12-12 NOTE — NC FL2 (Signed)
[place] MEDICAID FL2 LEVEL OF CARE SCREENING TOOL     IDENTIFICATION  Patient Name: Robert Pham Birthdate: 06/04/1951 Sex: male Admission Date (Current Location): 11/28/2018  [facility] and [place] Number:  Herbalist and Address:  [facility], [place] [address], [place], [place] [address]      Provider Number: [id number]  Attending Physician Name and Address:  Georgette Shell, MD  Relative Name and Phone Number:  Sully, [place], [phone]    Current Level of Care: Hospital Recommended Level of Care: [place] Prior Approval Number:    Date Approved/Denied:   PASRR Number: [id number]  Discharge Plan:      Current Diagnoses: Patient Active Problem List   Diagnosis Date Noted  . Pressure injury of skin 12/07/2018  . Acute respiratory failure with hypoxia ([place]) 11/29/2018  . COPD exacerbation ([place]) 11/29/2018  . Essential hypertension 11/29/2018    Orientation RESPIRATION BLADDER Height & Weight     Self, Time, Situation, Place  Tracheostomy(trach, 40%, 91) Continent Weight: 119 lb 0.8 oz (54 kg) Height:  '5\' 5"'$  (165.1 cm)  BEHAVIORAL SYMPTOMS/MOOD NEUROLOGICAL BOWEL NUTRITION STATUS      Continent(Gastromy bag) Diet(Dys 1 diet, thin liquids)  AMBULATORY STATUS COMMUNICATION OF NEEDS Skin   Limited Assist Verbally Bruising, Skin abrasions(brusing on left arm, pressure injury on sacrum)                       Personal Care Assistance Level of Assistance  Bathing, Feeding, Dressing, Total care Bathing Assistance: Limited assistance Feeding assistance: Limited assistance Dressing Assistance: Limited assistance Total Care Assistance: Limited assistance   Functional Limitations Info  Sight, Hearing, Speech Sight Info: Adequate Hearing Info: Adequate Speech Info: Adequate    SPECIAL CARE FACTORS FREQUENCY  PT (By licensed PT), OT (By licensed OT), Speech therapy     PT Frequency: 5x/wk OT  Frequency: 5x/wk     Speech Therapy Frequency: 3x/wk      Contractures Contractures Info: Not present    Additional Factors Info  Code Status, Allergies, Psychotropic Code Status Info: Full Code Allergies Info: Codeine, Klonopin Clonazepam Psychotropic Info: alprazolam .'5mg'$  3x daily         Current Medications (12/12/2018):  This is the current hospital active medication list Current Facility-Administered Medications  Medication Dose Route Frequency Provider Last Rate Last Dose  . acetaminophen (TYLENOL) tablet 650 mg  650 mg Oral Q6H PRN Rise Patience, MD   650 mg at 12/11/18 1634   Or  . acetaminophen (TYLENOL) suppository 650 mg  650 mg Rectal Q6H PRN Rise Patience, MD      . albuterol (PROVENTIL) (2.5 MG/3ML) 0.083% nebulizer solution 2.5 mg  2.5 mg Nebulization Q3H PRN Rise Patience, MD   2.5 mg at 12/12/18 1101  . ALPRAZolam Duanne Moron) tablet 0.5 mg  0.5 mg Oral TID Georgette Shell, MD   0.5 mg at 12/12/18 1434  . atorvastatin (LIPITOR) tablet 20 mg  20 mg Oral q1800 Cristal Generous, NP   20 mg at 12/11/18 1633  . chlorhexidine gluconate (MEDLINE KIT) (PERIDEX) 0.12 % solution 15 mL  15 mL Mouth Rinse BID Lavina Hamman, MD   15 mL at 12/12/18 1151  . Chlorhexidine Gluconate Cloth 2 % PADS 6 each  6 each Topical Daily Brand Males, MD   6 each at 12/12/18 1035  . cloNIDine (CATAPRES) tablet 0.1 mg  0.1 mg Oral QHS PRN Bowser,  Laurel Dimmer, NP   0.1 mg at 12/05/18 2156  . diphenhydrAMINE (BENADRYL) 12.5 MG/5ML elixir 25 mg  25 mg Oral QHS PRN Judd Lien, MD   25 mg at 12/04/18 2204  . enoxaparin (LOVENOX) injection 40 mg  40 mg Subcutaneous Q24H Bowser, Grace E, NP   40 mg at 12/12/18 1035  . feeding supplement (JEVITY 1.2 CAL) liquid 1,000 mL  1,000 mL Per Tube Continuous Rise Patience, MD   Stopped at 12/12/18 0800  . feeding supplement (PRO-STAT SUGAR FREE 64) liquid 30 mL  30 mL Per Tube Daily [place] Eric J, DO   30 mL at 12/12/18 1035   . fentaNYL (SUBLIMAZE) injection 25-50 mcg  25-50 mcg Intravenous Q4H PRN Anders Simmonds, MD   50 mcg at 12/12/18 1034  . finasteride (PROSCAR) tablet 5 mg  5 mg Oral Daily Brand Males, MD   5 mg at 12/12/18 1035  . guaiFENesin-dextromethorphan (ROBITUSSIN DM) 100-10 MG/5ML syrup 15 mL  15 mL Per Tube Q4H PRN Anders Simmonds, MD   15 mL at 12/12/18 1033  . ipratropium-albuterol (DUONEB) 0.5-2.5 (3) MG/3ML nebulizer solution 3 mL  3 mL Nebulization BID [place] Donnamarie Poag, DO   3 mL at 12/12/18 0741  . MEDLINE mouth rinse  15 mL Mouth Rinse 10 times per day Lavina Hamman, MD   15 mL at 12/12/18 1327  . morphine 2 MG/ML injection 1 mg  1 mg Intravenous Q3H PRN [place] Eric J, DO   1 mg at 12/12/18 3888  . nitroGLYCERIN (NITROSTAT) SL tablet 0.4 mg  0.4 mg Sublingual Q5 min PRN Rise Patience, MD   0.4 mg at 12/12/18 2800  . ondansetron (ZOFRAN) tablet 4 mg  4 mg Oral Q6H PRN Rise Patience, MD       Or  . ondansetron [facility]) injection 4 mg  4 mg Intravenous Q6H PRN Rise Patience, MD   4 mg at 12/04/18 2029  . pantoprazole sodium (PROTONIX) 40 mg/20 mL oral suspension 40 mg  40 mg Per Tube Daily [place] Eric J, DO   40 mg at 12/12/18 1036     Discharge Medications: Please see discharge summary for a list of discharge medications.  Relevant Imaging Results:  Relevant Lab Results:   Additional Information SSN: [id number]  [place], LCSWA

## 2018-12-12 NOTE — Progress Notes (Signed)
Patient appears to be anxious and is refusing ativan at this time.

## 2018-12-12 NOTE — Progress Notes (Signed)
PROGRESS NOTE    Robert Pham  RXV:[id number] DOB: 02-15-1951 DOA: 11/28/2018 PCP: Robert Roger, MD  Brief Narrative: 68 y.o.malewithhistory of COPD with trach PEG and vent dependent presently on transplant list at [facility], hypertension presents to the ER with complaints of increasing shortness of breath over the last 3 days and has been having increasing bloody discharge over the last 1 to 2 weeks. Denies any chest pain fever chills nausea vomiting abdominal pain or diarrhea. In addition patient has noticed increasing swelling of the lower extremities bilaterally over the last few days.  In the ER patient was afebrile chest x-ray was not showing anything acute. Had some bloody discharge from the trach area. On-call pulmonary critical care was consulted and patient placed on nebulizer treatment IV steroids for COPD exacerbation. Patient's right upper extremity PICC line area looks slightly red and erythematous for which PICC line was removed and placed on empiric antibiotics after blood cultures obtained. Per report patient had recent Candida UTI and also had Pseudomonas pneumonia. Patient also was given Lasix 20 mg IV in the ER for lower extremity edema. Patient's labs show creatinine of 0.5 sodium 140 potassium 4.7 BNP 75.2 hemoglobin 10.3 platelets 460. EKG sinus tachycardia  Recap last 7 days I picked up his care 12/07/2018 he has been in the hospital since 11/29/2018.  He is admitted from [facility].  He does not want to go back to [facility].  He and his family are looking for a place in [place] who can accept him.  On and off patient has been having issues with Xanax Ativan and fentanyl.  He always requested fentanyl.  He told me that Xanax does not work for him and does not want to take it and he only wants Ativan.  So I stop the Xanax and started him on as needed Ativan which she is refusing to take.  He has periods of anxiety.  He was also found to have a left foot drop for more than 2  weeks per staff.  He complains of left foot pain swelling and not able to flex and extend his left ankle.  I have asked OT to work with him.  He remains on the trach 40% FiO2.  Speech therapy following has PMVand is able to speak now.  I have decreased his tube feeds to nighttime only since he is starting to take p.o.  Assessment & Plan:   Principal Problem:   Acute respiratory failure with hypoxia (HCC) Active Problems:   COPD exacerbation (HCC)   Essential hypertension   Pressure injury of skin   Acute on chronic respiratory failure with hypoxia COPD exacerbation Trach and vent dependence Patient presenting from [facility] with progressive shortness of breath. Patient was admitted to the intensive care unit and placed on full vent support. Respiratory viral panel and COVID-19 test were both negative. Patient was started on scheduled nebulized treatments and completed a 5-day course of IV steroids and antibiotics with cefepime. Blood cultures 1 out of 4 were positive for coagulase-negative Staphylococcus which was likely contaminant. Patient did well and was transitioned off of vent support back to trach collar. --Continue trach collar100% still on40% FiO2.  Vent PRN.  --Unfortunately patient does not want to return back to [facility]; and SNF require uncuffed trach with maximum FiO2 of 28% and trach would also have to be 12 days old prior to dc per CM. --Patient states working with family to see if there are other options in the [place] area --  Continue respiratory therapy with nebs and suctioning as needed --Speech therapyadvanced diet to thin liquids and puree since he tolerated deflation and pmv-Chest x-ray 12/08/2018 no new infiltrates -Changing tube feeds to nighttime only so he can eat during the day.  Essential hypertensionblood pressure running soft to DC lisinopril.  Anemia Hemoglobin9.9; Stable  BLT:[id number] 5 mg p.o. daily  TMA:[id number]  statin  Anxiety: I will put him back on Xanax and DC Ativan patient refuses clonidine I will DC that.    GERD:Continue PPI  Left foot drop needs ongoing PT/ot check x-rays  DVT prophylaxis:Lovenox Code Status:Full code Family Communication: Discussed with son Robert Pham expecting case management or social worker to talk to him today to find a place closer to home in [place]. Disposition Plan:Await LTAC placement versus SNF  Consultants:  PCCM -signed off 5/29  Procedures: Transthoracic echocardiogram 11/29/2018: IMPRESSIONS 1. The left ventricle has normal systolic function with an ejection fraction of 60-65%. The cavity size was normal. Left ventricular diastolic Doppler parameters are consistent with impaired relaxation. No evidence of left ventricular regional wall  motion abnormalities. 2. The right ventricle has normal systolic function. The cavity was mildly enlarged. There is no increase in right ventricular wall thickness. Right ventricular systolic pressure could not be assessed. 3. Trivial pericardial effusion is present. 4. The aortic root is normal in size and structure.  Vascular ultrasound lower extremities: Summary: Right: There is no evidence of deep vein thrombosis in the lower extremity. No cystic structure found in the popliteal fossa. Left: There is no evidence of deep vein thrombosis in the lower extremity. No cystic structure found in the popliteal fossa.  Antimicrobials:   Cefepime 5/26 - 5/29  Vancomycin 5/26 - 5/2   Pressure Injury 11/29/18 Stage I -  Intact skin with non-blanchable redness of a localized area usually over a bony prominence. 9cm x 7cm reddened area to sacrum (Active)  11/29/18 1054  Location: Sacrum  Location Orientation: Posterior  Staging: Stage I -  Intact skin with non-blanchable redness of a localized area usually over a bony prominence.  Wound Description (Comments): 9cm x 7cm reddened area to sacrum  Present  on Admission: Yes      Nutrition Problem: Inadequate oral intake Etiology: (chronic trach and vent dependent)     Signs/Symptoms: NPO status    Interventions: Tube feeding, Refer to RD note for recommendations  Estimated body mass index is 19.81 kg/m as calculated from the following:   Height as of this encounter: _0  (1.651 m).   Weight as of this encounter: 54 kg.  Subjective: Lots of concerns wants to see case manager does not want to take ativan c/o left foot pain   Objective: Vitals:   12/12/18 0742 12/12/18 0800 12/12/18 0823 12/12/18 0830  BP:  (!) 150/84 (!) 154/95 109/86  Pulse: 99 (!) 102    Resp: (!) 28 19 (!) 27 (!) 32  Temp:      TempSrc:      SpO2: 96%  95% 91%  Weight:      Height:        Intake/Output Summary (Last 24 hours) at 12/12/2018 0917 Last data filed at 12/12/2018 0024 Gross per 24 hour  Intake --  Output 875 ml  Net -875 ml   Filed Weights   12/07/18 0620 12/08/18 0418 12/10/18 0320  Weight: 56.1 kg 52.6 kg 54 kg    Examination:  General exam: Appears calm and comfortable  Respiratory system: Clear to auscultation. Respiratory effort normal.  Cardiovascular system: S1 & S2 heard, RRR. No JVD, murmurs, rubs, gallops or clicks. No pedal edema. Gastrointestinal system: Abdomen is nondistended, soft and nontender. No organomegaly or masses felt. Normal bowel sounds heard. Central nervous system: Alert and oriented. No focal neurological deficits. Extremities:left foot swollen more than the right Skin: No rashes, lesions or ulcers Psychiatry: Judgement and insight appear normal. Mood & affect appropriate.     Data Reviewed: I have personally reviewed following labs and imaging studies  CBC: Recent Labs  Lab 12/07/18 0419  WBC 9.3  HGB 9.9*  HCT 33.4*  MCV 88.8  PLT 161   Basic Metabolic Panel: Recent Labs  Lab 12/07/18 0419  NA 137  K 3.9  CL 100  CO2 30  GLUCOSE 114*  BUN 18  CREATININE 0.53*  CALCIUM 9.4  MG  2.0   GFR: Estimated Creatinine Clearance: 67.5 mL/min (A) (by C-G formula based on SCr of 0.53 mg/dL (L)). Liver Function Tests: No results for input(s): AST, ALT, ALKPHOS, BILITOT, PROT, ALBUMIN in the last 168 hours. No results for input(s): LIPASE, AMYLASE in the last 168 hours. No results for input(s): AMMONIA in the last 168 hours. Coagulation Profile: No results for input(s): INR, PROTIME in the last 168 hours. Cardiac Enzymes: No results for input(s): CKTOTAL, CKMB, CKMBINDEX, TROPONINI in the last 168 hours. BNP (last 3 results) No results for input(s): PROBNP in the last 8760 hours. HbA1C: No results for input(s): HGBA1C in the last 72 hours. CBG: Recent Labs  Lab 12/11/18 1713 12/11/18 1947 12/11/18 2343 12/12/18 0323 12/12/18 0829  GLUCAP 114* 119* 77 126* 128*   Lipid Profile: No results for input(s): CHOL, HDL, LDLCALC, TRIG, CHOLHDL, LDLDIRECT in the last 72 hours. Thyroid Function Tests: No results for input(s): TSH, T4TOTAL, FREET4, T3FREE, THYROIDAB in the last 72 hours. Anemia Panel: No results for input(s): VITAMINB12, FOLATE, FERRITIN, TIBC, IRON, RETICCTPCT in the last 72 hours. Sepsis Labs: No results for input(s): PROCALCITON, LATICACIDVEN in the last 168 hours.  No results found for this or any previous visit (from the past 240 hour(s)).       Radiology Studies: Dg Chest Port 1 View  Result Date: 12/11/2018 CLINICAL DATA:  Shortness of breath EXAM: PORTABLE CHEST 1 VIEW COMPARISON:  December 08, 2018. FINDINGS: Tracheostomy catheter tip is 7.5 cm above the carina. No pneumothorax. Lungs are hyperexpanded with scattered areas of scarring bilaterally. There is apparent bullous disease in the upper lobes. There is no frank edema or consolidation. The heart size is normal. The pulmonary vascularity reflects underlying emphysematous change with diminished upper lobe vascularity, stable. No adenopathy. There is aortic atherosclerosis. No bone lesions.  IMPRESSION: Tracheostomy as described. 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 No appreciable edema or consolidation. Stable cardiac silhouette. There is aortic atherosclerosis. Electronically Signed   By: Lowella Grip III M.D.   On: 12/11/2018 08:46        Scheduled Meds:  atorvastatin  20 mg Oral q1800   chlorhexidine gluconate (MEDLINE KIT)  15 mL Mouth Rinse BID   Chlorhexidine Gluconate Cloth  6 each Topical Daily   enoxaparin (LOVENOX) injection  40 mg Subcutaneous Q24H   feeding supplement (PRO-STAT SUGAR FREE 64)  30 mL Per Tube Daily   finasteride  5 mg Oral Daily   ipratropium-albuterol  3 mL Nebulization BID   mouth rinse  15 mL Mouth Rinse 10 times per day   pantoprazole sodium  40 mg Per Tube  Daily   Continuous Infusions:  feeding supplement (JEVITY 1.2 CAL) 1,000 mL (12/12/18 0140)     LOS: 13 days     Georgette Shell, MD [facility]  If 7PM-7AM, please contact night-coverage [email or web address] Password [facility] 12/12/2018, 9:17 AM

## 2018-12-12 NOTE — TOC Progression Note (Signed)
Transition of Care [facility]) - Progression Note    Patient Details  Name: Cleve Paolillo MRN: [id number] Date of Birth: 17-Nov-1950  Transition of Care [facility]) CM/SW [place], [place] Phone Number: 12/12/2018, 6:03 PM  Clinical Narrative:     CSW called and spoke with the patient's sons [name] and Lanny Hurst. [name] lives with his father in [place], [place]. There are not any SNF's in [place] so his preferences along with his brothers would be close to [place]. CSW also received permission to fax the patient out to [facility] because he receives care in [place], [place] as well. CSW explained the SNF process and answered any questions both sons had.   CSW faxed the patient out to [facility] in [place]. CSW also faxed the patient out to [place] and the [facility] in [place] will continue to follow and assist with disposition.     Expected Discharge Plan: [place] Barriers to Discharge: Continued Medical Work up  Expected Discharge Plan and Services Expected Discharge Plan: [place] In-house Referral: Clinical Social Work Discharge Planning Services: NA Post Acute Care Choice: [place] Living arrangements for the past 2 months: [place] Expected Discharge Date: 12/06/18               DME Arranged: N/A         HH Arranged: NA           Social Determinants of Health (SDOH) Interventions    Readmission Risk Interventions No flowsheet data found.

## 2018-12-13 LAB — GLUCOSE, CAPILLARY
Glucose-Capillary: 101 mg/dL — ABNORMAL HIGH (ref 70–99)
Glucose-Capillary: 101 mg/dL — ABNORMAL HIGH (ref 70–99)
Glucose-Capillary: 140 mg/dL — ABNORMAL HIGH (ref 70–99)
Glucose-Capillary: 77 mg/dL (ref 70–99)
Glucose-Capillary: 81 mg/dL (ref 70–99)
Glucose-Capillary: 91 mg/dL (ref 70–99)

## 2018-12-13 MED ORDER — FUROSEMIDE 10 MG/ML IJ SOLN
20.0000 mg | Freq: Once | INTRAMUSCULAR | Status: AC
  Administered 2018-12-13: 20 mg via INTRAVENOUS
  Filled 2018-12-13: qty 2

## 2018-12-13 NOTE — Progress Notes (Signed)
PROGRESS NOTE    Robert Pham  OIB:[id number] DOB: 02-Apr-1951 DOA: 11/28/2018 PCP: Townsend Roger, MD  Brief Narrative:67 y.o.malewithhistory of COPD with trach PEG and vent dependent presently on transplant list at [facility], hypertension presents to the ER with complaints of increasing shortness of breath over the last 3 days and has been having increasing bloody discharge over the last 1 to 2 weeks. Denies any chest pain fever chills nausea vomiting abdominal pain or diarrhea. In addition patient has noticed increasing swelling of the lower extremities bilaterally over the last few days.  In the ER patient was afebrile chest x-ray was not showing anything acute. Had some bloody discharge from the trach area. On-call pulmonary critical care was consulted and patient placed on nebulizer treatment IV steroids for COPD exacerbation. Patient's right upper extremity PICC line area looks slightly red and erythematous for which PICC line was removed and placed on empiric antibiotics after blood cultures obtained. Per report patient had recent Candida UTI and also had Pseudomonas pneumonia. Patient also was given Lasix 20 mg IV in the ER for lower extremity edema. Patient's labs show creatinine of 0.5 sodium 140 potassium 4.7 BNP 75.2 hemoglobin 10.3 platelets 460. EKG sinus tachycardia  Recap last 7 days I picked up his care 12/07/2018 he has been in the hospital since 11/29/2018.  He is admitted from [place].  He does not want to go back to [facility].  He and his family are looking for a place in [place] who can accept him.  On and off patient has been having issues with Xanax Ativan and fentanyl.  He always requested fentanyl.  He told me that Xanax does not work for him and does not want to take it and he only wants Ativan.  So I stop the Xanax and started him on as needed Ativan which she is refusing to take.  He has periods of anxiety.  He was also found to have a left foot drop for more than 2  weeks per staff.  He complains of left foot pain swelling and not able to flex and extend his left ankle.  I have asked OT to work with him.  He remains on the trach 40% FiO2.  Speech therapy following has PMVand is able to speak now.  I have decreased his tube feeds to nighttime only since he is starting to take p.o.   Assessment & Plan:   Principal Problem:   Acute respiratory failure with hypoxia (HCC) Active Problems:   COPD exacerbation (HCC)   Essential hypertension   Pressure injury of skin   Acute on chronic respiratory failure with hypoxia COPD exacerbation Trach and vent dependence Patient presenting from [facility] with progressive shortness of breath. Patient was admitted to the intensive care unit and placed on full vent support. Respiratory viral panel and COVID-19 test were both negative. Patient was started on scheduled nebulized treatments and completed a 5-day course of IV steroids and antibiotics with cefepime. Blood cultures 1 out of 4 were positive for coagulase-negative Staphylococcus which was likely contaminant. Patient did well and was transitioned off of vent support back to trach collar. --Continue trach collar100% still on40% FiO2.  Vent PRN.  --Unfortunately patient does not want to return back to [facility]; and SNF require uncuffed trach with maximum FiO2 of 28% and trach would also have to be 28 days old prior to dc per CM. --Patient states working with family to see if there are other options in the [place] area --  Continue respiratory therapy with nebs and suctioning as needed --Speech therapyadvanced diet to thin liquids and puree since he tolerated deflation and pmv-Chest x-ray 12/08/2018 no new infiltrates -Changing tube feeds to nighttime only so he can eat during the day.  Essential hypertensionblood pressure running soft to DC lisinopril.  Anemia Hemoglobin9.9; Stable  AGT:XMIWOEHOZYY 5 mg p.o. daily  QMG:NOIBBCWU statin    Anxiety: I will put him back on Xanax and DC Ativan patient refuses clonidine I will DC that.    GERD:Continue PPI  Left foot drop  patient unable to plantar flexion and extension.  Consulted PT.  He has 2+ lower extremity edema and is requesting for Lasix.  I will give him 1 dose of Lasix and have Ace wrap to both lower extremities.  X-ray shows no evidence of acute findings.    DVT prophylaxis:Lovenox Code Status:Full code Family Communication: Discussed with son Faylene Million working on finding him a place closer to [employer].  Disposition Plan:Await LTAC placement versus SNF  Consultants:  PCCM -signed off 5/29  Procedures: Transthoracic echocardiogram 11/29/2018: IMPRESSIONS 1. The left ventricle has normal systolic function with an ejection fraction of 60-65%. The cavity size was normal. Left ventricular diastolic Doppler parameters are consistent with impaired relaxation. No evidence of left ventricular regional wall  motion abnormalities. 2. The right ventricle has normal systolic function. The cavity was mildly enlarged. There is no increase in right ventricular wall thickness. Right ventricular systolic pressure could not be assessed. 3. Trivial pericardial effusion is present. 4. The aortic root is normal in size and structure.  Vascular ultrasound lower extremities: Summary: Right: There is no evidence of deep vein thrombosis in the lower extremity. No cystic structure found in the popliteal fossa. Left: There is no evidence of deep vein thrombosis in the lower extremity. No cystic structure found in the popliteal fossa.  Antimicrobials:   Cefepime 5/26 - 5/29  Vancomycin 5/26 - 5/2    Pressure Injury 11/29/18 Stage I -  Intact skin with non-blanchable redness of a localized area usually over a bony prominence. 9cm x 7cm reddened area to sacrum (Active)  11/29/18 1054  Location: Sacrum  Location Orientation: Posterior  Staging: Stage I -  Intact  skin with non-blanchable redness of a localized area usually over a bony prominence.  Wound Description (Comments): 9cm x 7cm reddened area to sacrum  Present on Admission: Yes      Nutrition Problem: Inadequate oral intake Etiology: (chronic trach and vent dependent)     Signs/Symptoms: NPO status    Interventions: Tube feeding  Estimated body mass index is 19.81 kg/m as calculated from the following:   Height as of this encounter: '5\' 5"'$  (1.651 m).   Weight as of this encounter: 54 kg.   Subjective: Sitting up in chair concerned about swelling in his legs breathing better a lot of anxiety  Objective: Vitals:   12/13/18 0329 12/13/18 0418 12/13/18 0739 12/13/18 0811  BP:  130/82 130/82 120/88  Pulse: 64 72 86 90  Resp: 20 (!) 22 (!) 23 (!) 24  Temp:  97.7 F (36.5 C)  98.7 F (37.1 C)  TempSrc:  Oral    SpO2: 99% 91% 92% 97%  Weight:      Height:        Intake/Output Summary (Last 24 hours) at 12/13/2018 1120 Last data filed at 12/13/2018 0400 Gross per 24 hour  Intake 480 ml  Output 700 ml  Net -220 ml   [employer]  12/07/18 0620 12/08/18 0418 12/10/18 0320  Weight: 56.1 kg 52.6 kg 54 kg    Examination:  General exam: Appears calm and comfortable  Respiratory system: Clear to auscultation. Respiratory effort normal. Cardiovascular system: S1 & S2 heard, RRR. No JVD, murmurs, rubs, gallops or clicks. No pedal edema. Gastrointestinal system: Abdomen is nondistended, soft and nontender. No organomegaly or masses felt. Normal bowel sounds heard. Central nervous system: Alert and oriented. No focal neurological deficits. Extremities: 2+ pitting edema left more than right unable to do pain plantar and dorsiflexion to the left ankle Skin: No rashes, lesions or ulcers Psychiatry: Judgement and insight appear normal. Mood & affect appropriate.     Data Reviewed: I have personally reviewed following labs and imaging studies  CBC: Recent Labs  Lab  12/07/18 0419 12/12/18 0912  WBC 9.3 11.2*  HGB 9.9* 10.0*  HCT 33.4* 33.2*  MCV 88.8 88.5  PLT 366 417   Basic Metabolic Panel: Recent Labs  Lab 12/07/18 0419 12/12/18 0912  NA 137 138  K 3.9 3.9  CL 100 100  CO2 30 29  GLUCOSE 114* 117*  BUN 18 17  CREATININE 0.53* 0.54*  CALCIUM 9.4 10.0  MG 2.0  --    GFR: Estimated Creatinine Clearance: 67.5 mL/min (A) (by C-G formula based on SCr of 0.54 mg/dL (L)). Liver Function Tests: Recent Labs  Lab 12/12/18 0912  AST 19  ALT 22  ALKPHOS 78  BILITOT 0.3  PROT 6.7  ALBUMIN 3.4*   No results for input(s): LIPASE, AMYLASE in the last 168 hours. No results for input(s): AMMONIA in the last 168 hours. Coagulation Profile: No results for input(s): INR, PROTIME in the last 168 hours. Cardiac Enzymes: No results for input(s): CKTOTAL, CKMB, CKMBINDEX, TROPONINI in the last 168 hours. BNP (last 3 results) No results for input(s): PROBNP in the last 8760 hours. HbA1C: No results for input(s): HGBA1C in the last 72 hours. CBG: Recent Labs  Lab 12/12/18 1636 12/12/18 2006 12/12/18 2351 12/13/18 0413 12/13/18 0711  GLUCAP 82 99 147* 101* 101*   Lipid Profile: No results for input(s): CHOL, HDL, LDLCALC, TRIG, CHOLHDL, LDLDIRECT in the last 72 hours. Thyroid Function Tests: No results for input(s): TSH, T4TOTAL, FREET4, T3FREE, THYROIDAB in the last 72 hours. Anemia Panel: No results for input(s): VITAMINB12, FOLATE, FERRITIN, TIBC, IRON, RETICCTPCT in the last 72 hours. Sepsis Labs: No results for input(s): PROCALCITON, LATICACIDVEN in the last 168 hours.  No results found for this or any previous visit (from the past 240 hour(s)).       Radiology Studies: Dg Foot 2 Views Left  Result Date: 12/12/2018 CLINICAL DATA:  Swelling to top of foot EXAM: LEFT FOOT - 2 VIEW COMPARISON:  None. FINDINGS: Soft tissue swelling along the dorsum of the foot overlying the metatarsals. No acute bony abnormality. Specifically, no  fracture, subluxation, or dislocation. Early joint space narrowing in the tarsal metatarsal joints and 1st MTP joint. IMPRESSION: No acute bony abnormality. Electronically Signed   By: Rolm Baptise M.D.   On: 12/12/2018 10:38        Scheduled Meds: . ALPRAZolam  0.5 mg Oral TID  . atorvastatin  20 mg Oral q1800  . chlorhexidine gluconate (MEDLINE KIT)  15 mL Mouth Rinse BID  . Chlorhexidine Gluconate Cloth  6 each Topical Daily  . enoxaparin (LOVENOX) injection  40 mg Subcutaneous Q24H  . feeding supplement (PRO-STAT SUGAR FREE 64)  30 mL Per Tube Daily  . finasteride  5  mg Oral Daily  . ipratropium-albuterol  3 mL Nebulization BID  . mouth rinse  15 mL Mouth Rinse 10 times per day  . pantoprazole sodium  40 mg Per Tube Daily   Continuous Infusions: . feeding supplement (JEVITY 1.2 CAL) Stopped (12/12/18 0800)     LOS: 14 days     Georgette Shell, MD [facility]  If 7PM-7AM, please contact night-coverage [email or web address] Password [email or web address] 12/13/2018, 11:20 AM

## 2018-12-13 NOTE — Progress Notes (Signed)
Orthopedic Tech Progress Note Patient Details:  Robert Pham 01/15/1951 [id number] Came down to apply ace wrap to patient legs and he said the RN "told him that she would apply the ace wrap". I looked for her to confirm but never saw RN Patient ID: Robert Pham, male   DOB: 1950/12/27, 68 y.o.   MRN: [id number]   Robert Pham 12/13/2018, 4:02 PM

## 2018-12-13 NOTE — Progress Notes (Signed)
Physical Therapy Treatment Patient Details Name: Robert Pham MRN: [id number] DOB: 1950-08-23 Today's Date: 12/13/2018    History of Present Illness 68 y.o. 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 Patient admitted from [place], on chronic trach and vent dependent. Pt is nonverbal and unable to give history. Per chart review, trach was most likely placed sometime in March 2020.    PT Comments    Pt admitted with above diagnosis. Pt currently with functional limitations due to balance and endurance deficits. Pt met 0/4 goals set previously as he has had multiple setbacks due to anxiety and issues with desat with high level of FiO2.  Revised goals today.  Pt continues to participate with PT however was on higher level of FiO2 at rest today - 60% with PMV in place and can only tolerate a short distance ambulation and takes 2 min to recover Sats once back.  Pt also exhausted after walk.  Will continue to progress pt as able. Pt will benefit from skilled PT to increase their independence and safety with mobility to allow discharge to the venue listed below.     Follow Up Recommendations  SNF;Supervision/Assistance - 24 hour     Equipment Recommendations  None recommended by PT    Recommendations for Other Services       Precautions / Restrictions Precautions Precautions: Fall Restrictions Weight Bearing Restrictions: No    Mobility  Bed Mobility               General bed mobility comments: in chair on arrival  Transfers Overall transfer level: Needs assistance Equipment used: Rolling walker (2 wheeled) Transfers: Sit to/from Stand Sit to Stand: Min guard Stand pivot transfers: Min guard       General transfer comment: Impulsive therefore  needs cues for safety.   Ambulation/Gait Ambulation/Gait assistance: Min guard Gait Distance (Feet): 25 Feet Assistive device: Rolling walker (2 wheeled) Gait Pattern/deviations: Step-through pattern;Decreased stride length;Trunk flexed   Gait velocity interpretation: 1.31 - 2.62 ft/sec, indicative of limited community ambulator General Gait Details: On arrival, pt had PMV in place and FiO2 was 60%.  Pt dyspnea at rest 2/4.  Sats were 92-94%.  Placed on 55% FiO2 to tank as that is highest level on portable trach collar.  Pt dropped to 80% with the walk to hallway with DOE 3/4 and pt had to turn around at door and go back to chair with DOE 4/4 by the time pt back to chair.  Took 2 min for sats to get back to 90%.  Pt fatigued and stated he can't do anymore. Pt placed back on the 60% FiO2 at end of treatment. Pt safe with RW overall with the walk wihtout LOB.  Endurance limits pt significantly.    Stairs             Wheelchair Mobility    Modified Rankin (Stroke Patients Only)       Balance Overall balance assessment: Needs assistance Sitting-balance support: No upper extremity supported;Feet supported Sitting balance-Leahy Scale: Good     Standing balance support: No upper extremity supported;During functional activity Standing balance-Leahy Scale: Fair Standing balance comment: can stand staticaly without UE support  Cognition Arousal/Alertness: Awake/alert Behavior During Therapy: Impulsive;Anxious;Agitated Overall Cognitive Status: Within Functional Limits for tasks assessed                                 General Comments: Wants to move fast      Exercises General Exercises - Lower Extremity Long Arc Quad: AROM;Both;10 reps;Seated Hip Flexion/Marching: AROM;Both;10 reps;Seated    General Comments        Pertinent Vitals/Pain Pain Assessment: No/denies pain    Home Living                       Prior Function            PT Goals (current goals can now be found in the care plan section) Acute Rehab PT Goals Patient Stated Goal: to go home PT Goal Formulation: With patient Time For Goal Achievement: 12/27/18 Potential to Achieve Goals: Good Progress towards PT goals: Progressing toward goals    Frequency    Min 3X/week      PT Plan Current plan remains appropriate    Co-evaluation              AM-PAC PT "6 Clicks" Mobility   Outcome Measure  Help needed turning from your back to your side while in a flat bed without using bedrails?: None Help needed moving from lying on your back to sitting on the side of a flat bed without using bedrails?: None Help needed moving to and from a bed to a chair (including a wheelchair)?: None Help needed standing up from a chair using your arms (e.g., wheelchair or bedside chair)?: A Little Help needed to walk in hospital room?: A Little Help needed climbing 3-5 steps with a railing? : A Little 6 Click Score: 21    End of Session Equipment Utilized During Treatment: Gait belt;Oxygen(55% O2 during amb via trach) Activity Tolerance: Patient limited by fatigue Patient left: in chair;with call bell/phone within reach Nurse Communication: Mobility status PT Visit Diagnosis: Unsteadiness on feet (R26.81);Muscle weakness (generalized) (M62.81)     Time: 0211-1735 PT Time Calculation (min) (ACUTE ONLY): 20 min  Charges:  $Gait Training: 8-22 mins                     [place] Pager:  [phone]  Office:  [place] 12/13/2018, 12:51 PM

## 2018-12-13 NOTE — Progress Notes (Addendum)
  Speech Language Pathology Treatment: Dysphagia;Passy Muir Speaking valve  Patient Details Name: Robert Pham MRN: [id number] DOB: 23-Dec-1950 Today's Date: 12/13/2018 Time: 3846-6599 SLP Time Calculation (min) (ACUTE ONLY): 24 min  Assessment / Plan / Recommendation Clinical Impression  Pt communicated well with PMV already in place upon arrival. He shared that he'd had it in place all day so far, and that he "even wore it while he's sleeping." Education was provided about the recommendation not to wear during sleeping. He acknowledged that he likes to wear the valve because it helps him cough better. He did not have any expectoration during session, but wore the valve without overt signs of difficulty.   He says that he has been eating fairly well but has subjective c/o getting "blocked up" during meals. He denies any coughing or changes in respiration during meals, but thinks he coughs more after the meal and is often asking to be suctioned. He is not sure if food or secretions are suctioned out from his trach. He consumed thin liquids under SLP supervision without overt signs of aspiration or changes in respirations.   Question if this could even be more esophageal symptoms as he describes a h/o GER, but given his concern for possible food coming from his trach, oropharyngeal dysphagia should also be considered. Given that he remains on a modified diet and prior to that he had not eaten since before February, recommend proceeding with MBS to better assess oropharyngeal function. From here, we can better determine aspiration risk and/or even potential to advance solids. Will plan for next date.     HPI HPI: 68 y.o. male with history of COPD with trach/PEG and vent dependent presently on transplant list at [facility], hypertension presents to the ER with complaints of increasing shortness of breath over the last 3 days and has been having increasing bloody discharge over the last 1 to 2 weeks. Patient  admitted from [place].      SLP Plan  MBS       Recommendations  Diet recommendations: Dysphagia 1 (puree);Thin liquid Liquids provided via: Cup;Straw Medication Administration: Crushed with puree Supervision: Patient able to self feed;Intermittent supervision to cue for compensatory strategies Compensations: Minimize environmental distractions;Slow rate;Small sips/bites Postural Changes and/or Swallow Maneuvers: Seated upright 90 degrees;Upright 30-60 min after meal      Patient may use Passy-Muir Speech Valve: During all waking hours (remove during sleep) PMSV Supervision: Intermittent MD: Please consider changing trach tube to : Smaller size;Cuffless         Oral Care Recommendations: Oral care BID Follow up Recommendations: Skilled Nursing facility SLP Visit Diagnosis: Aphonia (R49.1);Dysphagia, unspecified (R13.10) Plan: MBS       GO                Venita Sheffield Maddalyn Lutze 12/13/2018, 4:06 PM  Pollyann Glen, M.A. [place] Acute Environmental education officer [phone] Office [phone]

## 2018-12-13 NOTE — Progress Notes (Signed)
Pt very anxious. Initially refused scheduled Xanax but then took it. Asked for something for sleep, but refused benedryl. Constant pain to left foot, right hip; fentanyl given with effectiveness. Pt discussed how he is anxious, sleepless, afraid to go to sleep - explained that when he goes to sleep and wakes up he is confused & disoriented & frightened. Was oriented to self and situation, but not to place or time. Said that he could not breathe - sats in 83's - respiratory therapist called - but was very fearful and wished to see the RT.

## 2018-12-14 ENCOUNTER — Other Ambulatory Visit: Payer: Self-pay

## 2018-12-14 ENCOUNTER — Other Ambulatory Visit (HOSPITAL_COMMUNITY): Payer: Self-pay

## 2018-12-14 ENCOUNTER — Inpatient Hospital Stay
Admission: AD | Admit: 2018-12-14 | Discharge: 2019-01-10 | Disposition: A | Discharge status: Home Health | Payer: Medicare Other | Source: Other Acute Inpatient Hospital | Attending: Internal Medicine | Admitting: Internal Medicine

## 2018-12-14 ENCOUNTER — Inpatient Hospital Stay (HOSPITAL_COMMUNITY): Payer: Medicare Other

## 2018-12-14 DIAGNOSIS — Z931 Gastrostomy status: Secondary | ICD-10-CM

## 2018-12-14 DIAGNOSIS — J441 Chronic obstructive pulmonary disease with (acute) exacerbation: Secondary | ICD-10-CM | POA: Diagnosis present

## 2018-12-14 DIAGNOSIS — R05 Cough: Secondary | ICD-10-CM

## 2018-12-14 DIAGNOSIS — R0603 Acute respiratory distress: Secondary | ICD-10-CM

## 2018-12-14 DIAGNOSIS — Z9911 Dependence on respirator [ventilator] status: Secondary | ICD-10-CM

## 2018-12-14 DIAGNOSIS — K5649 Other impaction of intestine: Secondary | ICD-10-CM

## 2018-12-14 DIAGNOSIS — K219 Gastro-esophageal reflux disease without esophagitis: Secondary | ICD-10-CM | POA: Diagnosis present

## 2018-12-14 DIAGNOSIS — J9621 Acute and chronic respiratory failure with hypoxia: Secondary | ICD-10-CM | POA: Diagnosis present

## 2018-12-14 DIAGNOSIS — R059 Cough, unspecified: Secondary | ICD-10-CM

## 2018-12-14 DIAGNOSIS — Z431 Encounter for attention to gastrostomy: Secondary | ICD-10-CM

## 2018-12-14 DIAGNOSIS — F411 Generalized anxiety disorder: Secondary | ICD-10-CM | POA: Diagnosis present

## 2018-12-14 HISTORY — DX: Chronic obstructive pulmonary disease with (acute) exacerbation: J44.1

## 2018-12-14 HISTORY — DX: Acute and chronic respiratory failure with hypoxia: J96.21

## 2018-12-14 HISTORY — DX: Generalized anxiety disorder: F41.1

## 2018-12-14 LAB — CBC WITH DIFFERENTIAL/PLATELET
Abs Immature Granulocytes: 0.04 10*3/uL (ref 0.00–0.07)
Basophils Absolute: 0 10*3/uL (ref 0.0–0.1)
Basophils Relative: 0 %
Eosinophils Absolute: 0.3 10*3/uL (ref 0.0–0.5)
Eosinophils Relative: 2 %
HCT: 32.2 % — ABNORMAL LOW (ref 39.0–52.0)
Hemoglobin: 9.5 g/dL — ABNORMAL LOW (ref 13.0–17.0)
Immature Granulocytes: 0 %
Lymphocytes Relative: 6 %
Lymphs Abs: 0.7 10*3/uL (ref 0.7–4.0)
MCH: 25.9 pg — ABNORMAL LOW (ref 26.0–34.0)
MCHC: 29.5 g/dL — ABNORMAL LOW (ref 30.0–36.0)
MCV: 87.7 fL (ref 80.0–100.0)
Monocytes Absolute: 1.5 10*3/uL — ABNORMAL HIGH (ref 0.1–1.0)
Monocytes Relative: 13 %
Neutro Abs: 8.8 10*3/uL — ABNORMAL HIGH (ref 1.7–7.7)
Neutrophils Relative %: 79 %
Platelets: 346 10*3/uL (ref 150–400)
RBC: 3.67 MIL/uL — ABNORMAL LOW (ref 4.22–5.81)
RDW: 15.6 % — ABNORMAL HIGH (ref 11.5–15.5)
WBC: 11.3 10*3/uL — ABNORMAL HIGH (ref 4.0–10.5)
nRBC: 0 % (ref 0.0–0.2)

## 2018-12-14 LAB — GLUCOSE, CAPILLARY
Glucose-Capillary: 90 mg/dL (ref 70–99)
Glucose-Capillary: 116 mg/dL — ABNORMAL HIGH (ref 70–99)
Glucose-Capillary: 101 mg/dL — ABNORMAL HIGH (ref 70–99)
Glucose-Capillary: 90 mg/dL (ref 70–99)

## 2018-12-14 MED ORDER — QUETIAPINE FUMARATE 25 MG PO TABS
25.0000 mg | ORAL_TABLET | Freq: Two times a day (BID) | ORAL | Status: DC
  Filled 2018-12-14: qty 1

## 2018-12-14 MED ORDER — ADULT MULTIVITAMIN W/MINERALS CH: 1.0000 | ORAL_TABLET | Freq: Every day | ORAL | Status: DC

## 2018-12-14 MED ORDER — HYDROCODONE-ACETAMINOPHEN 5-325 MG PO TABS
1.0000 | ORAL_TABLET | ORAL | Status: DC | PRN
  Administered 2018-12-14: 1
  Filled 2018-12-14: qty 1

## 2018-12-14 MED ORDER — BUSPIRONE HCL 10 MG PO TABS
5.0000 mg | ORAL_TABLET | Freq: Three times a day (TID) | ORAL | Status: DC
  Filled 2018-12-14: qty 1

## 2018-12-14 MED ORDER — POLYETHYLENE GLYCOL 3350 17 G PO PACK: 17.0000 g | PACK | Freq: Every day | ORAL | Status: DC | PRN

## 2018-12-14 MED ORDER — ISOSOURCE 1.5 CAL PO LIQD: 60.0000 mL | ORAL | Status: DC

## 2018-12-14 MED ORDER — LORATADINE 10 MG PO TABS: 10.0000 mg | ORAL_TABLET | Freq: Every day | ORAL | Status: DC | PRN

## 2018-12-14 MED ORDER — BUDESONIDE 0.5 MG/2ML IN SUSP: 0.5000 mg | Freq: Two times a day (BID) | RESPIRATORY_TRACT | Status: DC

## 2018-12-14 MED ORDER — SUCRALFATE 1 G PO TABS: 1.0000 g | ORAL_TABLET | Freq: Three times a day (TID) | ORAL | Status: DC

## 2018-12-14 NOTE — Plan of Care (Signed)
  Problem: Education: Goal: Knowledge about tracheostomy care/management will improve Outcome: Progressing   Problem: Activity: Goal: Ability to tolerate increased activity will improve Outcome: Progressing   Problem: Respiratory: Goal: Patent airway maintenance will improve Outcome: Progressing   Problem: Education: Goal: Knowledge of General Education information will improve Description Including pain rating scale, medication(s)/side effects and non-pharmacologic comfort measures Outcome: Progressing   Problem: Coping: Goal: Level of anxiety will decrease Outcome: Not Progressing

## 2018-12-14 NOTE — Progress Notes (Signed)
Modified Barium Swallow Progress Note  Patient Details  Name: Robert Pham MRN: [id number] Date of Birth: 1950-11-09  Today's Date: 12/14/2018  Modified Barium Swallow completed.  Full report located under Chart Review in the Imaging Section.  Brief recommendations include the following:  Clinical Impression  [name] oropharyngeal swallow was [facility] with no aspiration and good swallow efficiency. Upon esophageal scan, the barium tablet did appear to stop in the distal esophagus, but with other boluses able to transit around it (MD not present to confirm). Will start regular diet and thin liquids, although encouraged pt to go slowly with resuming his diet and to take frequent rest breaks. Will f/u for tolerance and reinforcement of aspiration and esophageal precautions.   Swallow Evaluation Recommendations   Recommended Consults: Consider esophageal assessment   SLP Diet Recommendations: Regular solids;Thin liquid   Liquid Administration via: Cup;Straw   Medication Administration: Whole meds with liquid(crush if larger)   Supervision: Patient able to self feed;Intermittent supervision to cue for compensatory strategies   Compensations: Slow rate;Small sips/bites;Follow solids with liquid   Postural Changes: Remain semi-upright after after feeds/meals (Comment);Seated upright at 90 degrees   Oral Care Recommendations: Oral care BID   Other Recommendations: Place PMSV during PO intake    Robert Pham 12/14/2018,1:45 PM   Robert Pham, M.A. [place] Acute Environmental education officer [phone] Office [phone]

## 2018-12-14 NOTE — Progress Notes (Addendum)
NCM spoke with [place], with [facility] , she states they can take patient  Today, NCM spoke with patient about LTACH options.  He states he does not want to go to [facility].  MD notified and [place] with [facility] notified. [place] asked if could speak with his son about this , he states, he will call his son.    Patient changed his mind and decided he does want to go to [facility], he wanted NCM to contact his son Lanny Hurst and [name],  [place] callede to inform both that he will be going to [facility] today. MD notified.

## 2018-12-14 NOTE — TOC Transition Note (Signed)
Transition of Care [facility]) - CM/SW Discharge Note   Patient Details  Name: Raydon Chappuis MRN: [id number] Date of Birth: [birth year]  Transition of [facility]) CM/SW Contact:  Zenon Mayo, RN Phone Number: 12/14/2018, 7:31 PM   Clinical Narrative:    Patient discharged to [place], sons notified and MD notified.    Final next level of care: Long Term Acute Care (LTAC) Barriers to Discharge: No Barriers Identified   Patient Goals and CMS Choice Patient states their goals for this hospitalization and ongoing recovery are:: "to get to another SNF" CMS Medicare.gov Compare Post Acute Care list provided to:: Patient Choice offered to / list presented to : Patient  Discharge Placement                       Discharge Plan and Services In-house Referral: Clinical Social Work Discharge Planning Services: CM Consult Post Acute Care Choice: Long Term Acute Care (LTAC)          DME Arranged: N/A         HH Arranged: NA          Social Determinants of Health (SDOH) Interventions     Readmission Risk Interventions No flowsheet data found.

## 2018-12-14 NOTE — Progress Notes (Signed)
OT Cancellation Note  Patient Details Name: Robert Pham MRN: [id number] DOB: 1950-12-23   Cancelled Treatment:    Reason Eval/Treat Not Completed: Other (comment); pt noted somewhat anxious this afternoon, reports is agreeable to therapy session but requesting to hold until a later time; pt wanting to find out more information related to discharge ("why are they sending me to the 5th floor?"), explanation provided however pt still with questions and requesting to make a phone call. Agreeable to therapy checking back at a later time/date. Will follow up as schedule permits.  Lou Cal, OT [facility] Pager [phone] Office [phone]   [name] 12/14/2018, 2:55 PM

## 2018-12-14 NOTE — Discharge Summary (Signed)
Discharge Summary  Robert Pham ZOX:[id number] DOB: 29-Sep-1950  PCP: Crist Fat, MD  Admit date: 11/28/2018 Discharge date: 12/14/2018  Time spent: 35 minutes  Recommendations for Outpatient Follow-up:  1. Transfer to [facility]. 2. Consider esophageal assessment at [facility] or outpatient 3. Continue oral care twice daily 4. Continue to place PMSV during p.o. intake  Discharge Diagnoses:  Active Hospital Problems   Diagnosis Date Noted   Acute respiratory failure with hypoxia (HCC) 11/29/2018   Pressure injury of skin 12/07/2018   COPD exacerbation (HCC) 11/29/2018   Essential hypertension 11/29/2018    Resolved Hospital Problems  No resolved problems to display.    Discharge Condition: Stable  Diet recommendation: As recommended by speech therapist:  SLP Diet Recommendations Regular solids;Thin liquid  Liquid Administration via Cup;Straw  Medication Administration Whole meds with liquid  Compensations Slow rate;Small sips/bites;Follow solids with liquid  Postural Changes Remain semi-upright after after feeds/meals (Comment);Seated upright at 90 degrees     Vitals:   12/14/18 1130 12/14/18 1200  BP: (!) 161/91 (!) 135/96  Pulse: 99 100  Resp: (!) 22 20  Temp:  97.8 F (36.6 C)  SpO2: 97% (!) 84%    History of present illness:  PCP: Crist Fat, MD      Brief Narrative:68 y.o.malewithhistory of COPD with trach PEG and vent dependent presently on transplant list at [facility], hypertension presents to the ER with complaints of increasing shortness of breath over the last 3 days and has been having increasing bloody discharge over the last 1 to 2 weeks. Denies any chest pain fever chills nausea vomiting abdominal pain or diarrhea. In addition patient has noticed increasing swelling of the lower extremities bilaterally over the last few days.  In the ER patient was afebrile chest x-ray was not showing anything acute. Had some bloody discharge from the trach  area. On-call pulmonary critical care was consulted and patient placed on nebulizer treatment IV steroids for COPD exacerbation. Patient's right upper extremity PICC line area looks slightly red and erythematous for which PICC line was removed and placed on empiric antibiotics after blood cultures obtained. Per report patient had recent Candida UTI and also had Pseudomonas pneumonia. Patient also was given Lasix 20 mg IV in the ER for lower extremity edema. Patient's labs show creatinine of 0.5 sodium 140 potassium 4.7 BNP 75.2 hemoglobin 10.3 platelets 460. EKG sinus tachycardia  Recap last 7 days I picked up his care 12/07/2018 he has been in the hospital since 11/29/2018. He is admitted from [facility]. He does not want to go back to [facility]. He and his family are looking for a place in [place] who can accept him. On and off patient has been having issues with Xanax Ativan and fentanyl. He always requested fentanyl. He told me that Xanax does not work for him and does not want to take it and he only wants Ativan. So I stop the Xanax and started him on as needed Ativan which she is refusing to take. He has periods of anxiety. He was also found to have a left foot drop for more than 2 weeks per staff. He complains of left foot pain swelling and not able to flex and extend his left ankle. I have asked OT to work with him. He remains on the trach 40% FiO2. Speech therapy following has PMVand is able to speak now. I have decreased his tube feeds to nighttime only since he is starting to take p.o.  12/14/18: Patient seen and examined  at his bedside.  He has no new concerns.  Passed a swallow evaluation.  Trach collar in place on 10 L of O2.  On regular consistency diet and tolerating well.  He wants to leave.  We will transfer to [facility].  Hospital Course:  Principal Problem:   Acute respiratory failure with hypoxia (HCC) Active Problems:   COPD exacerbation (HCC)   Essential hypertension    Pressure injury of skin  Acute on chronic respiratory failure with hypoxia COPD exacerbation Trach and vent dependence Patient presenting from [facility] with progressive shortness of breath. Patient was admitted to the intensive care unit and placed on full vent support. Respiratory viral panel and COVID-19 test were both negative. Patient was started on scheduled nebulized treatments and completed a 5-day course of IV steroids and antibiotics with cefepime. Blood cultures 1 out of 4 were positive for coagulase-negative Staphylococcus which was likely contaminant. Patient did well and was transitioned off of vent support back to trach collar. --Continue trach collar100% still on40% FiO2. Vent PRN.  --Unfortunately patient does not want to return back to [facility]; and SNF require uncuffed trach with maximum FiO2 of 28% and trach would also have to be 16 days old prior to dc per CM. --Patient states working with family to see if there are other options in the [place] area --Continue respiratory therapy with nebs and suctioning as needed --Speech therapyadvanced diet to thin liquids and puree since he tolerated deflation and pmv-Chest x-ray 12/08/2018 no new infiltrates -Changing tube feeds to nighttime only so he can eat during the day.  Essential hypertensionblood pressure running soft to DC lisinopril.  Anemia Hemoglobin9.9; Stable  ZOX:WRUEAVWUJWJ 5 mg p.o. daily  XBJ:YNWGNFAO statin   Anxiety:I will put him back on Xanax and DC Ativan patient refuses clonidine I will DC that.   GERD:Continue PPI  Left foot drop patient unable to plantar flexion and extension.  Consulted PT.  He has 2+ lower extremity edema and is requesting for Lasix.  I will give him 1 dose of Lasix and have Ace wrap to both lower extremities.  X-ray shows no evidence of acute findings.    DVT prophylaxis:Lovenox Code Status:Full code Family Communication:Discussed with son  Emelda Brothers working on finding him a place closer to [employer].  Disposition Plan:Await LTAC placement versus SNF  Consultants:  PCCM -signed off 5/29  Procedures: Transthoracic echocardiogram 11/29/2018: IMPRESSIONS 1. The left ventricle has normal systolic function with an ejection fraction of 60-65%. The cavity size was normal. Left ventricular diastolic Doppler parameters are consistent with impaired relaxation. No evidence of left ventricular regional wall  motion abnormalities. 2. The right ventricle has normal systolic function. The cavity was mildly enlarged. There is no increase in right ventricular wall thickness. Right ventricular systolic pressure could not be assessed. 3. Trivial pericardial effusion is present. 4. The aortic root is normal in size and structure.  Vascular ultrasound lower extremities: Summary: Right: There is no evidence of deep vein thrombosis in the lower extremity. No cystic structure found in the popliteal fossa. Left: There is no evidence of deep vein thrombosis in the lower extremity. No cystic structure found in the popliteal fossa.  Antimicrobials:   Cefepime 5/26 - 5/29  Vancomycin 5/26 - 5/2    Pressure Injury 11/29/18 Stage I -  Intact skin with non-blanchable redness of a localized area usually over a bony prominence. 9cm x 7cm reddened area to sacrum (Active)  11/29/18 1054  Location: Sacrum  Location Orientation: Posterior  Staging: Stage I -  Intact skin with non-blanchable redness of a localized area usually over a bony prominence.  Wound Description (Comments): 9cm x 7cm reddened area to sacrum  Present on Admission: Yes      Nutrition Problem: Inadequate oral intake Etiology: (chronic trach and vent dependent)     Signs/Symptoms: NPO status    Interventions: Tube feeding    Discharge Exam: BP (!) 135/96    Pulse 100    Temp 97.8 F (36.6 C)    Resp 20    Ht 5\' 5"  (1.651 m)    Wt 54 kg     SpO2 (!) 84%    BMI 19.81 kg/m   General: 68 y.o. year-old male chronically ill-appearing in no acute distress.  Alert and oriented x3.  Cardiovascular: Regular rate and rhythm with no rubs or gallops.  No thyromegaly or JVD noted.    Respiratory: Mild rales at bases with no wheezes. Good inspiratory effort.  Trach collar in place.  Musculoskeletal: No lower extremity edema. 2/4 pulses in all 4 extremities.  Psychiatry: Mood is appropriate for condition and setting  Discharge Instructions You were cared for by a hospitalist during your hospital stay. If you have any questions about your discharge medications or the care you received while you were in the hospital after you are discharged, you can call the unit and asked to speak with the hospitalist on call if the hospitalist that took care of you is not available. Once you are discharged, your primary care physician will handle any further medical issues. Please note that NO REFILLS for any discharge medications will be authorized once you are discharged, as it is imperative that you return to your primary care physician (or establish a relationship with a primary care physician if you do not have one) for your aftercare needs so that they can reassess your need for medications and monitor your lab values.   Allergies as of 12/14/2018      Reactions   Codeine    Klonopin [clonazepam]       Medication List    STOP taking these medications   acetaminophen 325 MG tablet Commonly known as:  TYLENOL   ceFEPIme 1 g in sodium chloride 0.9 % 100 mL   diltiazem 30 MG tablet Commonly known as:  CARDIZEM   HYDROcodone-acetaminophen 5-325 MG tablet Commonly known as:  NORCO/VICODIN   morphine 15 MG tablet Commonly known as:  MSIR   vancomycin 1,000 mg in sodium chloride 0.9 % 250 mL     TAKE these medications   aspirin 81 MG chewable tablet Place 81 mg into feeding tube daily.   atorvastatin 20 MG tablet Commonly known as:   LIPITOR Place 20 mg into feeding tube at bedtime.   budesonide 0.5 MG/2ML nebulizer solution Commonly known as:  PULMICORT Take 0.5 mg by nebulization every 12 (twelve) hours.   busPIRone 5 MG tablet Commonly known as:  BUSPAR Place 5 mg into feeding tube every 8 (eight) hours.   chlorhexidine 0.12 % solution Commonly known as:  PERIDEX Use as directed 5 mLs in the mouth or throat 2 (two) times daily.   famotidine 20 MG tablet Commonly known as:  PEPCID Place 20 mg into feeding tube at bedtime.   finasteride 5 MG tablet Commonly known as:  PROSCAR Take 5 mg by mouth daily. Per G-Tube   guaiFENesin 100 MG/5ML liquid Commonly known as:  ROBITUSSIN Place 200 mg into feeding tube every 6 (six) hours.  ipratropium-albuterol 0.5-2.5 (3) MG/3ML Soln Commonly known as:  DUONEB Take 3 mLs by nebulization every 6 (six) hours.   ipratropium-albuterol 0.5-2.5 (3) MG/3ML Soln Commonly known as:  DUONEB Take 3 mLs by nebulization as needed (Therapeutic range).   Isosource 1.5 Cal Liqd Place 60 mLs into feeding tube See admin instructions. By shift starting   lansoprazole 30 MG capsule Commonly known as:  PREVACID Place 30 mg into feeding tube daily.   loratadine 10 MG tablet Commonly known as:  CLARITIN Place 10 mg into feeding tube daily as needed for allergies.   multivitamin with minerals Tabs tablet Place 1 tablet into feeding tube daily.   polyethylene glycol 17 g packet Commonly known as:  MIRALAX / GLYCOLAX Place 17 g into feeding tube daily as needed for mild constipation, moderate constipation or severe constipation.   QUEtiapine 25 MG tablet Commonly known as:  SEROQUEL Place 25 mg into feeding tube 2 (two) times daily.   sucralfate 1 g tablet Commonly known as:  CARAFATE Take 1 g by mouth 4 (four) times daily - after meals and at bedtime.      Allergies  Allergen Reactions   Codeine    Klonopin [Clonazepam]    Follow-up Information    Crist FatVan Eyk,  Jason, MD. Call in 1 day(s).   Specialty:  Internal Medicine Why:  Please call for a post hospital follow up appointment Contact information: [address] [place] [address] [phone]            The results of significant diagnostics from this hospitalization (including imaging, microbiology, ancillary and laboratory) are listed below for reference.    Significant Diagnostic Studies: Dg Chest Port 1 View  Result Date: 12/11/2018 CLINICAL DATA:  Shortness of breath EXAM: PORTABLE CHEST 1 VIEW COMPARISON:  December 08, 2018. FINDINGS: Tracheostomy catheter tip is 7.5 cm above the carina. No pneumothorax. Lungs are hyperexpanded with scattered areas of scarring bilaterally. There is apparent bullous disease in the upper lobes. There is no frank edema or consolidation. The heart size is normal. The pulmonary vascularity reflects underlying emphysematous change with diminished upper lobe vascularity, stable. No adenopathy. There is aortic atherosclerosis. No bone lesions. IMPRESSION: Tracheostomy as described. 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 No appreciable edema or consolidation. Stable cardiac silhouette. There is aortic atherosclerosis. Electronically Signed   By: Bretta BangWilliam  Woodruff III M.D.   On: 12/11/2018 08:46   Dg Chest Port 1 View  Result Date: 12/08/2018 CLINICAL DATA:  History of COPD.  Tracheostomy. EXAM: PORTABLE CHEST 1 VIEW COMPARISON:  12/03/2018. FINDINGS: Tracheostomy tube in stable position. Gastrostomy tube noted over the stomach. Improved aeration in the lung bases. Stable nodular opacity over the left lung base again most consistent with nipple shadow. This could be compartment PA lateral chest x-ray. COPD. Heart size stable. IMPRESSION: 1. Tracheostomy tube in stable position. Gastrostomy tube noted over the stomach. 2. Improved aeration both lung bases. Persistent nodular density noted over the left lung base  again consistent with nipple shadow. This could be confirmed with PA and lateral chest x-ray. COPD. No new infiltrates. Electronically Signed   By: Maisie Fushomas  Register   On: 12/08/2018 11:21   Dg Chest Port 1 View  Result Date: 12/03/2018 CLINICAL DATA:  Acute on chronic respiratory failure EXAM: PORTABLE CHEST 1 VIEW COMPARISON:  12/02/2018 FINDINGS: Tracheostomy in satisfactory position. Emphysema with bullous changes at the lung apices. Nipple shadow overlying the left lower  lung. Bibasilar scarring/atelectasis. No focal consolidation. No pleural effusion or pneumothorax. The heart is normal in size. IMPRESSION: No evidence of acute cardiopulmonary disease. Electronically Signed   By: Charline Bills M.D.   On: 12/03/2018 08:00   Dg Chest Port 1 View  Result Date: 12/02/2018 CLINICAL DATA:  Shortness of breath. EXAM: PORTABLE CHEST 1 VIEW COMPARISON:  Radiograph of Nov 30, 2018. FINDINGS: The heart size and mediastinal contours are within normal limits. Tracheostomy tube is unchanged in position. Extensive emphysematous disease is noted in both lungs. Stable bibasilar opacities are noted concerning for scarring, atelectasis or possibly infiltrates. Stable rounded density is noted in left lung base which may represent focal inflammation. The visualized skeletal structures are unremarkable. IMPRESSION: Stable bibasilar opacities are noted concerning for scarring, atelectasis or possibly infiltrates. Stable rounded density is noted in left lung base which may represent focal infiltrate, but if it is unchanged in follow-up radiographs, CT scan of the chest is recommended to rule out possible nodule or mass. Emphysema (ICD10-J43.9). Electronically Signed   By: Lupita Raider M.D.   On: 12/02/2018 08:57   Portable Chest Xray  Result Date: 11/30/2018 CLINICAL DATA:  Respiratory failure, tracheostomy EXAM: PORTABLE CHEST 1 VIEW COMPARISON:  11/29/2018 FINDINGS: Hyperinflation of the lungs. Tracheostomy is  unchanged. Patchy opacities in the lung bases have improved. Heart is normal size. No effusions or acute bony abnormality. IMPRESSION: Hyperinflation, stable.  Improving patchy bibasilar opacities. Electronically Signed   By: Charlett Nose M.D.   On: 11/30/2018 08:09   Dg Chest Port 1 View  Result Date: 11/29/2018 CLINICAL DATA:  Respiratory distress EXAM: PORTABLE CHEST 1 VIEW COMPARISON:  None. FINDINGS: Cardiac shadows within normal limits. Tracheostomy tube is noted in satisfactory position. Patchy infiltrates are noted in the bases bilaterally. No sizable effusion is seen. Hyperinflation consistent with COPD is noted. No bony abnormality is seen. IMPRESSION: Mild patchy bibasilar infiltrates. COPD. Electronically Signed   By: Alcide Clever M.D.   On: 11/29/2018 00:25   Dg Foot 2 Views Left  Result Date: 12/12/2018 CLINICAL DATA:  Swelling to top of foot EXAM: LEFT FOOT - 2 VIEW COMPARISON:  None. FINDINGS: Soft tissue swelling along the dorsum of the foot overlying the metatarsals. No acute bony abnormality. Specifically, no fracture, subluxation, or dislocation. Early joint space narrowing in the tarsal metatarsal joints and 1st MTP joint. IMPRESSION: No acute bony abnormality. Electronically Signed   By: Charlett Nose M.D.   On: 12/12/2018 10:38   Dg Swallowing Func-speech Pathology  Result Date: 12/14/2018 Objective Swallowing Evaluation: Type of Study: MBS-Modified Barium Swallow Study  Patient Details Name: Brixon Zhen MRN: [id number] Date of Birth: Apr 01, 1951 Today's Date: 12/14/2018 Time: SLP Start Time (ACUTE ONLY): 1233 -SLP Stop Time (ACUTE ONLY): 1245 SLP Time Calculation (min) (ACUTE ONLY): 12 min Past Medical History: Past Medical History: Diagnosis Date  Anemia   Anxiety   BPH (benign prostatic hyperplasia)   COPD (chronic obstructive pulmonary disease) (HCC)   GERD (gastroesophageal reflux disease)   Hyperlipidemia   Hypertension   Major depressive disorder   Ventilator dependent  (HCC)  Past Surgical History: No past surgical history on file. HPI: 68 y.o. male with history of COPD with trach/PEG and vent dependent presently on transplant list at [facility], hypertension presents to the ER with complaints of increasing shortness of breath over the last 3 days and has been having increasing bloody discharge over the last 1 to 2 weeks. Patient admitted from [facility].  Subjective: pt alert,  cooperative Assessment / Plan / Recommendation CHL IP CLINICAL IMPRESSIONS 12/14/2018 Clinical Impression [name] oropharyngeal swallow was [facility] with no aspiration and good swallow efficiency. Upon esophageal scan, the barium tablet did appear to stop in the distal esophagus, but with other boluses able to transit around it (MD not present to confirm). Will start regular diet and thin liquids, although encouraged pt to go slowly with resuming his diet and to take frequent rest breaks. Will f/u for tolerance and reinforcement of aspiration and esophageal precautions. SLP Visit Diagnosis Dysphagia, unspecified (R13.10) Attention and concentration deficit following -- Frontal lobe and executive function deficit following -- Impact on safety and function Mild aspiration risk   CHL IP TREATMENT RECOMMENDATION 12/14/2018 Treatment Recommendations Therapy as outlined in treatment plan below   Prognosis 12/14/2018 Prognosis for Safe Diet Advancement Good Barriers to Reach Goals -- Barriers/Prognosis Comment -- CHL IP DIET RECOMMENDATION 12/14/2018 SLP Diet Recommendations Regular solids;Thin liquid Liquid Administration via Cup;Straw Medication Administration Whole meds with liquid Compensations Slow rate;Small sips/bites;Follow solids with liquid Postural Changes Remain semi-upright after after feeds/meals (Comment);Seated upright at 90 degrees   CHL IP OTHER RECOMMENDATIONS 12/14/2018 Recommended Consults Consider esophageal assessment Oral Care Recommendations Oral care BID Other Recommendations Place PMSV during PO intake   CHL  IP FOLLOW UP RECOMMENDATIONS 12/14/2018 Follow up Recommendations Skilled Nursing facility   [facility] IP FREQUENCY AND DURATION 12/14/2018 Speech Therapy Frequency (ACUTE ONLY) min 2x/week Treatment Duration 2 weeks      CHL IP ORAL PHASE 12/14/2018 Oral Phase WFL Oral - Pudding Teaspoon -- Oral - Pudding Cup -- Oral - Honey Teaspoon -- Oral - Honey Cup -- Oral - Nectar Teaspoon -- Oral - Nectar Cup -- Oral - Nectar Straw -- Oral - Thin Teaspoon -- Oral - Thin Cup -- Oral - Thin Straw -- Oral - Puree -- Oral - Mech Soft -- Oral - Regular -- Oral - Multi-Consistency -- Oral - Pill -- Oral Phase - Comment --  CHL IP PHARYNGEAL PHASE 12/14/2018 Pharyngeal Phase WFL Pharyngeal- Pudding Teaspoon -- Pharyngeal -- Pharyngeal- Pudding Cup -- Pharyngeal -- Pharyngeal- Honey Teaspoon -- Pharyngeal -- Pharyngeal- Honey Cup -- Pharyngeal -- Pharyngeal- Nectar Teaspoon -- Pharyngeal -- Pharyngeal- Nectar Cup -- Pharyngeal -- Pharyngeal- Nectar Straw -- Pharyngeal -- Pharyngeal- Thin Teaspoon -- Pharyngeal -- Pharyngeal- Thin Cup -- Pharyngeal -- Pharyngeal- Thin Straw -- Pharyngeal -- Pharyngeal- Puree -- Pharyngeal -- Pharyngeal- Mechanical Soft -- Pharyngeal -- Pharyngeal- Regular -- Pharyngeal -- Pharyngeal- Multi-consistency -- Pharyngeal -- Pharyngeal- Pill -- Pharyngeal -- Pharyngeal Comment --  CHL IP CERVICAL ESOPHAGEAL PHASE 12/14/2018 Cervical Esophageal Phase WFL Pudding Teaspoon -- Pudding Cup -- Honey Teaspoon -- Honey Cup -- Nectar Teaspoon -- Nectar Cup -- Nectar Straw -- Thin Teaspoon -- Thin Cup -- Thin Straw -- Puree -- Mechanical Soft -- Regular -- Multi-consistency -- Pill -- Cervical Esophageal Comment -- Venita Sheffield Nix 12/14/2018, 1:46 PM  Pollyann Glen, M.A. CCC-SLP Acute Rehabilitation Services Pager [phone] Office [phone]             Vas [place] Lower Extremity Venous (dvt)  Result Date: 11/29/2018  Lower Venous Study Indications: Edema.  Performing Technologist: Toma Copier RVS  Examination Guidelines:  A complete evaluation includes B-mode imaging, spectral Doppler, color Doppler, and power Doppler as needed of all accessible portions of each vessel. Bilateral testing is considered an integral part of a complete examination. Limited examinations for reoccurring indications may be performed as noted.  +---------+---------------+---------+-----------+----------+-------+  RIGHT     Compressibility Phasicity  Spontaneity Properties Summary  +---------+---------------+---------+-----------+----------+-------+  CFV       Full            Yes       Yes                             +---------+---------------+---------+-----------+----------+-------+  SFJ       Full                                                      +---------+---------------+---------+-----------+----------+-------+  FV Prox   Full            Yes       Yes                             +---------+---------------+---------+-----------+----------+-------+  FV Mid    Full                                                      +---------+---------------+---------+-----------+----------+-------+  FV Distal Full            Yes       Yes                             +---------+---------------+---------+-----------+----------+-------+  PFV       Full            Yes       Yes                             +---------+---------------+---------+-----------+----------+-------+  POP       Full            Yes       Yes                             +---------+---------------+---------+-----------+----------+-------+  PTV       Full                                                      +---------+---------------+---------+-----------+----------+-------+  PERO      Full                                                      +---------+---------------+---------+-----------+----------+-------+   +---------+---------------+---------+-----------+----------+-------+  LEFT      Compressibility Phasicity Spontaneity Properties Summary   +---------+---------------+---------+-----------+----------+-------+  CFV       Full            Yes       Yes                             +---------+---------------+---------+-----------+----------+-------+  SFJ       Full                                                      +---------+---------------+---------+-----------+----------+-------+  FV Prox   Full            Yes       Yes                             +---------+---------------+---------+-----------+----------+-------+  FV Mid    Full                                                      +---------+---------------+---------+-----------+----------+-------+  FV Distal Full            Yes       Yes                             +---------+---------------+---------+-----------+----------+-------+  PFV       Full            Yes       Yes                             +---------+---------------+---------+-----------+----------+-------+  POP       Full            Yes       Yes                             +---------+---------------+---------+-----------+----------+-------+  PTV       Full                                                      +---------+---------------+---------+-----------+----------+-------+  PERO      Full                                                      +---------+---------------+---------+-----------+----------+-------+     Summary: Right: There is no evidence of deep vein thrombosis in the lower extremity. No cystic structure found in the popliteal fossa. Left: There is no evidence of deep vein thrombosis in the lower extremity. No cystic structure found in the popliteal fossa.  *See table(s) above for measurements and observations. Electronically signed by Lemar Livings MD on 11/29/2018 at 4:27:34 PM.    Final     Microbiology: No results found for this or any previous visit (from the past 240 hour(s)).   Labs: Basic Metabolic Panel: Recent Labs  Lab 12/12/18 0912  NA 138  K 3.9  CL 100  CO2 29  GLUCOSE 117*  BUN 17  CREATININE  0.54*  CALCIUM 10.0   Liver Function Tests: Recent  Labs  Lab 12/12/18 0912  AST 19  ALT 22  ALKPHOS 78  BILITOT 0.3  PROT 6.7  ALBUMIN 3.4*   No results for input(s): LIPASE, AMYLASE in the last 168 hours. No results for input(s): AMMONIA in the last 168 hours. CBC: Recent Labs  Lab 12/12/18 0912  WBC 11.2*  HGB 10.0*  HCT 33.2*  MCV 88.5  PLT 365   Cardiac Enzymes: No results for input(s): CKTOTAL, CKMB, CKMBINDEX, TROPONINI in the last 168 hours. BNP: BNP (last 3 results) Recent Labs    11/28/18 2346  BNP 75.2    ProBNP (last 3 results) No results for input(s): PROBNP in the last 8760 hours.  CBG: Recent Labs  Lab 12/13/18 1953 12/13/18 2342 12/14/18 0338 12/14/18 0716 12/14/18 1150  GLUCAP 140* 91 90 116* 101*       Signed:  Darlin Droparole N Jozlynn Plaia, MD [facility] 12/14/2018, 3:24 PM

## 2018-12-14 NOTE — Progress Notes (Signed)
Report given to [facility] at [place].

## 2018-12-15 ENCOUNTER — Other Ambulatory Visit (HOSPITAL_COMMUNITY): Payer: Self-pay

## 2018-12-15 ENCOUNTER — Encounter: Payer: Self-pay | Admitting: Internal Medicine

## 2018-12-15 ENCOUNTER — Encounter: Payer: Self-pay | Admitting: *Deleted

## 2018-12-15 DIAGNOSIS — J441 Chronic obstructive pulmonary disease with (acute) exacerbation: Secondary | ICD-10-CM | POA: Diagnosis present

## 2018-12-15 DIAGNOSIS — Z9911 Dependence on respirator [ventilator] status: Secondary | ICD-10-CM

## 2018-12-15 DIAGNOSIS — F411 Generalized anxiety disorder: Secondary | ICD-10-CM | POA: Diagnosis present

## 2018-12-15 DIAGNOSIS — J9621 Acute and chronic respiratory failure with hypoxia: Secondary | ICD-10-CM

## 2018-12-15 DIAGNOSIS — K219 Gastro-esophageal reflux disease without esophagitis: Secondary | ICD-10-CM | POA: Diagnosis present

## 2018-12-15 LAB — COMPREHENSIVE METABOLIC PANEL
ALT: 19 U/L (ref 0–44)
AST: 15 U/L (ref 15–41)
Albumin: 3 g/dL — ABNORMAL LOW (ref 3.5–5.0)
Alkaline Phosphatase: 66 U/L (ref 38–126)
Anion gap: 9 (ref 5–15)
BUN: 14 mg/dL (ref 8–23)
CO2: 32 mmol/L (ref 22–32)
Calcium: 9.5 mg/dL (ref 8.9–10.3)
Chloride: 96 mmol/L — ABNORMAL LOW (ref 98–111)
Creatinine, Ser: 0.53 mg/dL — ABNORMAL LOW (ref 0.61–1.24)
GFR calc Af Amer: >60 mL/min (ref >60)
GFR calc non Af Amer: >60 mL/min (ref >60)
Glucose, Bld: 113 mg/dL — ABNORMAL HIGH (ref 70–99)
Potassium: 3.5 mmol/L (ref 3.5–5.1)
Sodium: 137 mmol/L (ref 135–145)
Total Bilirubin: 0.9 mg/dL (ref 0.3–1.2)
Total Protein: 5.7 g/dL — ABNORMAL LOW (ref 6.5–8.1)

## 2018-12-15 LAB — PROTIME-INR
INR: 1.2 (ref 0.8–1.2)
Prothrombin Time: 14.6 seconds (ref 11.4–15.2)

## 2018-12-15 LAB — URINALYSIS, ROUTINE W REFLEX MICROSCOPIC
Bilirubin Urine: NEGATIVE
Glucose, UA: NEGATIVE mg/dL
Hgb urine dipstick: NEGATIVE
Ketones, ur: NEGATIVE mg/dL
Leukocytes,Ua: NEGATIVE
Nitrite: NEGATIVE
Protein, ur: NEGATIVE mg/dL
Specific Gravity, Urine: 1.009 (ref 1.005–1.030)
pH: 6 (ref 5.0–8.0)

## 2018-12-15 LAB — MAGNESIUM: Magnesium: 1.9 mg/dL (ref 1.7–2.4)

## 2018-12-15 LAB — HEMOGLOBIN A1C
Hgb A1c MFr Bld: 5.5 % (ref 4.8–5.6)
Mean Plasma Glucose: 111.15 mg/dL

## 2018-12-15 LAB — PHOSPHORUS: Phosphorus: 4.4 mg/dL (ref 2.5–4.6)

## 2018-12-15 LAB — TSH: TSH: 3.41 u[IU]/mL (ref 0.350–4.500)

## 2018-12-15 NOTE — Congregational Nurse Program (Signed)
[id number] with patient to hold prayers of encouragement as her awaits transplants at [facility], patient asked for prayers. Several bible passages of encouragment read to patient.  Patient appreciative of visit.

## 2018-12-15 NOTE — Consult Note (Signed)
[facility]  Date of Service: 12/15/2018  PULMONARY CRITICAL CARE CONSULT   Robert Pham  ZOX:[id number]  DOB: Jan 09, 1951   DOA: 12/14/2018  Referring Physician: Carron CurieAli Hijazi, MD  HPI: Robert Pham is a 68 y.o. male seen for follow up of Acute on Chronic Respiratory Failure.  Patient with multiple medical problems including COPD chronic tracheostomy and a PEG tube apparently [facility]  Patient presented to the hospital with increasing shortness of breath and bloody discharge.  In the emergency department patient had a chest x-ray done which was basically unremarkable pulmonary did see the patient and started on IV steroids and nebulizer treatments for presumed COPD exacerbation.  Further work-up revealed a urinary tract infection and also Pseudomonas pneumonia.  Patient was treated for fluid overload with Lasix and also treated for the Pseudomonas pneumonia with antibiotics.  Patient does have a history of anxiety disorder.  Was evaluated by swallowing did pass the swallowing assessment and patient also has been tolerating T collar currently.  Review of Systems:  ROS performed and is unremarkable other than noted above.  Past Medical History:  Diagnosis Date  . Anemia   . Anxiety   . BPH (benign prostatic hyperplasia)   . COPD (chronic obstructive pulmonary disease) (HCC)   . GERD (gastroesophageal reflux disease)   . Hyperlipidemia   . Hypertension   . Major depressive disorder   . Ventilator dependent (HCC)     No past surgical history on file.  Social History:    reports that he has never smoked. He has never used smokeless tobacco. He reports previous alcohol use. He reports previous drug use.  Family History: Non-Contributory to the present illness  Allergies  Allergen Reactions  . Codeine   . Klonopin [Clonazepam]     Medications: Reviewed on Rounds  Physical  Exam:  Vitals: Temperature 98.3 pulse 64 respiratory rate 12 blood pressure 109/69 saturations 100%  Ventilator Settings mode of ventilation is off the ventilator on T collar FiO2 35%  . General: Comfortable at this time . Eyes: Grossly normal lids, irises & conjunctiva . ENT: grossly tongue is normal . Neck: no obvious mass . Cardiovascular: S1-S2 normal no gallop or rub . Respiratory: No rhonchi no rales . Abdomen: Soft and nontender . Skin: no rash seen on limited exam . Musculoskeletal: not rigid . Psychiatric:unable to assess . Neurologic: no seizure no involuntary movements         Labs on Admission:  Basic Metabolic Panel: Recent Labs  Lab 12/12/18 0912 12/15/18 0537  NA 138 137  K 3.9 3.5  CL 100 96*  CO2 29 32  GLUCOSE 117* 113*  BUN 17 14  CREATININE 0.54* 0.53*  CALCIUM 10.0 9.5  MG  --  1.9  PHOS  --  4.4    No results for input(s): PHART, PCO2ART, PO2ART, HCO3, O2SAT in the last 168 hours.  Liver Function Tests: Recent Labs  Lab 12/12/18 0912 12/15/18 0537  AST 19 15  ALT 22 19  ALKPHOS 78 66  BILITOT 0.3 0.9  PROT 6.7 5.7*  ALBUMIN 3.4* 3.0*   No results for input(s): LIPASE, AMYLASE in the last 168 hours. No results for input(s): AMMONIA in the last 168 hours.  CBC: Recent Labs  Lab 12/12/18 0912 12/14/18 1852  WBC 11.2* 11.3*  NEUTROABS  --  8.8*  HGB 10.0* 9.5*  HCT 33.2* 32.2*  MCV 88.5 87.7  PLT 365 346    Cardiac Enzymes: No results for input(s): CKTOTAL, CKMB, CKMBINDEX, TROPONINI in the last 168 hours.  BNP (last 3 results) Recent Labs    11/28/18 2346  BNP 75.2    ProBNP (last 3 results) No results for input(s): PROBNP in the last 8760 hours.   Radiological Exams on Admission: Dg Abd 1 View  Result Date: 12/14/2018 CLINICAL DATA:  Peg tube placement. EXAM: ABDOMEN - 1 VIEW COMPARISON:  None. FINDINGS: The PEG tube projects over the medial left lower chest, likely within the portion of stomach extending above  the diaphragm. No leak around the PEG tube bulb. Contrast extends into the small bowel. IMPRESSION: The PEG tube projects over the medial left lower chest, probably within a portion of stomach extending above the diaphragm. No evidence of leak. Contrast seen in the small bowel. Electronically Signed   By: Gerome Samavid  Williams III M.D   On: 12/14/2018 18:55   Dg Chest Port 1 View  Result Date: 12/14/2018 CLINICAL DATA:  Percutaneous PEG tube placement. EXAM: PORTABLE CHEST 1 VIEW COMPARISON:  December 11, 2018 FINDINGS: The tracheostomy tube terminates in the trachea. There is a prominent skin fold over the lateral right chest with lung markings on both sides. Emphysematous changes in the lungs. No change in the cardiomediastinal silhouette. No focal infiltrate. No other acute abnormalities. Small hiatal hernia. IMPRESSION: Support apparatus as above. Emphysematous. No acute interval change. Electronically Signed   By: Gerome Samavid  Williams III M.D   On: 12/14/2018 18:53   Dg Foot 2 Views Left  Result Date: 12/12/2018 CLINICAL DATA:  Swelling to top of foot EXAM: LEFT FOOT - 2 VIEW COMPARISON:  None. FINDINGS: Soft tissue swelling along the dorsum of the foot overlying the metatarsals. No acute bony abnormality. Specifically, no fracture, subluxation, or dislocation. Early joint space narrowing in the tarsal metatarsal joints and 1st MTP joint. IMPRESSION: No acute bony abnormality. Electronically Signed   By: Charlett NoseKevin  Dover M.D.   On: 12/12/2018 10:38   Dg Swallowing Func-speech Pathology  Result Date: 12/14/2018 Objective Swallowing Evaluation: Type of Study: MBS-Modified Barium Swallow Study  Patient Details Name: Robert Pham MRN: [id number] Date of Birth: 20-Jun-1951 Today's Date: 12/14/2018 Time: SLP Start Time (ACUTE ONLY): 1233 -SLP Stop Time (ACUTE ONLY): 1245 SLP Time Calculation (min) (ACUTE ONLY): 12 min Past Medical History: Past Medical History: Diagnosis Date . Anemia  . Anxiety  . BPH (benign prostatic  hyperplasia)  . COPD (chronic obstructive pulmonary disease) (HCC)  . GERD (gastroesophageal reflux disease)  . Hyperlipidemia  . Hypertension  . Major depressive disorder  . Ventilator dependent [facility](HCC)  Past Surgical History: No past surgical history on file. HPI: 68 y.o. male with history of COPD with trach/PEG and vent dependent [facility], hypertension presents to the ER with complaints of increasing shortness of breath over the last 3 days and has been having increasing bloody discharge over the last 1 to 2 weeks. Patient admitted from [facility].  Subjective: pt alert, cooperative Assessment / Plan / Recommendation CHL IP CLINICAL IMPRESSIONS 12/14/2018 Clinical Impression [name] [facility]roph[facility] with no aspiration and good swallow efficiency. Upon esophageal scan, the barium tablet did appear to stop in the distal esophagus, but with other boluses able to transit around it (MD not present to confirm). Will start regular diet and thin liquids, although encouraged pt to go slowly with resuming his diet and to take frequent rest breaks. Will f/u for tolerance and reinforcement  of aspiration and esophageal precautions. SLP Visit Diagnosis Dysphagia, unspecified (R13.10) Attention and concentration deficit following -- Frontal lobe and executive function deficit following -- Impact on safety and function Mild aspiration risk   CHL IP TREATMENT RECOMMENDATION 12/14/2018 Treatment Recommendations Therapy as outlined in treatment plan below   Prognosis 12/14/2018 Prognosis for Safe Diet Advancement Good Barriers to Reach Goals -- Barriers/Prognosis Comment -- CHL IP DIET RECOMMENDATION 12/14/2018 SLP Diet Recommendations Regular solids;Thin liquid Liquid Administration via Cup;Straw Medication Administration Whole meds with liquid Compensations Slow rate;Small sips/bites;Follow solids with liquid Postural Changes Remain semi-upright after after feeds/meals (Comment);Seated upright at 90  degrees   CHL IP OTHER RECOMMENDATIONS 12/14/2018 Recommended Consults Consider esophageal assessment Oral Care Recommendations Oral care BID Other Recommendations Place PMSV during PO intake   CHL IP FOLLOW UP RECOMMENDATIONS 12/14/2018 Follow up Recommendations [facility] F[facility]QU[facility] D[facility]ATION 12/14/2018 Speech Therapy Frequency (ACUTE ONLY) min 2x/week Treatment Duration 2 weeks      CHL IP ORAL PHASE 12/14/2018 Oral Phase WFL Oral - Pudding Teaspoon -- Oral - Pudding Cup -- Oral - Honey Teaspoon -- Oral - Honey Cup -- Oral - Nectar Teaspoon -- Oral - Nectar Cup -- Oral - Nectar Straw -- Oral - Thin Teaspoon -- Oral - Thin Cup -- Oral - Thin Straw -- Oral - Puree -- Oral - Mech Soft -- Oral - Regular -- Oral - Multi-Consistency -- Oral - Pill -- Oral Phase - Comment --  CHL IP PHARYNGEAL PHASE 12/14/2018 Pharyngeal Phase WFL Pharyngeal- Pudding Teaspoon -- Pharyngeal -- Pharyngeal- Pudding Cup -- Pharyngeal -- Pharyngeal- Honey Teaspoon -- Pharyngeal -- Pharyngeal- Honey Cup -- Pharyngeal -- Pharyngeal- Nectar Teaspoon -- Pharyngeal -- Pharyngeal- Nectar Cup -- Pharyngeal -- Pharyngeal- Nectar Straw -- Pharyngeal -- Pharyngeal- Thin Teaspoon -- Pharyngeal -- Pharyngeal- Thin Cup -- Pharyngeal -- Pharyngeal- Thin Straw -- Pharyngeal -- Pharyngeal- Puree -- Pharyngeal -- Pharyngeal- Mechanical Soft -- Pharyngeal -- Pharyngeal- Regular -- Pharyngeal -- Pharyngeal- Multi-consistency -- Pharyngeal -- Pharyngeal- Pill -- Pharyngeal -- Pharyngeal Comment --  CHL IP CERVICAL ESOPHAGEAL PHASE 12/14/2018 Cervical Esophageal Phase WFL Pudding Teaspoon -- Pudding Cup -- Honey Teaspoon -- Honey Cup -- Nectar Teaspoon -- Nectar Cup -- Nectar Straw -- Thin Teaspoon -- Thin Cup -- Thin Straw -- Puree -- Mechanical Soft -- Regular -- Multi-consistency -- Pill -- Cervical Esophageal Comment -- Virl AxeLaura P Nix 12/14/2018, 1:46 PM  Ivar DrapeLaura Nix, M.A. CCC-SLP Acute Rehabilitation Services Pager [phone] Office  [phone]              Assessment/Plan Active Problems:   Acute on chronic respiratory failure with hypoxia (HCC)   Obstructive chronic bronchitis with exacerbation (HCC)   GERD (gastroesophageal reflux disease)   Generalized anxiety disorder   Ventilator dependent (HCC)   1. Acute on chronic respiratory failure with hypoxia right now patient is doing well with T collar trials which will be continued patient currently is on 35% FiO2 continue secretion management pulmonary toilet. 2. Severe COPD patient had an exacerbation which was treated patient was on 40% oxygen for the trach collar.  And the ultimate plan is for him to go to the nursing facility.  He is supposed to go with a cuffless trach so we will go ahead and change the trach out to meet that requirement. 3. GERD right now is asymptomatic we will continue with supportive care. 4. Anxiety disorder needs adjustment of his medications defer to the primary care team for this  I  have personally seen and evaluated the patient, evaluated laboratory and imaging results, formulated the assessment and plan and placed orders. The Patient requires high complexity decision making for assessment and support.  Case was discussed on Rounds with the Respiratory Therapy Staff Time Spent 19minutes  Allyne Gee, MD [facility] Pulmonary Critical Care Medicine Sleep Medicine

## 2018-12-16 LAB — URINE CULTURE: Culture: NO GROWTH

## 2018-12-16 NOTE — Progress Notes (Addendum)
[facility]   PULMONARY CRITICAL CARE SERVICE  PROGRESS NOTE  Date of Service: 12/16/2018  Hillery JacksJimmie Mohammed  ZOX:[id number]  DOB: 1950-10-29   DOA: 12/14/2018  Referring Physician: Carron CurieAli Hijazi, MD  HPI: Hillery JacksJimmie Noon is a 68 y.o. male seen for follow up of Acute on Chronic Respiratory Failure.  Patient is currently aerosol trach collar 40% FiO2 with heated high flow nasal cannula 10 L and 30% FiO2.  Patient was on much more oxygen through the nasal cannula however respiratory has been able to wean it today.  Weaning will continue at this time.  Medications: Reviewed on Rounds  Physical Exam:  Vitals: Pulse 76 respirations 24 BP 113/60 O2 sat 99% 97.7  Ventilator Settings ATC 40% with heated high flow 10 L and 30%  . General: Comfortable at this time . Eyes: Grossly normal lids, irises & conjunctiva . ENT: grossly tongue is normal . Neck: no obvious mass . Cardiovascular: S1 S2 normal no gallop . Respiratory: No rales or rhonchi noted . Abdomen: soft . Skin: no rash seen on limited exam . Musculoskeletal: not rigid . Psychiatric:unable to assess . Neurologic: no seizure no involuntary movements         Lab Data:   Basic Metabolic Panel: Recent Labs  Lab 12/12/18 0912 12/15/18 0537  NA 138 137  K 3.9 3.5  CL 100 96*  CO2 29 32  GLUCOSE 117* 113*  BUN 17 14  CREATININE 0.54* 0.53*  CALCIUM 10.0 9.5  MG  --  1.9  PHOS  --  4.4    ABG: No results for input(s): PHART, PCO2ART, PO2ART, HCO3, O2SAT in the last 168 hours.  Liver Function Tests: Recent Labs  Lab 12/12/18 0912 12/15/18 0537  AST 19 15  ALT 22 19  ALKPHOS 78 66  BILITOT 0.3 0.9  PROT 6.7 5.7*  ALBUMIN 3.4* 3.0*   No results for input(s): LIPASE, AMYLASE in the last 168 hours. No results for input(s): AMMONIA in the last 168 hours.  CBC: Recent Labs  Lab 12/12/18 0912 12/14/18 1852  WBC 11.2* 11.3*  NEUTROABS  --  8.8*  HGB 10.0* 9.5*   HCT 33.2* 32.2*  MCV 88.5 87.7  PLT 365 346    Cardiac Enzymes: No results for input(s): CKTOTAL, CKMB, CKMBINDEX, TROPONINI in the last 168 hours.  BNP (last 3 results) Recent Labs    11/28/18 2346  BNP 75.2    ProBNP (last 3 results) No results for input(s): PROBNP in the last 8760 hours.  Radiological Exams: Dg Abd 1 View  Result Date: 12/14/2018 CLINICAL DATA:  Peg tube placement. EXAM: ABDOMEN - 1 VIEW COMPARISON:  None. FINDINGS: The PEG tube projects over the medial left lower chest, likely within the portion of stomach extending above the diaphragm. No leak around the PEG tube bulb. Contrast extends into the small bowel. IMPRESSION: The PEG tube projects over the medial left lower chest, probably within a portion of stomach extending above the diaphragm. No evidence of leak. Contrast seen in the small bowel. Electronically Signed   By: Gerome Samavid  Williams III M.D   On: 12/14/2018 18:55   Dg Chest Port 1 View  Result Date: 12/14/2018 CLINICAL DATA:  Percutaneous PEG tube placement. EXAM: PORTABLE CHEST 1 VIEW COMPARISON:  December 11, 2018 FINDINGS: The tracheostomy tube terminates in the trachea. There is a prominent skin fold over the lateral right chest with lung markings on both sides. Emphysematous changes in the lungs. No  change in the cardiomediastinal silhouette. No focal infiltrate. No other acute abnormalities. Small hiatal hernia. IMPRESSION: Support apparatus as above. Emphysematous. No acute interval change. Electronically Signed   By: Dorise Bullion III M.D   On: 12/14/2018 18:53   Dg Abd Portable 1v  Result Date: 12/15/2018 CLINICAL DATA:  Impaction of the bowels. EXAM: PORTABLE ABDOMEN - 1 VIEW COMPARISON:  Radiograph of December 14, 2018. FINDINGS: Residual contrast is noted in the nondilated colon. No abnormal bowel dilatation is noted. Gastrostomy tube is again noted with distal tip in portion of sliding-type hiatal hernia. IMPRESSION: Residual contrast seen in nondilated  colon. No evidence of bowel obstruction or ileus. Electronically Signed   By: Marijo Conception M.D.   On: 12/15/2018 15:46    Assessment/Plan Active Problems:   Acute on chronic respiratory failure with hypoxia (HCC)   Obstructive chronic bronchitis with exacerbation (HCC)   GERD (gastroesophageal reflux disease)   Generalized anxiety disorder   Ventilator dependent ([place])   1. Acute on chronic respiratory failure with hypoxia patient is doing well on aerosol trach collar 40% with heated high flow down to 10 L and 30% FiO2 will continue to wean as tolerated.  Continue supportive measures and pulmonary toilet. 2. Severe COPD continue supportive measures and oxygen to keep patient sat above 88% 3. GERD asymptomatic at this time continue supportive care 4. Anxiety disorder stable primary care team to manage.   I have personally seen and evaluated the patient, evaluated laboratory and imaging results, formulated the assessment and plan and placed orders. The Patient requires high complexity decision making for assessment and support.  Case was discussed on Rounds with the Respiratory Therapy Staff  Allyne Gee, MD [facility] Pulmonary Critical Care Medicine Sleep Medicine

## 2018-12-17 LAB — CBC
HCT: 33.5 % — ABNORMAL LOW (ref 39.0–52.0)
Hemoglobin: 10 g/dL — ABNORMAL LOW (ref 13.0–17.0)
MCH: 26.5 pg (ref 26.0–34.0)
MCHC: 29.9 g/dL — ABNORMAL LOW (ref 30.0–36.0)
MCV: 88.6 fL (ref 80.0–100.0)
Platelets: 308 10*3/uL (ref 150–400)
RBC: 3.78 MIL/uL — ABNORMAL LOW (ref 4.22–5.81)
RDW: 15.7 % — ABNORMAL HIGH (ref 11.5–15.5)
WBC: 7 10*3/uL (ref 4.0–10.5)
nRBC: 0 % (ref 0.0–0.2)

## 2018-12-17 LAB — BLOOD GAS, ARTERIAL
Acid-Base Excess: 5.5 mmol/L — ABNORMAL HIGH (ref 0.0–2.0)
Bicarbonate: 30.2 mmol/L — ABNORMAL HIGH (ref 20.0–28.0)
FIO2: 30
O2 Saturation: 92.4 %
Patient temperature: 97
pCO2 arterial: 47.6 mmHg (ref 32.0–48.0)
pH, Arterial: 7.413 (ref 7.350–7.450)
pO2, Arterial: 64.5 mmHg — ABNORMAL LOW (ref 83.0–108.0)

## 2018-12-17 LAB — BASIC METABOLIC PANEL
Anion gap: 9 (ref 5–15)
BUN: 13 mg/dL (ref 8–23)
CO2: 33 mmol/L — ABNORMAL HIGH (ref 22–32)
Calcium: 9.9 mg/dL (ref 8.9–10.3)
Chloride: 98 mmol/L (ref 98–111)
Creatinine, Ser: 0.6 mg/dL — ABNORMAL LOW (ref 0.61–1.24)
GFR calc Af Amer: >60 mL/min (ref >60)
GFR calc non Af Amer: >60 mL/min (ref >60)
Glucose, Bld: 121 mg/dL — ABNORMAL HIGH (ref 70–99)
Potassium: 3.4 mmol/L — ABNORMAL LOW (ref 3.5–5.1)
Sodium: 140 mmol/L (ref 135–145)

## 2018-12-17 LAB — CULTURE, RESPIRATORY W GRAM STAIN: Culture: NORMAL

## 2018-12-17 LAB — MAGNESIUM: Magnesium: 2 mg/dL (ref 1.7–2.4)

## 2018-12-17 LAB — PHOSPHORUS: Phosphorus: 4.3 mg/dL (ref 2.5–4.6)

## 2018-12-17 NOTE — Progress Notes (Addendum)
Pulmonary Critical Care Medicine [facility]   PULMONARY CRITICAL CARE SERVICE  PROGRESS NOTE  Date of Service: 12/17/2018  Robert Pham  ZOX:[id number]  DOB: 11/21/1950   DOA: 12/14/2018  Referring Physician: Merton Border, MD  HPI: Robert Pham is a 68 y.o. male seen for follow up of Acute on Chronic Respiratory Failure.  Patient remains on aerosol trach collar with 35% FiO2 and heated high flow nasal cannula 10 L and 30% FiO2.  Respiratory therapy continues to attempt to wean patient off of the heated high flow nasal cannula.  Currently satting well with no distress.  Medications: Reviewed on Rounds  Physical Exam:  Vitals: Pulse 89 respirations 28 BP 112/69 O2 sat 100% temp 97.8  Ventilator Settings ATC 35% with heated high flow nasal cannula 10 L and 30% FiO2  . General: Comfortable at this time . Eyes: Grossly normal lids, irises & conjunctiva . ENT: grossly tongue is normal . Neck: no obvious mass . Cardiovascular: S1 S2 normal no gallop . Respiratory: No rales or rhonchi noted . Abdomen: soft . Skin: no rash seen on limited exam . Musculoskeletal: not rigid . Psychiatric:unable to assess . Neurologic: no seizure no involuntary movements         Lab Data:   Basic Metabolic Panel: Recent Labs  Lab 12/12/18 0912 12/15/18 0537 12/17/18 0737  NA 138 137 140  K 3.9 3.5 3.4*  CL 100 96* 98  CO2 29 32 33*  GLUCOSE 117* 113* 121*  BUN 17 14 13   CREATININE 0.54* 0.53* 0.60*  CALCIUM 10.0 9.5 9.9  MG  --  1.9 2.0  PHOS  --  4.4 4.3    ABG: Recent Labs  Lab 12/17/18 1205  PHART 7.413  PCO2ART 47.6  PO2ART 64.5*  HCO3 30.2*  O2SAT 92.4    Liver Function Tests: Recent Labs  Lab 12/12/18 0912 12/15/18 0537  AST 19 15  ALT 22 19  ALKPHOS 78 66  BILITOT 0.3 0.9  PROT 6.7 5.7*  ALBUMIN 3.4* 3.0*   No results for input(s): LIPASE, AMYLASE in the last 168 hours. No results for input(s): AMMONIA in the last 168  hours.  CBC: Recent Labs  Lab 12/12/18 0912 12/14/18 1852 12/17/18 0737  WBC 11.2* 11.3* 7.0  NEUTROABS  --  8.8*  --   HGB 10.0* 9.5* 10.0*  HCT 33.2* 32.2* 33.5*  MCV 88.5 87.7 88.6  PLT 365 346 308    Cardiac Enzymes: No results for input(s): CKTOTAL, CKMB, CKMBINDEX, TROPONINI in the last 168 hours.  BNP (last 3 results) Recent Labs    11/28/18 2346  BNP 75.2    ProBNP (last 3 results) No results for input(s): PROBNP in the last 8760 hours.  Radiological Exams: No results found.  Assessment/Plan Active Problems:   Acute on chronic respiratory failure with hypoxia (HCC)   Obstructive chronic bronchitis with exacerbation (HCC)   GERD (gastroesophageal reflux disease)   Generalized anxiety disorder   Ventilator dependent ([place])   1. Acute on chronic respiratory failure with hypoxia patient is doing well on aerosol trach collar 40% with heated high flow down to 10 L and 30% FiO2 will continue to wean as tolerated.  Continue supportive measures and pulmonary toilet. 2. Severe COPD continue supportive measures and oxygen to keep patient sat above 88% 3. GERD asymptomatic at this time continue supportive care 4. Anxiety disorder stable primary care team to manage.   I have personally seen and evaluated the patient, evaluated laboratory  and imaging results, formulated the assessment and plan and placed orders. The Patient requires high complexity decision making for assessment and support.  Case was discussed on Rounds with the Respiratory Therapy Staff  Allyne Gee, MD [facility] Pulmonary Critical Care Medicine Sleep Medicine

## 2018-12-18 LAB — POTASSIUM: Potassium: 4.2 mmol/L (ref 3.5–5.1)

## 2018-12-18 NOTE — Progress Notes (Addendum)
Pulmonary Critical Care Medicine [place]   PULMONARY CRITICAL CARE SERVICE  PROGRESS NOTE  Date of Service: 12/18/2018  Robert Pham  HQI:[id number]  DOB: 1951-02-11   DOA: 12/14/2018  Referring Physician: Merton Border, MD  HPI: Robert Pham is a 68 y.o. male seen for follow up of Acute on Chronic Respiratory Failure.  Patient continues on aerosol trach collar 50% FiO2 satting well with no fever or distress noted.  Medications: Reviewed on Rounds  Physical Exam:  Vitals: Pulse 62 respirations 22 BP 140/50 O2 sat 96% temp 98.0  Ventilator Settings ATC 50%  . General: Comfortable at this time . Eyes: Grossly normal lids, irises & conjunctiva . ENT: grossly tongue is normal . Neck: no obvious mass . Cardiovascular: S1 S2 normal no gallop . Respiratory: No rales or rhonchi noted . Abdomen: soft . Skin: no rash seen on limited exam . Musculoskeletal: not rigid . Psychiatric:unable to assess . Neurologic: no seizure no involuntary movements         Lab Data:   Basic Metabolic Panel: Recent Labs  Lab 12/12/18 0912 12/15/18 0537 12/17/18 0737 12/18/18 0834  NA 138 137 140  --   K 3.9 3.5 3.4* 4.2  CL 100 96* 98  --   CO2 29 32 33*  --   GLUCOSE 117* 113* 121*  --   BUN 17 14 13   --   CREATININE 0.54* 0.53* 0.60*  --   CALCIUM 10.0 9.5 9.9  --   MG  --  1.9 2.0  --   PHOS  --  4.4 4.3  --     ABG: Recent Labs  Lab 12/17/18 1205  PHART 7.413  PCO2ART 47.6  PO2ART 64.5*  HCO3 30.2*  O2SAT 92.4    Liver Function Tests: Recent Labs  Lab 12/12/18 0912 12/15/18 0537  AST 19 15  ALT 22 19  ALKPHOS 78 66  BILITOT 0.3 0.9  PROT 6.7 5.7*  ALBUMIN 3.4* 3.0*   No results for input(s): LIPASE, AMYLASE in the last 168 hours. No results for input(s): AMMONIA in the last 168 hours.  CBC: Recent Labs  Lab 12/12/18 0912 12/14/18 1852 12/17/18 0737  WBC 11.2* 11.3* 7.0  NEUTROABS  --  8.8*  --   HGB 10.0* 9.5* 10.0*  HCT 33.2*  32.2* 33.5*  MCV 88.5 87.7 88.6  PLT 365 346 308    Cardiac Enzymes: No results for input(s): CKTOTAL, CKMB, CKMBINDEX, TROPONINI in the last 168 hours.  BNP (last 3 results) Recent Labs    11/28/18 2346  BNP 75.2    ProBNP (last 3 results) No results for input(s): PROBNP in the last 8760 hours.  Radiological Exams: No results found.  Assessment/Plan Active Problems:   Acute on chronic respiratory failure with hypoxia (HCC)   Obstructive chronic bronchitis with exacerbation (HCC)   GERD (gastroesophageal reflux disease)   Generalized anxiety disorder   Ventilator dependent ([place])   1. Acute on chronic respiratory failure with hypoxia patient is doing well on aerosol trach collar 50%  FiO2 will continue to wean as tolerated. Continue supportive measures and pulmonary toilet. 2. Severe COPD continue supportive measures and oxygen to keep patient sat above 88% 3. GERD asymptomatic at this time continue supportive care 4. Anxiety disorder stable primary care team to manage.   I have personally seen and evaluated the patient, evaluated laboratory and imaging results, formulated the assessment and plan and placed orders. The Patient requires high complexity decision making for  assessment and support.  Case was discussed on Rounds with the Respiratory Therapy Staff  Allyne Gee, MD [facility] Pulmonary Critical Care Medicine Sleep Medicine

## 2018-12-19 NOTE — Progress Notes (Addendum)
Pulmonary Critical Care Medicine [place]   PULMONARY CRITICAL CARE SERVICE  PROGRESS NOTE  Date of Service: 12/19/2018  Robert Pham  MVE:[id number]  DOB: 04-Apr-1951   DOA: 12/14/2018  Referring Physician: Merton Border, MD  HPI: Robert Pham is a 68 y.o. male seen for follow up of Acute on Chronic Respiratory Failure.  Patient remains on aerosol trach at 50% FiO2 satting well with no distress at this time.  Medications: Reviewed on Rounds  Physical Exam:  Vitals: Pulse 90 respirations 20 BP 144/57 O2 sat 96% temp 96.6  Ventilator Settings 50% ATC  . General: Comfortable at this time . Eyes: Grossly normal lids, irises & conjunctiva . ENT: grossly tongue is normal . Neck: no obvious mass . Cardiovascular: S1 S2 normal no gallop . Respiratory: No rales or rhonchi noted . Abdomen: soft . Skin: no rash seen on limited exam . Musculoskeletal: not rigid . Psychiatric:unable to assess . Neurologic: no seizure no involuntary movements         Lab Data:   Basic Metabolic Panel: Recent Labs  Lab 12/15/18 0537 12/17/18 0737 12/18/18 0834  NA 137 140  --   K 3.5 3.4* 4.2  CL 96* 98  --   CO2 32 33*  --   GLUCOSE 113* 121*  --   BUN 14 13  --   CREATININE 0.53* 0.60*  --   CALCIUM 9.5 9.9  --   MG 1.9 2.0  --   PHOS 4.4 4.3  --     ABG: Recent Labs  Lab 12/17/18 1205  PHART 7.413  PCO2ART 47.6  PO2ART 64.5*  HCO3 30.2*  O2SAT 92.4    Liver Function Tests: Recent Labs  Lab 12/15/18 0537  AST 15  ALT 19  ALKPHOS 66  BILITOT 0.9  PROT 5.7*  ALBUMIN 3.0*   No results for input(s): LIPASE, AMYLASE in the last 168 hours. No results for input(s): AMMONIA in the last 168 hours.  CBC: Recent Labs  Lab 12/14/18 1852 12/17/18 0737  WBC 11.3* 7.0  NEUTROABS 8.8*  --   HGB 9.5* 10.0*  HCT 32.2* 33.5*  MCV 87.7 88.6  PLT 346 308    Cardiac Enzymes: No results for input(s): CKTOTAL, CKMB, CKMBINDEX, TROPONINI in the last 168  hours.  BNP (last 3 results) Recent Labs    11/28/18 2346  BNP 75.2    ProBNP (last 3 results) No results for input(s): PROBNP in the last 8760 hours.  Radiological Exams: No results found.  Assessment/Plan Active Problems:   Acute on chronic respiratory failure with hypoxia (HCC)   Obstructive chronic bronchitis with exacerbation (HCC)   GERD (gastroesophageal reflux disease)   Generalized anxiety disorder   Ventilator dependent ([place])   1. Acute on chronic respiratory failure with hypoxia patient is doing well on aerosol trach collar 50%  FiO2 will continue to wean as tolerated. Continue supportive measures and pulmonary toilet. 2. Severe COPD continue supportive measures and oxygen to keep patient sat above 88% 3. GERD asymptomatic at this time continue supportive care 4. Anxiety disorder stable primary care team to manage.   I have personally seen and evaluated the patient, evaluated laboratory and imaging results, formulated the assessment and plan and placed orders. The Patient requires high complexity decision making for assessment and support.  Case was discussed on Rounds with the Respiratory Therapy Staff  Allyne Gee, MD [facility] Pulmonary Critical Care Medicine Sleep Medicine

## 2018-12-20 LAB — BASIC METABOLIC PANEL
Anion gap: 8 (ref 5–15)
BUN: 10 mg/dL (ref 8–23)
CO2: 31 mmol/L (ref 22–32)
Calcium: 10 mg/dL (ref 8.9–10.3)
Chloride: 101 mmol/L (ref 98–111)
Creatinine, Ser: 0.8 mg/dL (ref 0.61–1.24)
GFR calc Af Amer: >60 mL/min (ref >60)
GFR calc non Af Amer: >60 mL/min (ref >60)
Glucose, Bld: 83 mg/dL (ref 70–99)
Potassium: 4 mmol/L (ref 3.5–5.1)
Sodium: 140 mmol/L (ref 135–145)

## 2018-12-20 LAB — CBC
HCT: 31.7 % — ABNORMAL LOW (ref 39.0–52.0)
Hemoglobin: 9.4 g/dL — ABNORMAL LOW (ref 13.0–17.0)
MCH: 26.1 pg (ref 26.0–34.0)
MCHC: 29.7 g/dL — ABNORMAL LOW (ref 30.0–36.0)
MCV: 88.1 fL (ref 80.0–100.0)
Platelets: 306 10*3/uL (ref 150–400)
RBC: 3.6 MIL/uL — ABNORMAL LOW (ref 4.22–5.81)
RDW: 15.3 % (ref 11.5–15.5)
WBC: 5.8 10*3/uL (ref 4.0–10.5)
nRBC: 0 % (ref 0.0–0.2)

## 2018-12-20 LAB — MAGNESIUM: Magnesium: 1.9 mg/dL (ref 1.7–2.4)

## 2018-12-20 NOTE — Progress Notes (Addendum)
Pulmonary Critical Care Medicine [place]   PULMONARY CRITICAL CARE SERVICE  PROGRESS NOTE  Date of Service: 12/20/2018  Zayaan Kozak  OAC:[id number]  DOB: July 15, 1950   DOA: 12/14/2018  Referring Physician: Merton Border, MD  HPI: Robert Pham is a 68 y.o. male seen for follow up of Acute on Chronic Respiratory Failure.  Patient remains on aerosol trach collar 40% FiO2 satting well with no fever or distress.  Medications: Reviewed on Rounds  Physical Exam:  Vitals: Pulse 84 respirations 20 BP 144/80 O2 sat 95% temp 97.4  Ventilator Settings 40% ATC  . General: Comfortable at this time . Eyes: Grossly normal lids, irises & conjunctiva . ENT: grossly tongue is normal . Neck: no obvious mass . Cardiovascular: S1 S2 normal no gallop . Respiratory: No rales or rhonchi noted . Abdomen: soft . Skin: no rash seen on limited exam . Musculoskeletal: not rigid . Psychiatric:unable to assess . Neurologic: no seizure no involuntary movements         Lab Data:   Basic Metabolic Panel: Recent Labs  Lab 12/15/18 0537 12/17/18 0737 12/18/18 0834 12/20/18 0736  NA 137 140  --  140  K 3.5 3.4* 4.2 4.0  CL 96* 98  --  101  CO2 32 33*  --  31  GLUCOSE 113* 121*  --  83  BUN 14 13  --  10  CREATININE 0.53* 0.60*  --  0.80  CALCIUM 9.5 9.9  --  10.0  MG 1.9 2.0  --  1.9  PHOS 4.4 4.3  --   --     ABG: Recent Labs  Lab 12/17/18 1205  PHART 7.413  PCO2ART 47.6  PO2ART 64.5*  HCO3 30.2*  O2SAT 92.4    Liver Function Tests: Recent Labs  Lab 12/15/18 0537  AST 15  ALT 19  ALKPHOS 66  BILITOT 0.9  PROT 5.7*  ALBUMIN 3.0*   No results for input(s): LIPASE, AMYLASE in the last 168 hours. No results for input(s): AMMONIA in the last 168 hours.  CBC: Recent Labs  Lab 12/14/18 1852 12/17/18 0737 12/20/18 0736  WBC 11.3* 7.0 5.8  NEUTROABS 8.8*  --   --   HGB 9.5* 10.0* 9.4*  HCT 32.2* 33.5* 31.7*  MCV 87.7 88.6 88.1  PLT 346 308 306     Cardiac Enzymes: No results for input(s): CKTOTAL, CKMB, CKMBINDEX, TROPONINI in the last 168 hours.  BNP (last 3 results) Recent Labs    11/28/18 2346  BNP 75.2    ProBNP (last 3 results) No results for input(s): PROBNP in the last 8760 hours.  Radiological Exams: No results found.  Assessment/Plan Active Problems:   Acute on chronic respiratory failure with hypoxia (HCC)   Obstructive chronic bronchitis with exacerbation (HCC)   GERD (gastroesophageal reflux disease)   Generalized anxiety disorder   Ventilator dependent ([place])   1. Acute on chronic respiratory failure with hypoxia patient is doing well on aerosol trach collar40% FiO2 will continue to wean as tolerated. Continue supportive measures and pulmonary toilet. 2. Severe COPD continue supportive measures and oxygen to keep patient sat above 88% 3. GERD asymptomatic at this time continue supportive care 4. Anxiety disorder stable primary care team to manage.   I have personally seen and evaluated the patient, evaluated laboratory and imaging results, formulated the assessment and plan and placed orders. The Patient requires high complexity decision making for assessment and support.  Case was discussed on Rounds with the Respiratory Therapy  Staff  Allyne Gee, MD [facility] Pulmonary Critical Care Medicine Sleep Medicine

## 2018-12-21 NOTE — Progress Notes (Addendum)
Pulmonary Critical Care Medicine [place]   PULMONARY CRITICAL CARE SERVICE  PROGRESS NOTE  Date of Service: 12/21/2018  Robert Pham  WCB:[id number]  DOB: 1950/07/22   DOA: 12/14/2018  Referring Physician: Merton Border, MD  HPI: Robert Pham is a 68 y.o. male seen for follow up of Acute on Chronic Respiratory Failure.  Patient to 35% aerosol trach collar doing well with no distress at this time.  Medications: Reviewed on Rounds  Physical Exam:  Vitals: Pulse 86 respirations 16 BP 122/91 O2 sat 94% temp 97.2  Ventilator Settings 35% ATC  . General: Comfortable at this time . Eyes: Grossly normal lids, irises & conjunctiva . ENT: grossly tongue is normal . Neck: no obvious mass . Cardiovascular: S1 S2 normal no gallop . Respiratory: No rales or rhonchi noted . Abdomen: soft . Skin: no rash seen on limited exam . Musculoskeletal: not rigid . Psychiatric:unable to assess . Neurologic: no seizure no involuntary movements         Lab Data:   Basic Metabolic Panel: Recent Labs  Lab 12/15/18 0537 12/17/18 0737 12/18/18 0834 12/20/18 0736  NA 137 140  --  140  K 3.5 3.4* 4.2 4.0  CL 96* 98  --  101  CO2 32 33*  --  31  GLUCOSE 113* 121*  --  83  BUN 14 13  --  10  CREATININE 0.53* 0.60*  --  0.80  CALCIUM 9.5 9.9  --  10.0  MG 1.9 2.0  --  1.9  PHOS 4.4 4.3  --   --     ABG: Recent Labs  Lab 12/17/18 1205  PHART 7.413  PCO2ART 47.6  PO2ART 64.5*  HCO3 30.2*  O2SAT 92.4    Liver Function Tests: Recent Labs  Lab 12/15/18 0537  AST 15  ALT 19  ALKPHOS 66  BILITOT 0.9  PROT 5.7*  ALBUMIN 3.0*   No results for input(s): LIPASE, AMYLASE in the last 168 hours. No results for input(s): AMMONIA in the last 168 hours.  CBC: Recent Labs  Lab 12/14/18 1852 12/17/18 0737 12/20/18 0736  WBC 11.3* 7.0 5.8  NEUTROABS 8.8*  --   --   HGB 9.5* 10.0* 9.4*  HCT 32.2* 33.5* 31.7*  MCV 87.7 88.6 88.1  PLT 346 308 306    Cardiac  Enzymes: No results for input(s): CKTOTAL, CKMB, CKMBINDEX, TROPONINI in the last 168 hours.  BNP (last 3 results) Recent Labs    11/28/18 2346  BNP 75.2    ProBNP (last 3 results) No results for input(s): PROBNP in the last 8760 hours.  Radiological Exams: No results found.  Assessment/Plan Active Problems:   Acute on chronic respiratory failure with hypoxia (HCC)   Obstructive chronic bronchitis with exacerbation (HCC)   GERD (gastroesophageal reflux disease)   Generalized anxiety disorder   Ventilator dependent ([place])   1. Acute on chronic respiratory failure with hypoxia patient is doing well on aerosol trach collar35% FiO2 will continue to wean as tolerated. Continue supportive measures and pulmonary toilet. 2. Severe COPD continue supportive measures and oxygen to keep patient sat above 88% 3. GERD asymptomatic at this time continue supportive care 4. Anxiety disorder stable primary care bibasilar 80 will sign amounts more all-year-old man stable we are going to alternate on year.  The other is [address] on his way to initiate treatment to manage.  12. 5. Is clear   I have personally seen and evaluated the patient, evaluated laboratory and imaging  results, formulated the assessment and plan and placed orders. The Patient requires high complexity decision making for assessment and support.  Case was discussed on Rounds with the Respiratory Therapy Staff  Allyne Gee, MD [facility] Pulmonary Critical Care Medicine Sleep Medicine

## 2018-12-22 ENCOUNTER — Other Ambulatory Visit (HOSPITAL_COMMUNITY): Payer: Self-pay

## 2018-12-22 NOTE — Progress Notes (Addendum)
Pulmonary Critical Care Medicine [place]   PULMONARY CRITICAL CARE SERVICE  PROGRESS NOTE  Date of Service: 12/22/2018  Robert Pham  EQA:[id number]  DOB: Apr 01, 1951   DOA: 12/14/2018  Referring Physician: Merton Border, MD  HPI: Robert Pham is a 68 y.o. male seen for follow up of Acute on Chronic Respiratory Failure.  Patient mains on aerosol trach collar 40% FiO2 trach changed to a #6 cuffless today.  Satting well with no distress.  Medications: Reviewed on Rounds  Physical Exam:  Vitals: Pulse 87 respiration 20 BP 161/87 O2 sat 100% temp 97.7  Ventilator Settings 40% ATC  . General: Comfortable at this time . Eyes: Grossly normal lids, irises & conjunctiva . ENT: grossly tongue is normal . Neck: no obvious mass . Cardiovascular: S1 S2 normal no gallop . Respiratory: No rales or rhonchi noted . Abdomen: soft . Skin: no rash seen on limited exam . Musculoskeletal: not rigid . Psychiatric:unable to assess . Neurologic: no seizure no involuntary movements         Lab Data:   Basic Metabolic Panel: Recent Labs  Lab 12/17/18 0737 12/18/18 0834 12/20/18 0736  NA 140  --  140  K 3.4* 4.2 4.0  CL 98  --  101  CO2 33*  --  31  GLUCOSE 121*  --  83  BUN 13  --  10  CREATININE 0.60*  --  0.80  CALCIUM 9.9  --  10.0  MG 2.0  --  1.9  PHOS 4.3  --   --     ABG: Recent Labs  Lab 12/17/18 1205  PHART 7.413  PCO2ART 47.6  PO2ART 64.5*  HCO3 30.2*  O2SAT 92.4    Liver Function Tests: No results for input(s): AST, ALT, ALKPHOS, BILITOT, PROT, ALBUMIN in the last 168 hours. No results for input(s): LIPASE, AMYLASE in the last 168 hours. No results for input(s): AMMONIA in the last 168 hours.  CBC: Recent Labs  Lab 12/17/18 0737 12/20/18 0736  WBC 7.0 5.8  HGB 10.0* 9.4*  HCT 33.5* 31.7*  MCV 88.6 88.1  PLT 308 306    Cardiac Enzymes: No results for input(s): CKTOTAL, CKMB, CKMBINDEX, TROPONINI in the last 168 hours.  BNP  (last 3 results) Recent Labs    11/28/18 2346  BNP 75.2    ProBNP (last 3 results) No results for input(s): PROBNP in the last 8760 hours.  Radiological Exams: No results found.  Assessment/Plan Active Problems:   Acute on chronic respiratory failure with hypoxia (HCC)   Obstructive chronic bronchitis with exacerbation (HCC)   GERD (gastroesophageal reflux disease)   Generalized anxiety disorder   Ventilator dependent ([place])   1. Acute on chronic respiratory failure with hypoxia patient is doing well on aerosol trach collar40% FiO2 will continue to wean as tolerated. Continue supportive measures and pulmonary toilet. 2. Severe COPD continue supportive measures and oxygen to keep patient sat above 88% 3. GERD asymptomatic at this time continue supportive care 4. Anxiety disorder stable primary care team to manage.   I have personally seen and evaluated the patient, evaluated laboratory and imaging results, formulated the assessment and plan and placed orders. The Patient requires high complexity decision making for assessment and support.  Case was discussed on Rounds with the Respiratory Therapy Staff  Allyne Gee, MD [facility] Pulmonary Critical Care Medicine Sleep Medicine

## 2018-12-23 NOTE — Progress Notes (Addendum)
Pulmonary Critical Care Medicine [place]   PULMONARY CRITICAL CARE SERVICE  PROGRESS NOTE  Date of Service: 12/23/2018  Robert Pham  YIR:[id number]  DOB: [birth year]   DOA: 12/14/2018  Referring Physician: Merton Border, MD  HPI: Robert Pham is a 68 y.o. male seen for follow up of Acute on Chronic Respiratory Failure.  Patient is on 40% aerosol trach collar satting well with no distress or fever at this time.  Medications: Reviewed on Rounds  Physical Exam:  Vitals: Pulse 94 respirations 26 BP 153/89 O2 sat 92% temp 96.5  Ventilator Settings ATC 40%  . General: Comfortable at this time . Eyes: Grossly normal lids, irises & conjunctiva . ENT: grossly tongue is normal . Neck: no obvious mass . Cardiovascular: S1 S2 normal no gallop . Respiratory: No rales or rhonchi noted . Abdomen: soft . Skin: no rash seen on limited exam . Musculoskeletal: not rigid . Psychiatric:unable to assess . Neurologic: no seizure no involuntary movements         Lab Data:   Basic Metabolic Panel: Recent Labs  Lab 12/17/18 0737 12/18/18 0834 12/20/18 0736  NA 140  --  140  K 3.4* 4.2 4.0  CL 98  --  101  CO2 33*  --  31  GLUCOSE 121*  --  83  BUN 13  --  10  CREATININE 0.60*  --  0.80  CALCIUM 9.9  --  10.0  MG 2.0  --  1.9  PHOS 4.3  --   --     ABG: Recent Labs  Lab 12/17/18 1205  PHART 7.413  PCO2ART 47.6  PO2ART 64.5*  HCO3 30.2*  O2SAT 92.4    Liver Function Tests: No results for input(s): AST, ALT, ALKPHOS, BILITOT, PROT, ALBUMIN in the last 168 hours. No results for input(s): LIPASE, AMYLASE in the last 168 hours. No results for input(s): AMMONIA in the last 168 hours.  CBC: Recent Labs  Lab 12/17/18 0737 12/20/18 0736  WBC 7.0 5.8  HGB 10.0* 9.4*  HCT 33.5* 31.7*  MCV 88.6 88.1  PLT 308 306    Cardiac Enzymes: No results for input(s): CKTOTAL, CKMB, CKMBINDEX, TROPONINI in the last 168 hours.  BNP (last 3 results) Recent  Labs    11/28/18 2346  BNP 75.2    ProBNP (last 3 results) No results for input(s): PROBNP in the last 8760 hours.  Radiological Exams: Dg Chest Port 1 View  Result Date: 12/22/2018 CLINICAL DATA:  Respiratory distress EXAM: PORTABLE CHEST 1 VIEW COMPARISON:  12/14/2018 FINDINGS: Tracheostomy is unchanged. Severe bullous emphysematous changes in the lungs. No acute confluent airspace opacities or effusions. Heart is normal size. IMPRESSION: Severe bullous emphysema.  No active disease. Electronically Signed   By: Rolm Baptise M.D.   On: 12/22/2018 20:21    Assessment/Plan Active Problems:   Acute on chronic respiratory failure with hypoxia (HCC)   Obstructive chronic bronchitis with exacerbation (HCC)   GERD (gastroesophageal reflux disease)   Generalized anxiety disorder   Ventilator dependent ([place])   1. Acute on chronic respiratory failure with hypoxia patient is doing well on aerosol trach collar40% FiO2 will continue to wean as tolerated. Continue supportive measures and pulmonary toilet. 2. Severe COPD continue supportive measures and oxygen to keep patient sat above 88% 3. GERD asymptomatic at this time continue supportive care 4. Anxiety disorder stable primary care team to manage.   I have personally seen and evaluated the patient, evaluated laboratory and imaging results, formulated  the assessment and plan and placed orders. The Patient requires high complexity decision making for assessment and support.  Case was discussed on Rounds with the Respiratory Therapy Staff  Allyne Gee, MD [facility] Pulmonary Critical Care Medicine Sleep Medicine

## 2018-12-24 LAB — CBC
HCT: 35.4 % — ABNORMAL LOW (ref 39.0–52.0)
Hemoglobin: 10.4 g/dL — ABNORMAL LOW (ref 13.0–17.0)
MCH: 25.9 pg — ABNORMAL LOW (ref 26.0–34.0)
MCHC: 29.4 g/dL — ABNORMAL LOW (ref 30.0–36.0)
MCV: 88.1 fL (ref 80.0–100.0)
Platelets: 396 10*3/uL (ref 150–400)
RBC: 4.02 MIL/uL — ABNORMAL LOW (ref 4.22–5.81)
RDW: 15.7 % — ABNORMAL HIGH (ref 11.5–15.5)
WBC: 6.3 10*3/uL (ref 4.0–10.5)
nRBC: 0 % (ref 0.0–0.2)

## 2018-12-24 LAB — BASIC METABOLIC PANEL
Anion gap: 10 (ref 5–15)
BUN: 13 mg/dL (ref 8–23)
CO2: 28 mmol/L (ref 22–32)
Calcium: 10.1 mg/dL (ref 8.9–10.3)
Chloride: 101 mmol/L (ref 98–111)
Creatinine, Ser: 0.69 mg/dL (ref 0.61–1.24)
GFR calc Af Amer: >60 mL/min (ref >60)
GFR calc non Af Amer: >60 mL/min (ref >60)
Glucose, Bld: 97 mg/dL (ref 70–99)
Potassium: 3.8 mmol/L (ref 3.5–5.1)
Sodium: 139 mmol/L (ref 135–145)

## 2018-12-24 LAB — BLOOD GAS, ARTERIAL
Acid-Base Excess: 4.2 mmol/L — ABNORMAL HIGH (ref 0.0–2.0)
Bicarbonate: 28.9 mmol/L — ABNORMAL HIGH (ref 20.0–28.0)
FIO2: 40
O2 Saturation: 92.8 %
Patient temperature: 98.6
pCO2 arterial: 49.1 mmHg — ABNORMAL HIGH (ref 32.0–48.0)
pH, Arterial: 7.388 (ref 7.350–7.450)
pO2, Arterial: 69.6 mmHg — ABNORMAL LOW (ref 83.0–108.0)

## 2018-12-24 LAB — MAGNESIUM: Magnesium: 1.9 mg/dL (ref 1.7–2.4)

## 2018-12-24 NOTE — Progress Notes (Addendum)
Pulmonary Critical Care Medicine [place]   PULMONARY CRITICAL CARE SERVICE  PROGRESS NOTE  Date of Service: 12/24/2018  Robert Pham  RCV:[id number]  DOB: 09-22-1950   DOA: 12/14/2018  Referring Physician: Merton Border, MD  HPI: Robert Pham is a 69 y.o. male seen for follow up of Acute on Chronic Respiratory Failure.  Patient continues on 35% is on trach collar satting well with no distress.    Medications: Reviewed on Rounds  Physical Exam:  Vitals: Pulse 94 respirations 18 BP 115/84 O2 sat 98% temp 97.9  Ventilator Settings ATC 35%  . General: Comfortable at this time . Eyes: Grossly normal lids, irises & conjunctiva . ENT: grossly tongue is normal . Neck: no obvious mass . Cardiovascular: S1 S2 normal no gallop . Respiratory: No rales or rhonchi noted . Abdomen: soft . Skin: no rash seen on limited exam . Musculoskeletal: not rigid . Psychiatric:unable to assess . Neurologic: no seizure no involuntary movements         Lab Data:   Basic Metabolic Panel: Recent Labs  Lab 12/18/18 0834 12/20/18 0736 12/24/18 0551  NA  --  140 139  K 4.2 4.0 3.8  CL  --  101 101  CO2  --  31 28  GLUCOSE  --  83 97  BUN  --  10 13  CREATININE  --  0.80 0.69  CALCIUM  --  10.0 10.1  MG  --  1.9 1.9    ABG: Recent Labs  Lab 12/23/18 2357  PHART 7.388  PCO2ART 49.1*  PO2ART 69.6*  HCO3 28.9*  O2SAT 92.8    Liver Function Tests: No results for input(s): AST, ALT, ALKPHOS, BILITOT, PROT, ALBUMIN in the last 168 hours. No results for input(s): LIPASE, AMYLASE in the last 168 hours. No results for input(s): AMMONIA in the last 168 hours.  CBC: Recent Labs  Lab 12/20/18 0736 12/24/18 0551  WBC 5.8 6.3  HGB 9.4* 10.4*  HCT 31.7* 35.4*  MCV 88.1 88.1  PLT 306 396    Cardiac Enzymes: No results for input(s): CKTOTAL, CKMB, CKMBINDEX, TROPONINI in the last 168 hours.  BNP (last 3 results) Recent Labs    11/28/18 2346  BNP 75.2     ProBNP (last 3 results) No results for input(s): PROBNP in the last 8760 hours.  Radiological Exams: Dg Chest Port 1 View  Result Date: 12/22/2018 CLINICAL DATA:  Respiratory distress EXAM: PORTABLE CHEST 1 VIEW COMPARISON:  12/14/2018 FINDINGS: Tracheostomy is unchanged. Severe bullous emphysematous changes in the lungs. No acute confluent airspace opacities or effusions. Heart is normal size. IMPRESSION: Severe bullous emphysema.  No active disease. Electronically Signed   By: Rolm Baptise M.D.   On: 12/22/2018 20:21    Assessment/Plan Active Problems:   Acute on chronic respiratory failure with hypoxia (HCC)   Obstructive chronic bronchitis with exacerbation (HCC)   GERD (gastroesophageal reflux disease)   Generalized anxiety disorder   Ventilator dependent ([place])   1. Acute on chronic respiratory failure with hypoxia patient is doing well on aerosol trach collar35% FiO2 will continue to wean as tolerated. Continue supportive measures and pulmonary toilet. 2. Severe COPD continue supportive measures and oxygen to keep patient sat above 88% 3. GERD asymptomatic at this time continue supportive care 4. Anxiety disorder stable primary care team to manage.   I have personally seen and evaluated the patient, evaluated laboratory and imaging results, formulated the assessment and plan and placed orders. The Patient requires high  complexity decision making for assessment and support.  Case was discussed on Rounds with the Respiratory Therapy Staff  Allyne Gee, MD [facility] Pulmonary Critical Care Medicine Sleep Medicine

## 2018-12-25 ENCOUNTER — Other Ambulatory Visit (HOSPITAL_COMMUNITY): Payer: Self-pay

## 2018-12-25 LAB — BLOOD GAS, ARTERIAL
Acid-Base Excess: 2.4 mmol/L — ABNORMAL HIGH (ref 0.0–2.0)
Bicarbonate: 27.3 mmol/L (ref 20.0–28.0)
FIO2: 30
O2 Saturation: 94.4 %
Patient temperature: 96.7
pCO2 arterial: 46.6 mmHg (ref 32.0–48.0)
pH, Arterial: 7.38 (ref 7.350–7.450)
pO2, Arterial: 72.7 mmHg — ABNORMAL LOW (ref 83.0–108.0)

## 2018-12-25 MED ORDER — IOHEXOL 300 MG/ML  SOLN
30.0000 mL | Freq: Once | INTRAMUSCULAR | Status: AC | PRN
  Administered 2018-12-25: 30 mL

## 2018-12-25 NOTE — Progress Notes (Addendum)
Pulmonary Critical Care Medicine [place]   PULMONARY CRITICAL CARE SERVICE  PROGRESS NOTE  Date of Service: 12/25/2018  Kolbee Bogusz  IEP:[id number]  DOB: 02-11-1951   DOA: 12/14/2018  Referring Physician: Merton Border, MD  HPI: Robert Pham is a 68 y.o. male seen for follow up of Acute on Chronic Respiratory Failure.  Patient remains on aerosol trach collar 35% FiO2 satting well with no fever or distress noted.  Medications: Reviewed on Rounds  Physical Exam:  Vitals: Pulse 98 respirations 24 BP 147/93 O2 sat 96% 6.6  Ventilator Settings ATC 35%  . General: Comfortable at this time . Eyes: Grossly normal lids, irises & conjunctiva . ENT: grossly tongue is normal . Neck: no obvious mass . Cardiovascular: S1 S2 normal no gallop . Respiratory: No rales or rhonchi noted . Abdomen: soft . Skin: no rash seen on limited exam . Musculoskeletal: not rigid . Psychiatric:unable to assess . Neurologic: no seizure no involuntary movements         Lab Data:   Basic Metabolic Panel: Recent Labs  Lab 12/20/18 0736 12/24/18 0551  NA 140 139  K 4.0 3.8  CL 101 101  CO2 31 28  GLUCOSE 83 97  BUN 10 13  CREATININE 0.80 0.69  CALCIUM 10.0 10.1  MG 1.9 1.9    ABG: Recent Labs  Lab 12/23/18 2357 12/25/18 1735  PHART 7.388 7.380  PCO2ART 49.1* 46.6  PO2ART 69.6* 72.7*  HCO3 28.9* 27.3  O2SAT 92.8 94.4    Liver Function Tests: No results for input(s): AST, ALT, ALKPHOS, BILITOT, PROT, ALBUMIN in the last 168 hours. No results for input(s): LIPASE, AMYLASE in the last 168 hours. No results for input(s): AMMONIA in the last 168 hours.  CBC: Recent Labs  Lab 12/20/18 0736 12/24/18 0551  WBC 5.8 6.3  HGB 9.4* 10.4*  HCT 31.7* 35.4*  MCV 88.1 88.1  PLT 306 396    Cardiac Enzymes: No results for input(s): CKTOTAL, CKMB, CKMBINDEX, TROPONINI in the last 168 hours.  BNP (last 3 results) Recent Labs    11/28/18 2346  BNP 75.2     ProBNP (last 3 results) No results for input(s): PROBNP in the last 8760 hours.  Radiological Exams: No results found.  Assessment/Plan Active Problems:   Acute on chronic respiratory failure with hypoxia (HCC)   Obstructive chronic bronchitis with exacerbation (HCC)   GERD (gastroesophageal reflux disease)   Generalized anxiety disorder   Ventilator dependent ([place])   1. Acute on chronic respiratory failure with hypoxia patient is doing well on aerosol trach collar35% FiO2 will continue to wean as tolerated. Continue supportive measures and pulmonary toilet. 2. Severe COPD continue supportive measures and oxygen to keep patient sat above 88% 3. GERD asymptomatic at this time continue supportive care 4. Anxiety disorder stable primary care team to manage.   I have personally seen and evaluated the patient, evaluated laboratory and imaging results, formulated the assessment and plan and placed orders. The Patient requires high complexity decision making for assessment and support.  Case was discussed on Rounds with the Respiratory Therapy Staff  Allyne Gee, MD [facility] Pulmonary Critical Care Medicine Sleep Medicine

## 2018-12-26 ENCOUNTER — Other Ambulatory Visit (HOSPITAL_COMMUNITY): Payer: Self-pay

## 2018-12-26 ENCOUNTER — Encounter (HOSPITAL_COMMUNITY): Payer: Self-pay | Admitting: Interventional Radiology

## 2018-12-26 HISTORY — PX: IR REPLC GASTRO/COLONIC TUBE PERCUT W/FLUORO: IMG2333

## 2018-12-26 MED ORDER — LIDOCAINE VISCOUS HCL 2 % MT SOLN
OROMUCOSAL | Status: AC
  Filled 2018-12-26: qty 15

## 2018-12-26 MED ORDER — IOHEXOL 300 MG/ML  SOLN
50.0000 mL | Freq: Once | INTRAMUSCULAR | Status: AC | PRN
  Administered 2018-12-26: 16:00:00 10 mL

## 2018-12-26 NOTE — Progress Notes (Signed)
Pulmonary Critical Care Medicine [place]   PULMONARY CRITICAL CARE SERVICE  PROGRESS NOTE  Date of Service: 12/26/2018  Robert Pham  WER:[id number]  DOB: [birth year]   DOA: 12/14/2018  Referring Physician: Merton Border, MD  HPI: Robert Pham is a 68 y.o. male seen for follow up of Acute on Chronic Respiratory Failure.  Patient currently is on T collar oxygen requirements were increased overnight to 60% however saturations are excellent.  Spoke with respiratory therapy on rounds and suggested to go ahead and decrease the oxygen  Medications: Reviewed on Rounds  Physical Exam:  Vitals: Temperature 98.7 pulse 86 respiratory rate 17 blood pressure 118/69 saturations are 97%  Ventilator Settings currently off the ventilator on T collar has been on 60% we will decrease that further  . General: Comfortable at this time . Eyes: Grossly normal lids, irises & conjunctiva . ENT: grossly tongue is normal . Neck: no obvious mass . Cardiovascular: S1 S2 normal no gallop . Respiratory: No rhonchi no rales are noted . Abdomen: soft . Skin: no rash seen on limited exam . Musculoskeletal: not rigid . Psychiatric:unable to assess . Neurologic: no seizure no involuntary movements         Lab Data:   Basic Metabolic Panel: Recent Labs  Lab 12/20/18 0736 12/24/18 0551  NA 140 139  K 4.0 3.8  CL 101 101  CO2 31 28  GLUCOSE 83 97  BUN 10 13  CREATININE 0.80 0.69  CALCIUM 10.0 10.1  MG 1.9 1.9    ABG: Recent Labs  Lab 12/23/18 2357 12/25/18 1735  PHART 7.388 7.380  PCO2ART 49.1* 46.6  PO2ART 69.6* 72.7*  HCO3 28.9* 27.3  O2SAT 92.8 94.4    Liver Function Tests: No results for input(s): AST, ALT, ALKPHOS, BILITOT, PROT, ALBUMIN in the last 168 hours. No results for input(s): LIPASE, AMYLASE in the last 168 hours. No results for input(s): AMMONIA in the last 168 hours.  CBC: Recent Labs  Lab 12/20/18 0736 12/24/18 0551  WBC 5.8 6.3  HGB 9.4*  10.4*  HCT 31.7* 35.4*  MCV 88.1 88.1  PLT 306 396    Cardiac Enzymes: No results for input(s): CKTOTAL, CKMB, CKMBINDEX, TROPONINI in the last 168 hours.  BNP (last 3 results) Recent Labs    11/28/18 2346  BNP 75.2    ProBNP (last 3 results) No results for input(s): PROBNP in the last 8760 hours.  Radiological Exams: Ir Replc Gastro/colonic Tube Percut W/fluoro  Result Date: 12/26/2018 INDICATION: Inadvertent removal of gastrostomy catheter with temporary Foley catheter placed through the tract. EXAM: GASTROSTOMY CATHETER REPLACEMENT MEDICATIONS: None indicated ANESTHESIA/SEDATION: None required CONTRAST:  32mL OMNIPAQUE IOHEXOL 300 MG/ML SOLN - administered into the gastric lumen. FLUOROSCOPY TIME:  Fluoroscopy Time:  minutes  seconds ( mGy). COMPLICATIONS: None immediate. PROCEDURE: Informed written consent was obtained from the patient after a thorough discussion of the procedural risks, benefits and alternatives. All questions were addressed. Maximal Sterile Barrier Technique was utilized including caps, mask, sterile gowns, sterile gloves, sterile drape, hand hygiene and skin antiseptic. A timeout was performed prior to the initiation of the procedure. The Foley catheter was removed. A new 24 French balloon retention gastrostomy catheter was easily advanced through the tract into the gastric lumen. Retention balloon inflated with 7 mL sterile saline. Contrast injection confirms appropriate intraluminal positioning. No extravasation. IMPRESSION: 1. Technically successful replacement of 24 French balloon retention gastrostomy catheter under fluoroscopy. Okay for routine use. Electronically Signed   By: Keturah Barre  Deanne CofferHassell M.D.   On: 12/26/2018 15:59   Dg Abdomen Peg Tube Location  Result Date: 12/25/2018 CLINICAL DATA:  Peg tube placement through Foley catheter EXAM: ABDOMEN - 1 VIEW COMPARISON:  12/15/2018 FINDINGS: AP upright view of the abdomen following administration of 30 cc of Isovue  through Foley catheter at PEG tube site. Contrast opacifies the stomach. No gross extravasation. Emphysematous disease and scarring at the bilateral lung bases IMPRESSION: Enteral catheter within the proximal stomach. No gross extravasation of contrast. Electronically Signed   By: Jasmine PangKim  Fujinaga M.D.   On: 12/25/2018 18:49    Assessment/Plan Active Problems:   Acute on chronic respiratory failure with hypoxia (HCC)   Obstructive chronic bronchitis with exacerbation (HCC)   GERD (gastroesophageal reflux disease)   Generalized anxiety disorder   Ventilator dependent (HCC)   1. Acute on chronic respiratory failure with hypoxia we will continue with T collar trials as already noted above several times wean FiO2 discussed on rounds with respiratory therapy 2. COPD patient is at baseline we will continue present management 3. GERD at baseline we will follow along 4. Generalized anxiety controlled 5. Is right now is off the vent   I have personally seen and evaluated the patient, evaluated laboratory and imaging results, formulated the assessment and plan and placed orders. The Patient requires high complexity decision making for assessment and support.  Case was discussed on Rounds with the Respiratory Therapy Staff  Yevonne PaxSaadat A Briggett Tuccillo, MD [facility] Pulmonary Critical Care Medicine Sleep Medicine

## 2018-12-27 LAB — CBC
HCT: 35.6 % — ABNORMAL LOW (ref 39.0–52.0)
Hemoglobin: 10.6 g/dL — ABNORMAL LOW (ref 13.0–17.0)
MCH: 25.9 pg — ABNORMAL LOW (ref 26.0–34.0)
MCHC: 29.8 g/dL — ABNORMAL LOW (ref 30.0–36.0)
MCV: 87 fL (ref 80.0–100.0)
Platelets: 351 10*3/uL (ref 150–400)
RBC: 4.09 MIL/uL — ABNORMAL LOW (ref 4.22–5.81)
RDW: 15.8 % — ABNORMAL HIGH (ref 11.5–15.5)
WBC: 5.8 10*3/uL (ref 4.0–10.5)
nRBC: 0 % (ref 0.0–0.2)

## 2018-12-27 LAB — BASIC METABOLIC PANEL
Anion gap: 15 (ref 5–15)
BUN: 21 mg/dL (ref 8–23)
CO2: 25 mmol/L (ref 22–32)
Calcium: 9.6 mg/dL (ref 8.9–10.3)
Chloride: 101 mmol/L (ref 98–111)
Creatinine, Ser: 0.95 mg/dL (ref 0.61–1.24)
GFR calc Af Amer: >60 mL/min (ref >60)
GFR calc non Af Amer: >60 mL/min (ref >60)
Glucose, Bld: 101 mg/dL — ABNORMAL HIGH (ref 70–99)
Potassium: 3.4 mmol/L — ABNORMAL LOW (ref 3.5–5.1)
Sodium: 141 mmol/L (ref 135–145)

## 2018-12-27 LAB — PHOSPHORUS: Phosphorus: 3.7 mg/dL (ref 2.5–4.6)

## 2018-12-27 LAB — MAGNESIUM: Magnesium: 2 mg/dL (ref 1.7–2.4)

## 2018-12-27 NOTE — Progress Notes (Signed)
[facility]   PULMONARY CRITICAL CARE SERVICE  PROGRESS NOTE  Date of Service: 12/27/2018  Robert Pham  UEA:[id number]  DOB: 1951-03-20   DOA: 12/14/2018  Referring Physician: Carron CurieAli Hijazi, MD  HPI: Robert Pham is a 68 y.o. male seen for follow up of Acute on Chronic Respiratory Failure.  Remains off the ventilator on T collar right now is on 40% FiO2  Medications: Reviewed on Rounds  Physical Exam:  Vitals: Temperature 97.7 pulse 82 respiratory rate 22 blood pressure is 148/83 saturations 90%  Ventilator Settings on T collar right now on 40% FiO2  . General: Comfortable at this time . Eyes: Grossly normal lids, irises & conjunctiva . ENT: grossly tongue is normal . Neck: no obvious mass . Cardiovascular: S1 S2 normal no gallop . Respiratory: No rhonchi no rales are noted . Abdomen: soft . Skin: no rash seen on limited exam . Musculoskeletal: not rigid . Psychiatric:unable to assess . Neurologic: no seizure no involuntary movements         Lab Data:   Basic Metabolic Panel: Recent Labs  Lab 12/24/18 0551 12/27/18 0847  NA 139 141  K 3.8 3.4*  CL 101 101  CO2 28 25  GLUCOSE 97 101*  BUN 13 21  CREATININE 0.69 0.95  CALCIUM 10.1 9.6  MG 1.9 2.0  PHOS  --  3.7    ABG: Recent Labs  Lab 12/23/18 2357 12/25/18 1735  PHART 7.388 7.380  PCO2ART 49.1* 46.6  PO2ART 69.6* 72.7*  HCO3 28.9* 27.3  O2SAT 92.8 94.4    Liver Function Tests: No results for input(s): AST, ALT, ALKPHOS, BILITOT, PROT, ALBUMIN in the last 168 hours. No results for input(s): LIPASE, AMYLASE in the last 168 hours. No results for input(s): AMMONIA in the last 168 hours.  CBC: Recent Labs  Lab 12/24/18 0551 12/27/18 0847  WBC 6.3 5.8  HGB 10.4* 10.6*  HCT 35.4* 35.6*  MCV 88.1 87.0  PLT 396 351    Cardiac Enzymes: No results for input(s): CKTOTAL, CKMB, CKMBINDEX, TROPONINI in the last 168 hours.  BNP (last 3  results) Recent Labs    11/28/18 2346  BNP 75.2    ProBNP (last 3 results) No results for input(s): PROBNP in the last 8760 hours.  Radiological Exams: Ir Replc Gastro/colonic Tube Percut W/fluoro  Result Date: 12/26/2018 INDICATION: Inadvertent removal of gastrostomy catheter with temporary Foley catheter placed through the tract. EXAM: GASTROSTOMY CATHETER REPLACEMENT MEDICATIONS: None indicated ANESTHESIA/SEDATION: None required CONTRAST:  10mL OMNIPAQUE IOHEXOL 300 MG/ML SOLN - administered into the gastric lumen. FLUOROSCOPY TIME:  Fluoroscopy Time:  minutes  seconds ( mGy). COMPLICATIONS: None immediate. PROCEDURE: Informed written consent was obtained from the patient after a thorough discussion of the procedural risks, benefits and alternatives. All questions were addressed. Maximal Sterile Barrier Technique was utilized including caps, mask, sterile gowns, sterile gloves, sterile drape, hand hygiene and skin antiseptic. A timeout was performed prior to the initiation of the procedure. The Foley catheter was removed. A new 24 French balloon retention gastrostomy catheter was easily advanced through the tract into the gastric lumen. Retention balloon inflated with 7 mL sterile saline. Contrast injection confirms appropriate intraluminal positioning. No extravasation. IMPRESSION: 1. Technically successful replacement of 24 French balloon retention gastrostomy catheter under fluoroscopy. Okay for routine use. Electronically Signed   By: Corlis Leak  Hassell M.D.   On: 12/26/2018 15:59   Dg Abdomen Peg Tube Location  Result Date: 12/25/2018 CLINICAL DATA:  Peg  tube placement through Foley catheter EXAM: ABDOMEN - 1 VIEW COMPARISON:  12/15/2018 FINDINGS: AP upright view of the abdomen following administration of 30 cc of Isovue through Foley catheter at PEG tube site. Contrast opacifies the stomach. No gross extravasation. Emphysematous disease and scarring at the bilateral lung bases IMPRESSION: Enteral  catheter within the proximal stomach. No gross extravasation of contrast. Electronically Signed   By: Donavan Foil M.D.   On: 12/25/2018 18:49    Assessment/Plan Active Problems:   Acute on chronic respiratory failure with hypoxia (HCC)   Obstructive chronic bronchitis with exacerbation (HCC)   GERD (gastroesophageal reflux disease)   Generalized anxiety disorder   Ventilator dependent ([place])   1. Acute on chronic respiratory failure hypoxia we will continue with T collar on 40% titrate oxygen down as tolerated 2. COPD advanced disease we will continue with present management 3. GERD at baseline 4. Anxiety disorder controlled 5. Dependence off the ventilator now   I have personally seen and evaluated the patient, evaluated laboratory and imaging results, formulated the assessment and plan and placed orders. The Patient requires high complexity decision making for assessment and support.  Case was discussed on Rounds with the Respiratory Therapy Staff  Allyne Gee, MD [facility] Pulmonary Critical Care Medicine Sleep Medicine

## 2018-12-28 LAB — POTASSIUM: Potassium: 3.5 mmol/L (ref 3.5–5.1)

## 2018-12-28 NOTE — Progress Notes (Addendum)
Pulmonary Critical Care Medicine [place]   PULMONARY CRITICAL CARE SERVICE  PROGRESS NOTE  Date of Service: 12/28/2018  Robert Pham  SNK:[id number]  DOB: January 05, 1951   DOA: 12/14/2018  Referring Physician: Merton Border, MD  HPI: Robert Pham is a 68 y.o. male seen for follow up of Acute on Chronic Respiratory Failure.  Patient remains on aerosol trach collar 40% FiO2 using PMV without difficulty.  Satting well no distress.  Medications: Reviewed on Rounds  Physical Exam:  Vitals: Pulse 100 respiration 24 BP 176/57 O2 sat 96% temp 97.4  Ventilator Settings 40% ATC  . General: Comfortable at this time . Eyes: Grossly normal lids, irises & conjunctiva . ENT: grossly tongue is normal . Neck: no obvious mass . Cardiovascular: S1 S2 normal no gallop . Respiratory: No rales or rhonchi noted . Abdomen: soft . Skin: no rash seen on limited exam . Musculoskeletal: not rigid . Psychiatric:unable to assess . Neurologic: no seizure no involuntary movements         Lab Data:   Basic Metabolic Panel: Recent Labs  Lab 12/24/18 0551 12/27/18 0847 12/28/18 0714  NA 139 141  --   K 3.8 3.4* 3.5  CL 101 101  --   CO2 28 25  --   GLUCOSE 97 101*  --   BUN 13 21  --   CREATININE 0.69 0.95  --   CALCIUM 10.1 9.6  --   MG 1.9 2.0  --   PHOS  --  3.7  --     ABG: Recent Labs  Lab 12/23/18 2357 12/25/18 1735  PHART 7.388 7.380  PCO2ART 49.1* 46.6  PO2ART 69.6* 72.7*  HCO3 28.9* 27.3  O2SAT 92.8 94.4    Liver Function Tests: No results for input(s): AST, ALT, ALKPHOS, BILITOT, PROT, ALBUMIN in the last 168 hours. No results for input(s): LIPASE, AMYLASE in the last 168 hours. No results for input(s): AMMONIA in the last 168 hours.  CBC: Recent Labs  Lab 12/24/18 0551 12/27/18 0847  WBC 6.3 5.8  HGB 10.4* 10.6*  HCT 35.4* 35.6*  MCV 88.1 87.0  PLT 396 351    Cardiac Enzymes: No results for input(s): CKTOTAL, CKMB, CKMBINDEX, TROPONINI  in the last 168 hours.  BNP (last 3 results) Recent Labs    11/28/18 2346  BNP 75.2    ProBNP (last 3 results) No results for input(s): PROBNP in the last 8760 hours.  Radiological Exams: No results found.  Assessment/Plan Active Problems:   Acute on chronic respiratory failure with hypoxia (HCC)   Obstructive chronic bronchitis with exacerbation (HCC)   GERD (gastroesophageal reflux disease)   Generalized anxiety disorder   Ventilator dependent ([place])   1. Acute on chronic respiratory failure hypoxia we will continue with T collar on 40% titrate oxygen down as tolerated 2. COPD advanced disease we will continue with present management 3. GERD at baseline 4. Anxiety disorder controlled 5. Dependence off the ventilator now   I have personally seen and evaluated the patient, evaluated laboratory and imaging results, formulated the assessment and plan and placed orders. The Patient requires high complexity decision making for assessment and support.  Case was discussed on Rounds with the Respiratory Therapy Staff  Allyne Gee, MD [facility] Pulmonary Critical Care Medicine Sleep Medicine

## 2018-12-29 NOTE — Progress Notes (Signed)
Pulmonary Critical Care Medicine [place]   PULMONARY CRITICAL CARE SERVICE  PROGRESS NOTE  Date of Service: 12/29/2018  Robert Pham  NGE:[id number]  DOB: 12-12-1950   DOA: 12/14/2018  Referring Physician: Merton Border, MD  HPI: Robert Pham is a 68 y.o. male seen for follow up of Acute on Chronic Respiratory Failure.  Patient currently is on T collar good saturations  Medications: Reviewed on Rounds  Physical Exam:  Vitals: Temperature 97.7 pulse 74 respiratory 18 blood pressure 131/72 saturations 100%  Ventilator Settings on T collar right now 35% FiO2  . General: Comfortable at this time . Eyes: Grossly normal lids, irises & conjunctiva . ENT: grossly tongue is normal . Neck: no obvious mass . Cardiovascular: S1 S2 normal no gallop . Respiratory: No rhonchi no rales are noted at this time . Abdomen: soft . Skin: no rash seen on limited exam . Musculoskeletal: not rigid . Psychiatric:unable to assess . Neurologic: no seizure no involuntary movements         Lab Data:   Basic Metabolic Panel: Recent Labs  Lab 12/24/18 0551 12/27/18 0847 12/28/18 0714  NA 139 141  --   K 3.8 3.4* 3.5  CL 101 101  --   CO2 28 25  --   GLUCOSE 97 101*  --   BUN 13 21  --   CREATININE 0.69 0.95  --   CALCIUM 10.1 9.6  --   MG 1.9 2.0  --   PHOS  --  3.7  --     ABG: Recent Labs  Lab 12/23/18 2357 12/25/18 1735  PHART 7.388 7.380  PCO2ART 49.1* 46.6  PO2ART 69.6* 72.7*  HCO3 28.9* 27.3  O2SAT 92.8 94.4    Liver Function Tests: No results for input(s): AST, ALT, ALKPHOS, BILITOT, PROT, ALBUMIN in the last 168 hours. No results for input(s): LIPASE, AMYLASE in the last 168 hours. No results for input(s): AMMONIA in the last 168 hours.  CBC: Recent Labs  Lab 12/24/18 0551 12/27/18 0847  WBC 6.3 5.8  HGB 10.4* 10.6*  HCT 35.4* 35.6*  MCV 88.1 87.0  PLT 396 351    Cardiac Enzymes: No results for input(s): CKTOTAL, CKMB, CKMBINDEX,  TROPONINI in the last 168 hours.  BNP (last 3 results) Recent Labs    11/28/18 2346  BNP 75.2    ProBNP (last 3 results) No results for input(s): PROBNP in the last 8760 hours.  Radiological Exams: No results found.  Assessment/Plan Active Problems:   Acute on chronic respiratory failure with hypoxia (HCC)   Obstructive chronic bronchitis with exacerbation (HCC)   GERD (gastroesophageal reflux disease)   Generalized anxiety disorder   Ventilator dependent ([place])   1. Acute on chronic respiratory failure with hypoxia we will continue with T collar trials patient has been on 35% FiO2 should be able to wean down as saturations are excellent 2. COPD at baseline we will continue supportive care 3. GERD at baseline we will continue present management 4. Generalized anxiety disorder right now seems to be stable 5. Vent dependence liberated from the ventilator   I have personally seen and evaluated the patient, evaluated laboratory and imaging results, formulated the assessment and plan and placed orders. The Patient requires high complexity decision making for assessment and support.  Case was discussed on Rounds with the Respiratory Therapy Staff  Allyne Gee, MD [facility] Pulmonary Critical Care Medicine Sleep Medicine

## 2018-12-30 NOTE — Progress Notes (Signed)
Pulmonary Critical Care Medicine [place]   PULMONARY CRITICAL CARE SERVICE  PROGRESS NOTE  Date of Service: 12/30/2018  Robert Pham  OXB:[id number]  DOB: 10-Dec-1950   DOA: 12/14/2018  Referring Physician: Merton Border, MD  HPI: Robert Pham is a 68 y.o. male seen for follow up of Acute on Chronic Respiratory Failure.  Currently patient is on T collar has been on 35% FiO2 good saturations are noted  Medications: Reviewed on Rounds  Physical Exam:  Vitals: Temperature 98.1 pulse 73 respiratory rate 26 blood pressure is 128/76 saturations 97%  Ventilator Settings T collar 35% FiO2  . General: Comfortable at this time . Eyes: Grossly normal lids, irises & conjunctiva . ENT: grossly tongue is normal . Neck: no obvious mass . Cardiovascular: S1 S2 normal no gallop . Respiratory: No rhonchi no rales . Abdomen: soft . Skin: no rash seen on limited exam . Musculoskeletal: not rigid . Psychiatric:unable to assess . Neurologic: no seizure no involuntary movements         Lab Data:   Basic Metabolic Panel: Recent Labs  Lab 12/24/18 0551 12/27/18 0847 12/28/18 0714  NA 139 141  --   K 3.8 3.4* 3.5  CL 101 101  --   CO2 28 25  --   GLUCOSE 97 101*  --   BUN 13 21  --   CREATININE 0.69 0.95  --   CALCIUM 10.1 9.6  --   MG 1.9 2.0  --   PHOS  --  3.7  --     ABG: Recent Labs  Lab 12/23/18 2357 12/25/18 1735  PHART 7.388 7.380  PCO2ART 49.1* 46.6  PO2ART 69.6* 72.7*  HCO3 28.9* 27.3  O2SAT 92.8 94.4    Liver Function Tests: No results for input(s): AST, ALT, ALKPHOS, BILITOT, PROT, ALBUMIN in the last 168 hours. No results for input(s): LIPASE, AMYLASE in the last 168 hours. No results for input(s): AMMONIA in the last 168 hours.  CBC: Recent Labs  Lab 12/24/18 0551 12/27/18 0847  WBC 6.3 5.8  HGB 10.4* 10.6*  HCT 35.4* 35.6*  MCV 88.1 87.0  PLT 396 351    Cardiac Enzymes: No results for input(s): CKTOTAL, CKMB, CKMBINDEX,  TROPONINI in the last 168 hours.  BNP (last 3 results) Recent Labs    11/28/18 2346  BNP 75.2    ProBNP (last 3 results) No results for input(s): PROBNP in the last 8760 hours.  Radiological Exams: No results found.  Assessment/Plan Active Problems:   Acute on chronic respiratory failure with hypoxia (HCC)   Obstructive chronic bronchitis with exacerbation (HCC)   GERD (gastroesophageal reflux disease)   Generalized anxiety disorder   Ventilator dependent ([place])   1. Acute on chronic respiratory failure with hypoxia we will continue with T collar trials continue secretion management supportive care. 2. COPD at baseline we will continue with supportive care 3. GERD at baseline we will continue present management 4. Generalized anxiety disorder patient doing better 5. Vent dependent liberated from the ventilator   I have personally seen and evaluated the patient, evaluated laboratory and imaging results, formulated the assessment and plan and placed orders. The Patient requires high complexity decision making for assessment and support.  Case was discussed on Rounds with the Respiratory Therapy Staff  Allyne Gee, MD [facility] Pulmonary Critical Care Medicine Sleep Medicine

## 2018-12-31 NOTE — Progress Notes (Addendum)
Pulmonary Critical Care Medicine [place]   PULMONARY CRITICAL CARE SERVICE  PROGRESS NOTE  Date of Service: 12/31/2018  Robert Pham  [id number]  DOB: 1950-12-05   DOA: 12/14/2018  Referring Physician: Merton Border, MD  HPI: Robert Pham is a 68 y.o. male seen for follow up of Acute on Chronic Respiratory Failure.  Patient remains on aerosol trach collar 40% FiO2 satting well with no fever or distress noted.  Medications: Reviewed on Rounds  Physical Exam:  Vitals: Pulse 85 respirations 18 BP 96/70 O2 sat 100% 96.9  Ventilator Settings ATC 40%  . General: Comfortable at this time . Eyes: Grossly normal lids, irises & conjunctiva . ENT: grossly tongue is normal . Neck: no obvious mass . Cardiovascular: S1 S2 normal no gallop . Respiratory: No rales or rhonchi noted . Abdomen: soft . Skin: no rash seen on limited exam . Musculoskeletal: not rigid . Psychiatric:unable to assess . Neurologic: no seizure no involuntary movements         Lab Data:   Basic Metabolic Panel: Recent Labs  Lab 12/27/18 0847 12/28/18 0714  NA 141  --   K 3.4* 3.5  CL 101  --   CO2 25  --   GLUCOSE 101*  --   BUN 21  --   CREATININE 0.95  --   CALCIUM 9.6  --   MG 2.0  --   PHOS 3.7  --     ABG: Recent Labs  Lab 12/25/18 1735  PHART 7.380  PCO2ART 46.6  PO2ART 72.7*  HCO3 27.3  O2SAT 94.4    Liver Function Tests: No results for input(s): AST, ALT, ALKPHOS, BILITOT, PROT, ALBUMIN in the last 168 hours. No results for input(s): LIPASE, AMYLASE in the last 168 hours. No results for input(s): AMMONIA in the last 168 hours.  CBC: Recent Labs  Lab 12/27/18 0847  WBC 5.8  HGB 10.6*  HCT 35.6*  MCV 87.0  PLT 351    Cardiac Enzymes: No results for input(s): CKTOTAL, CKMB, CKMBINDEX, TROPONINI in the last 168 hours.  BNP (last 3 results) Recent Labs    11/28/18 2346  BNP 75.2    ProBNP (last 3 results) No results for input(s): PROBNP in  the last 8760 hours.  Radiological Exams: No results found.  Assessment/Plan Active Problems:   Acute on chronic respiratory failure with hypoxia (HCC)   Obstructive chronic bronchitis with exacerbation (HCC)   GERD (gastroesophageal reflux disease)   Generalized anxiety disorder   Ventilator dependent ([place])   1. Acute on chronic respiratory failure with hypoxia we will continue with T collar trials continue secretion management supportive care. 2. COPD at baseline we will continue with supportive care 3. GERD at baseline we will continue present management 4. Generalized anxiety disorder patient doing better 5. Vent dependent liberated from the ventilator   I have personally seen and evaluated the patient, evaluated laboratory and imaging results, formulated the assessment and plan and placed orders. The Patient requires high complexity decision making for assessment and support.  Case was discussed on Rounds with the Respiratory Therapy Staff  Allyne Gee, MD [facility] Pulmonary Critical Care Medicine Sleep Medicine

## 2019-01-01 LAB — BASIC METABOLIC PANEL
Anion gap: 8 (ref 5–15)
BUN: 13 mg/dL (ref 8–23)
CO2: 28 mmol/L (ref 22–32)
Calcium: 9.2 mg/dL (ref 8.9–10.3)
Chloride: 103 mmol/L (ref 98–111)
Creatinine, Ser: 0.61 mg/dL (ref 0.61–1.24)
GFR calc Af Amer: >60 mL/min (ref >60)
GFR calc non Af Amer: >60 mL/min (ref >60)
Glucose, Bld: 97 mg/dL (ref 70–99)
Potassium: 3.7 mmol/L (ref 3.5–5.1)
Sodium: 139 mmol/L (ref 135–145)

## 2019-01-01 LAB — CBC
HCT: 32 % — ABNORMAL LOW (ref 39.0–52.0)
Hemoglobin: 9.6 g/dL — ABNORMAL LOW (ref 13.0–17.0)
MCH: 25.5 pg — ABNORMAL LOW (ref 26.0–34.0)
MCHC: 30 g/dL (ref 30.0–36.0)
MCV: 85.1 fL (ref 80.0–100.0)
Platelets: 387 10*3/uL (ref 150–400)
RBC: 3.76 MIL/uL — ABNORMAL LOW (ref 4.22–5.81)
RDW: 15.3 % (ref 11.5–15.5)
WBC: 7 10*3/uL (ref 4.0–10.5)
nRBC: 0 % (ref 0.0–0.2)

## 2019-01-01 LAB — MAGNESIUM: Magnesium: 1.9 mg/dL (ref 1.7–2.4)

## 2019-01-01 NOTE — Progress Notes (Addendum)
Pulmonary Critical Care Medicine [place]   PULMONARY CRITICAL CARE SERVICE  PROGRESS NOTE  Date of Service: 01/01/2019  Robert Pham  [id number]  DOB: [birth year]   DOA: 12/14/2018  Referring Physician: Merton Border, MD  HPI: Robert Pham is a 68 y.o. male seen for follow up of Acute on Chronic Respiratory Failure.  Patient continues on aerosol trach collar 40% FiO2 satting well with no fever or distress.  Medications: Reviewed on Rounds  Physical Exam:  Vitals: Pulse 58 respirations 20 BP 142/78 O2 sat 100% temp 96.2  Ventilator Settings ATC 40%  . General: Comfortable at this time . Eyes: Grossly normal lids, irises & conjunctiva . ENT: grossly tongue is normal . Neck: no obvious mass . Cardiovascular: S1 S2 normal no gallop . Respiratory: No rales or rhonchi noted . Abdomen: soft . Skin: no rash seen on limited exam . Musculoskeletal: not rigid . Psychiatric:unable to assess . Neurologic: no seizure no involuntary movements         Lab Data:   Basic Metabolic Panel: Recent Labs  Lab 12/27/18 0847 12/28/18 0714 01/01/19 1052  NA 141  --  139  K 3.4* 3.5 3.7  CL 101  --  103  CO2 25  --  28  GLUCOSE 101*  --  97  BUN 21  --  13  CREATININE 0.95  --  0.61  CALCIUM 9.6  --  9.2  MG 2.0  --  1.9  PHOS 3.7  --   --     ABG: Recent Labs  Lab 12/25/18 1735  PHART 7.380  PCO2ART 46.6  PO2ART 72.7*  HCO3 27.3  O2SAT 94.4    Liver Function Tests: No results for input(s): AST, ALT, ALKPHOS, BILITOT, PROT, ALBUMIN in the last 168 hours. No results for input(s): LIPASE, AMYLASE in the last 168 hours. No results for input(s): AMMONIA in the last 168 hours.  CBC: Recent Labs  Lab 12/27/18 0847 01/01/19 1052  WBC 5.8 7.0  HGB 10.6* 9.6*  HCT 35.6* 32.0*  MCV 87.0 85.1  PLT 351 387    Cardiac Enzymes: No results for input(s): CKTOTAL, CKMB, CKMBINDEX, TROPONINI in the last 168 hours.  BNP (last 3 results) Recent Labs     11/28/18 2346  BNP 75.2    ProBNP (last 3 results) No results for input(s): PROBNP in the last 8760 hours.  Radiological Exams: No results found.  Assessment/Plan Active Problems:   Acute on chronic respiratory failure with hypoxia (HCC)   Obstructive chronic bronchitis with exacerbation (HCC)   GERD (gastroesophageal reflux disease)   Generalized anxiety disorder   Ventilator dependent ([place])   1. Acute on chronic respiratory failure with hypoxia continue with trach collar 40% continue to wean as tolerated.  Continue aggressive pulmonary toilet supportive measures. 2. COPD at baseline we will continue with supportive care 3. GERD at baseline we will continue present management 4. Generalized anxiety disorder patient doing better 5. Vent dependent liberated from the ventilator    I have personally seen and evaluated the patient, evaluated laboratory and imaging results, formulated the assessment and plan and placed orders. The Patient requires high complexity decision making for assessment and support.  Case was discussed on Rounds with the Respiratory Therapy Staff  Allyne Gee, MD [facility] Pulmonary Critical Care Medicine Sleep Medicine

## 2019-01-02 NOTE — Progress Notes (Signed)
Pulmonary Critical Care Medicine [place]   PULMONARY CRITICAL CARE SERVICE  PROGRESS NOTE  Date of Service: 01/02/2019  Robert Pham  [id number]  DOB: 01/18/1951   DOA: 12/14/2018  Referring Physician: Merton Border, MD  HPI: Robert Pham is a 68 y.o. male seen for follow up of Acute on Chronic Respiratory Failure.  Patient currently is on T collar has been requiring 40% FiO2 saturations are good otherwise  Medications: Reviewed on Rounds  Physical Exam:  Vitals: Temperature 96.8 pulse 83 respiratory 18 blood pressure 150/64 saturations 100%  Ventilator Settings off the ventilator on T collar  . General: Comfortable at this time . Eyes: Grossly normal lids, irises & conjunctiva . ENT: grossly tongue is normal . Neck: no obvious mass . Cardiovascular: S1 S2 normal no gallop . Respiratory: No rhonchi no rales are noted . Abdomen: soft . Skin: no rash seen on limited exam . Musculoskeletal: not rigid . Psychiatric:unable to assess . Neurologic: no seizure no involuntary movements         Lab Data:   Basic Metabolic Panel: Recent Labs  Lab 12/27/18 0847 12/28/18 0714 01/01/19 1052  NA 141  --  139  K 3.4* 3.5 3.7  CL 101  --  103  CO2 25  --  28  GLUCOSE 101*  --  97  BUN 21  --  13  CREATININE 0.95  --  0.61  CALCIUM 9.6  --  9.2  MG 2.0  --  1.9  PHOS 3.7  --   --     ABG: No results for input(s): PHART, PCO2ART, PO2ART, HCO3, O2SAT in the last 168 hours.  Liver Function Tests: No results for input(s): AST, ALT, ALKPHOS, BILITOT, PROT, ALBUMIN in the last 168 hours. No results for input(s): LIPASE, AMYLASE in the last 168 hours. No results for input(s): AMMONIA in the last 168 hours.  CBC: Recent Labs  Lab 12/27/18 0847 01/01/19 1052  WBC 5.8 7.0  HGB 10.6* 9.6*  HCT 35.6* 32.0*  MCV 87.0 85.1  PLT 351 387    Cardiac Enzymes: No results for input(s): CKTOTAL, CKMB, CKMBINDEX, TROPONINI in the last 168 hours.  BNP  (last 3 results) Recent Labs    11/28/18 2346  BNP 75.2    ProBNP (last 3 results) No results for input(s): PROBNP in the last 8760 hours.  Radiological Exams: No results found.  Assessment/Plan Active Problems:   Acute on chronic respiratory failure with hypoxia (HCC)   Obstructive chronic bronchitis with exacerbation (HCC)   GERD (gastroesophageal reflux disease)   Generalized anxiety disorder   Ventilator dependent ([place])   1. Acute on chronic respiratory failure with hypoxia continue on T-piece secretion management pulmonary toilet. 2. Obstructive chronic bronchitis at baseline we will continue to follow 3. GERD patient is at baseline we will continue with supportive care 4. Generalized anxiety disorder at baseline we will continue present management 5. Vent dependence at baseline patient is now off the ventilator   I have personally seen and evaluated the patient, evaluated laboratory and imaging results, formulated the assessment and plan and placed orders. The Patient requires high complexity decision making for assessment and support.  Case was discussed on Rounds with the Respiratory Therapy Staff  Allyne Gee, MD [facility] Pulmonary Critical Care Medicine Sleep Medicine

## 2019-01-03 NOTE — Progress Notes (Addendum)
Pulmonary Critical Care Medicine [place]   PULMONARY CRITICAL CARE SERVICE  PROGRESS NOTE  Date of Service: 01/03/2019  Robert Pham  [id number]  DOB: 24-Dec-1950   DOA: 12/14/2018  Referring Physician: Merton Border, MD  HPI: Robert Pham is a 68 y.o. male seen for follow up of Acute on Chronic Respiratory Failure.  Patient is on aerosol trach collar 30% FiO2 using PMV without difficulty.  Satting well no fever or distress.  Medications: Reviewed on Rounds  Physical Exam:  Vitals: Pulse 89 respiration 18 BP 123/86 O2 sat 96% temp 97.0  Ventilator Settings ATC 30%  . General: Comfortable at this time . Eyes: Grossly normal lids, irises & conjunctiva . ENT: grossly tongue is normal . Neck: no obvious mass . Cardiovascular: S1 S2 normal no gallop . Respiratory: No rales or rhonchi noted . Abdomen: soft . Skin: no rash seen on limited exam . Musculoskeletal: not rigid . Psychiatric:unable to assess . Neurologic: no seizure no involuntary movements         Lab Data:   Basic Metabolic Panel: Recent Labs  Lab 12/28/18 0714 01/01/19 1052  NA  --  139  K 3.5 3.7  CL  --  103  CO2  --  28  GLUCOSE  --  97  BUN  --  13  CREATININE  --  0.61  CALCIUM  --  9.2  MG  --  1.9    ABG: No results for input(s): PHART, PCO2ART, PO2ART, HCO3, O2SAT in the last 168 hours.  Liver Function Tests: No results for input(s): AST, ALT, ALKPHOS, BILITOT, PROT, ALBUMIN in the last 168 hours. No results for input(s): LIPASE, AMYLASE in the last 168 hours. No results for input(s): AMMONIA in the last 168 hours.  CBC: Recent Labs  Lab 01/01/19 1052  WBC 7.0  HGB 9.6*  HCT 32.0*  MCV 85.1  PLT 387    Cardiac Enzymes: No results for input(s): CKTOTAL, CKMB, CKMBINDEX, TROPONINI in the last 168 hours.  BNP (last 3 results) Recent Labs    11/28/18 2346  BNP 75.2    ProBNP (last 3 results) No results for input(s): PROBNP in the last 8760  hours.  Radiological Exams: No results found.  Assessment/Plan Active Problems:   Acute on chronic respiratory failure with hypoxia (HCC)   Obstructive chronic bronchitis with exacerbation (HCC)   GERD (gastroesophageal reflux disease)   Generalized anxiety disorder   Ventilator dependent ([place])   1. Acute on chronic respiratory failure with hypoxia continue on T-piece secretion management pulmonary toilet. 2. Obstructive chronic bronchitis at baseline we will continue to follow 3. GERD patient is at baseline we will continue with supportive care 4. Generalized anxiety disorder at baseline we will continue present management 5. Vent dependence at baseline patient is now off the ventilator   I have personally seen and evaluated the patient, evaluated laboratory and imaging results, formulated the assessment and plan and placed orders. The Patient requires high complexity decision making for assessment and support.  Case was discussed on Rounds with the Respiratory Therapy Staff  Allyne Gee, MD [facility] Pulmonary Critical Care Medicine Sleep Medicine

## 2019-01-04 LAB — CULTURE, RESPIRATORY W GRAM STAIN: Culture: NORMAL

## 2019-01-04 NOTE — Progress Notes (Addendum)
Pulmonary Critical Care Medicine [place]   PULMONARY CRITICAL CARE SERVICE  PROGRESS NOTE  Date of Service: 01/04/2019  Robert Pham  [id number]  DOB: Jan 03, 1951   DOA: 12/14/2018  Referring Physician: Merton Border, MD  HPI: Robert Pham is a 68 y.o. male seen for follow up of Acute on Chronic Respiratory Failure.  Patient is on aerosol trach collar 40% FiO2 using PMV with no difficulty.  Medications: Reviewed on Rounds  Physical Exam:  Vitals: Pulse 79 respirations 20 BP 128/80 O2 sat 95% and a 7.0  Ventilator Settings ATC 40%  . General: Comfortable at this time . Eyes: Grossly normal lids, irises & conjunctiva . ENT: grossly tongue is normal . Neck: no obvious mass . Cardiovascular: S1 S2 normal no gallop . Respiratory: No rales or rhonchi noted . Abdomen: soft . Skin: no rash seen on limited exam . Musculoskeletal: not rigid . Psychiatric:unable to assess . Neurologic: no seizure no involuntary movements         Lab Data:   Basic Metabolic Panel: Recent Labs  Lab 01/01/19 1052  NA 139  K 3.7  CL 103  CO2 28  GLUCOSE 97  BUN 13  CREATININE 0.61  CALCIUM 9.2  MG 1.9    ABG: No results for input(s): PHART, PCO2ART, PO2ART, HCO3, O2SAT in the last 168 hours.  Liver Function Tests: No results for input(s): AST, ALT, ALKPHOS, BILITOT, PROT, ALBUMIN in the last 168 hours. No results for input(s): LIPASE, AMYLASE in the last 168 hours. No results for input(s): AMMONIA in the last 168 hours.  CBC: Recent Labs  Lab 01/01/19 1052  WBC 7.0  HGB 9.6*  HCT 32.0*  MCV 85.1  PLT 387    Cardiac Enzymes: No results for input(s): CKTOTAL, CKMB, CKMBINDEX, TROPONINI in the last 168 hours.  BNP (last 3 results) Recent Labs    11/28/18 2346  BNP 75.2    ProBNP (last 3 results) No results for input(s): PROBNP in the last 8760 hours.  Radiological Exams: No results found.  Assessment/Plan Active Problems:   Acute on  chronic respiratory failure with hypoxia (HCC)   Obstructive chronic bronchitis with exacerbation (HCC)   GERD (gastroesophageal reflux disease)   Generalized anxiety disorder   Ventilator dependent ([place])   1. Acute on chronic respiratory failure with hypoxia continue weaning on T collar currently 40% FiO2.  Continue supportive measures and pulmonary toilet. 2. Obstructive chronic bronchitis at baseline we will continue to follow 3. GERD patient is at baseline we will continue with supportive care 4. Generalized anxiety disorder at baseline we will continue present management 5. Vent dependence at baseline patient is now off the ventilator   I have personally seen and evaluated the patient, evaluated laboratory and imaging results, formulated the assessment and plan and placed orders. The Patient requires high complexity decision making for assessment and support.  Case was discussed on Rounds with the Respiratory Therapy Staff  Allyne Gee, MD [facility] Pulmonary Critical Care Medicine Sleep Medicine

## 2019-01-05 LAB — MAGNESIUM: Magnesium: 2 mg/dL (ref 1.7–2.4)

## 2019-01-05 LAB — BASIC METABOLIC PANEL
Anion gap: 8 (ref 5–15)
BUN: 14 mg/dL (ref 8–23)
CO2: 30 mmol/L (ref 22–32)
Calcium: 9.7 mg/dL (ref 8.9–10.3)
Chloride: 102 mmol/L (ref 98–111)
Creatinine, Ser: 0.83 mg/dL (ref 0.61–1.24)
GFR calc Af Amer: >60 mL/min (ref >60)
GFR calc non Af Amer: >60 mL/min (ref >60)
Glucose, Bld: 92 mg/dL (ref 70–99)
Potassium: 4.9 mmol/L (ref 3.5–5.1)
Sodium: 140 mmol/L (ref 135–145)

## 2019-01-05 LAB — CBC
HCT: 34.1 % — ABNORMAL LOW (ref 39.0–52.0)
Hemoglobin: 10.1 g/dL — ABNORMAL LOW (ref 13.0–17.0)
MCH: 25 pg — ABNORMAL LOW (ref 26.0–34.0)
MCHC: 29.6 g/dL — ABNORMAL LOW (ref 30.0–36.0)
MCV: 84.4 fL (ref 80.0–100.0)
Platelets: 443 10*3/uL — ABNORMAL HIGH (ref 150–400)
RBC: 4.04 MIL/uL — ABNORMAL LOW (ref 4.22–5.81)
RDW: 15.8 % — ABNORMAL HIGH (ref 11.5–15.5)
WBC: 6.4 10*3/uL (ref 4.0–10.5)
nRBC: 0 % (ref 0.0–0.2)

## 2019-01-05 NOTE — Progress Notes (Addendum)
Pulmonary Critical Care Medicine [place]   PULMONARY CRITICAL CARE SERVICE  PROGRESS NOTE  Date of Service: 01/05/2019  Emeka Lindner  [id number]  DOB: [birth year]   DOA: 12/14/2018  Referring Physician: Merton Border, MD  HPI: Robert Pham is a 68 y.o. male seen for follow up of Acute on Chronic Respiratory Failure.  Patient continues on aerosol trach collar 20% FiO2 satting well no distress.  Medications: Reviewed on Rounds  Physical Exam:  Vitals: Pulse 91 respirations 22 BP 130/91 O2 sat 94% temp 97.5  Ventilator Settings AC 40%  . General: Comfortable at this time . Eyes: Grossly normal lids, irises & conjunctiva . ENT: grossly tongue is normal . Neck: no obvious mass . Cardiovascular: S1 S2 normal no gallop . Respiratory: No rales or rhonchi noted . Abdomen: soft . Skin: no rash seen on limited exam . Musculoskeletal: not rigid . Psychiatric:unable to assess . Neurologic: no seizure no involuntary movements         Lab Data:   Basic Metabolic Panel: Recent Labs  Lab 01/01/19 1052 01/05/19 0627  NA 139 140  K 3.7 4.9  CL 103 102  CO2 28 30  GLUCOSE 97 92  BUN 13 14  CREATININE 0.61 0.83  CALCIUM 9.2 9.7  MG 1.9 2.0    ABG: No results for input(s): PHART, PCO2ART, PO2ART, HCO3, O2SAT in the last 168 hours.  Liver Function Tests: No results for input(s): AST, ALT, ALKPHOS, BILITOT, PROT, ALBUMIN in the last 168 hours. No results for input(s): LIPASE, AMYLASE in the last 168 hours. No results for input(s): AMMONIA in the last 168 hours.  CBC: Recent Labs  Lab 01/01/19 1052 01/05/19 0627  WBC 7.0 6.4  HGB 9.6* 10.1*  HCT 32.0* 34.1*  MCV 85.1 84.4  PLT 387 443*    Cardiac Enzymes: No results for input(s): CKTOTAL, CKMB, CKMBINDEX, TROPONINI in the last 168 hours.  BNP (last 3 results) Recent Labs    11/28/18 2346  BNP 75.2    ProBNP (last 3 results) No results for input(s): PROBNP in the last 8760  hours.  Radiological Exams: No results found.  Assessment/Plan Active Problems:   Acute on chronic respiratory failure with hypoxia (HCC)   Obstructive chronic bronchitis with exacerbation (HCC)   GERD (gastroesophageal reflux disease)   Generalized anxiety disorder   Ventilator dependent ([place])   1. Acute on chronic respiratory failure with hypoxia continue weaning on T collar currently 40% FiO2.  Continue supportive measures and pulmonary toilet. 2. Obstructive chronic bronchitis at baseline we will continue to follow 3. GERD patient is at baseline we will continue with supportive care 4. Generalized anxiety disorder at baseline we will continue present management 5. Vent dependence at baseline patient is now off the ventilator   I have personally seen and evaluated the patient, evaluated laboratory and imaging results, formulated the assessment and plan and placed orders. The Patient requires high complexity decision making for assessment and support.  Case was discussed on Rounds with the Respiratory Therapy Staff  Allyne Gee, MD [facility] Pulmonary Critical Care Medicine Sleep Medicine

## 2019-01-07 NOTE — Progress Notes (Addendum)
Pulmonary Critical Care Medicine [place]   PULMONARY CRITICAL CARE SERVICE  PROGRESS NOTE  Date of Service: 01/07/2019  Robert Pham  [id number]  DOB: [birth year]   DOA: 12/14/2018  Referring Physician: Merton Border, MD  HPI: Robert Pham is a 68 y.o. male seen for follow up of Acute on Chronic Respiratory Failure.  Patient is on aerosol trach collar 40% O2 satting well with no distress.  Medications: Reviewed on Rounds  Physical Exam:  Vitals: Pulse 63 respirations 20 BP 132/76 O2 sat 100% temp 97.4  Ventilator Settings ATC 40%  . General: Comfortable at this time . Eyes: Grossly normal lids, irises & conjunctiva . ENT: grossly tongue is normal . Neck: no obvious mass . Cardiovascular: S1 S2 normal no gallop . Respiratory: No rales or rhonchi noted . Abdomen: soft . Skin: no rash seen on limited exam . Musculoskeletal: not rigid . Psychiatric:unable to assess . Neurologic: no seizure no involuntary movements         Lab Data:   Basic Metabolic Panel: Recent Labs  Lab 01/01/19 1052 01/05/19 0627  NA 139 140  K 3.7 4.9  CL 103 102  CO2 28 30  GLUCOSE 97 92  BUN 13 14  CREATININE 0.61 0.83  CALCIUM 9.2 9.7  MG 1.9 2.0    ABG: No results for input(s): PHART, PCO2ART, PO2ART, HCO3, O2SAT in the last 168 hours.  Liver Function Tests: No results for input(s): AST, ALT, ALKPHOS, BILITOT, PROT, ALBUMIN in the last 168 hours. No results for input(s): LIPASE, AMYLASE in the last 168 hours. No results for input(s): AMMONIA in the last 168 hours.  CBC: Recent Labs  Lab 01/01/19 1052 01/05/19 0627  WBC 7.0 6.4  HGB 9.6* 10.1*  HCT 32.0* 34.1*  MCV 85.1 84.4  PLT 387 443*    Cardiac Enzymes: No results for input(s): CKTOTAL, CKMB, CKMBINDEX, TROPONINI in the last 168 hours.  BNP (last 3 results) Recent Labs    11/28/18 2346  BNP 75.2    ProBNP (last 3 results) No results for input(s): PROBNP in the last 8760  hours.  Radiological Exams: No results found.  Assessment/Plan Active Problems:   Acute on chronic respiratory failure with hypoxia (HCC)   Obstructive chronic bronchitis with exacerbation (HCC)   GERD (gastroesophageal reflux disease)   Generalized anxiety disorder   Ventilator dependent ([place])   1. Acute on chronic respiratory failure with hypoxiacontinue weaning on T collar currently 40% FiO2. Continue supportive measures and pulmonary toilet. 2. Obstructive chronic bronchitis at baseline we will continue to follow 3. GERD patient is at baseline we will continue with supportive care 4. Generalized anxiety disorder at baseline we will continue present management 5. Vent dependence at baseline patient is now off the ventilator   I have personally seen and evaluated the patient, evaluated laboratory and imaging results, formulated the assessment and plan and placed orders. The Patient requires high complexity decision making for assessment and support.  Case was discussed on Rounds with the Respiratory Therapy Staff  Allyne Gee, MD [facility] Pulmonary Critical Care Medicine Sleep Medicine

## 2019-01-08 ENCOUNTER — Other Ambulatory Visit (HOSPITAL_COMMUNITY): Payer: Self-pay

## 2019-01-08 NOTE — Progress Notes (Addendum)
Pulmonary Critical Care Medicine [place]   PULMONARY CRITICAL CARE SERVICE  PROGRESS NOTE  Date of Service: 01/08/2019  Byron Peacock  WPY:[id number]  DOB: 31-Dec-1950   DOA: 12/14/2018  Referring Physician: Merton Border, MD  HPI: Agamjot Kilgallon is a 68 y.o. male seen for follow up of Acute on Chronic Respiratory Failure.  Patient continues on aerosol trach collar for percent FiO2 satting well with no distress or fever.  Medications: Reviewed on Rounds  Physical Exam:  Vitals: Pulse 107 respirations 22 BP 156/60 O2 sat 90% temp 97.4  Ventilator Settings aerosol trach collar 40%  . General: Comfortable at this time . Eyes: Grossly normal lids, irises & conjunctiva . ENT: grossly tongue is normal . Neck: no obvious mass . Cardiovascular: S1 S2 normal no gallop . Respiratory: No rales or rhonchi noted . Abdomen: soft . Skin: no rash seen on limited exam . Musculoskeletal: not rigid . Psychiatric:unable to assess . Neurologic: no seizure no involuntary movements         Lab Data:   Basic Metabolic Panel: Recent Labs  Lab 01/05/19 0627  NA 140  K 4.9  CL 102  CO2 30  GLUCOSE 92  BUN 14  CREATININE 0.83  CALCIUM 9.7  MG 2.0    ABG: No results for input(s): PHART, PCO2ART, PO2ART, HCO3, O2SAT in the last 168 hours.  Liver Function Tests: No results for input(s): AST, ALT, ALKPHOS, BILITOT, PROT, ALBUMIN in the last 168 hours. No results for input(s): LIPASE, AMYLASE in the last 168 hours. No results for input(s): AMMONIA in the last 168 hours.  CBC: Recent Labs  Lab 01/05/19 0627  WBC 6.4  HGB 10.1*  HCT 34.1*  MCV 84.4  PLT 443*    Cardiac Enzymes: No results for input(s): CKTOTAL, CKMB, CKMBINDEX, TROPONINI in the last 168 hours.  BNP (last 3 results) Recent Labs    11/28/18 2346  BNP 75.2    ProBNP (last 3 results) No results for input(s): PROBNP in the last 8760 hours.  Radiological Exams: Dg Chest Port 1  View  Result Date: 01/08/2019 CLINICAL DATA:  Patient with acute on chronic respiratory failure EXAM: PORTABLE CHEST 1 VIEW COMPARISON:  Chest radiograph 12/22/2018 FINDINGS: Monitoring leads overlie the patient. Tracheostomy tube mid trachea. Stable cardiac and mediastinal contours. Pulmonary hyperinflation. Emphysematous changes. Bibasilar scarring. No pleural effusion or pneumothorax. Chronic blunting of the costophrenic angles bilaterally. IMPRESSION: Marked emphysematous change. Bibasilar scarring/atelectasis. Electronically Signed   By: Lovey Newcomer M.D.   On: 01/08/2019 15:07    Assessment/Plan Active Problems:   Acute on chronic respiratory failure with hypoxia (HCC)   Obstructive chronic bronchitis with exacerbation (HCC)   GERD (gastroesophageal reflux disease)   Generalized anxiety disorder   Ventilator dependent ([place])   1. Acute on chronic respiratory failure with hypoxiacontinue weaning on T collar currently 40% FiO2. Continue supportive measures and pulmonary toilet. 2. Obstructive chronic bronchitis at baseline we will continue to follow 3. GERD patient is at baseline we will continue with supportive care 4. Generalized anxiety disorder at baseline we will continue present management 5. Vent dependence at baseline patient is now off the ventilator   I have personally seen and evaluated the patient, evaluated laboratory and imaging results, formulated the assessment and plan and placed orders. The Patient requires high complexity decision making for assessment and support.  Case was discussed on Rounds with the Respiratory Therapy Staff  Allyne Gee, MD [facility] Pulmonary Critical Care Medicine Sleep Medicine

## 2019-01-09 LAB — BASIC METABOLIC PANEL
Anion gap: 10 (ref 5–15)
BUN: 12 mg/dL (ref 8–23)
CO2: 27 mmol/L (ref 22–32)
Calcium: 9.5 mg/dL (ref 8.9–10.3)
Chloride: 100 mmol/L (ref 98–111)
Creatinine, Ser: 0.75 mg/dL (ref 0.61–1.24)
GFR calc Af Amer: >60 mL/min (ref >60)
GFR calc non Af Amer: >60 mL/min (ref >60)
Glucose, Bld: 129 mg/dL — ABNORMAL HIGH (ref 70–99)
Potassium: 3.8 mmol/L (ref 3.5–5.1)
Sodium: 137 mmol/L (ref 135–145)

## 2019-01-09 LAB — CBC
HCT: 35.4 % — ABNORMAL LOW (ref 39.0–52.0)
Hemoglobin: 10.6 g/dL — ABNORMAL LOW (ref 13.0–17.0)
MCH: 24.9 pg — ABNORMAL LOW (ref 26.0–34.0)
MCHC: 29.9 g/dL — ABNORMAL LOW (ref 30.0–36.0)
MCV: 83.1 fL (ref 80.0–100.0)
Platelets: 432 10*3/uL — ABNORMAL HIGH (ref 150–400)
RBC: 4.26 MIL/uL (ref 4.22–5.81)
RDW: 15.7 % — ABNORMAL HIGH (ref 11.5–15.5)
WBC: 7 10*3/uL (ref 4.0–10.5)
nRBC: 0 % (ref 0.0–0.2)

## 2019-01-09 LAB — MAGNESIUM: Magnesium: 2 mg/dL (ref 1.7–2.4)

## 2019-01-09 NOTE — Progress Notes (Signed)
Pulmonary Critical Care Medicine [place]   PULMONARY CRITICAL CARE SERVICE  PROGRESS NOTE  Date of Service: 01/09/2019  Robert Pham  WNU:[id number]  DOB: 08-31-1950   DOA: 12/14/2018  Referring Physician: Merton Border, MD  HPI: Robert Pham is a 68 y.o. male seen for follow up of Acute on Chronic Respiratory Failure.  Patient right now is on T collar on 40% FiO2 good saturations are noted  Medications: Reviewed on Rounds  Physical Exam:  Vitals: Temperature 97.6 pulse 69 respiratory rate 22 blood pressure 152/64 saturations 96%  Ventilator Settings on T collar right now 40% FiO2  . General: Comfortable at this time . Eyes: Grossly normal lids, irises & conjunctiva . ENT: grossly tongue is normal . Neck: no obvious mass . Cardiovascular: S1 S2 normal no gallop . Respiratory: No rhonchi no rales are noted . Abdomen: soft . Skin: no rash seen on limited exam . Musculoskeletal: not rigid . Psychiatric:unable to assess . Neurologic: no seizure no involuntary movements         Lab Data:   Basic Metabolic Panel: Recent Labs  Lab 01/05/19 0627  NA 140  K 4.9  CL 102  CO2 30  GLUCOSE 92  BUN 14  CREATININE 0.83  CALCIUM 9.7  MG 2.0    ABG: No results for input(s): PHART, PCO2ART, PO2ART, HCO3, O2SAT in the last 168 hours.  Liver Function Tests: No results for input(s): AST, ALT, ALKPHOS, BILITOT, PROT, ALBUMIN in the last 168 hours. No results for input(s): LIPASE, AMYLASE in the last 168 hours. No results for input(s): AMMONIA in the last 168 hours.  CBC: Recent Labs  Lab 01/05/19 0627  WBC 6.4  HGB 10.1*  HCT 34.1*  MCV 84.4  PLT 443*    Cardiac Enzymes: No results for input(s): CKTOTAL, CKMB, CKMBINDEX, TROPONINI in the last 168 hours.  BNP (last 3 results) Recent Labs    11/28/18 2346  BNP 75.2    ProBNP (last 3 results) No results for input(s): PROBNP in the last 8760 hours.  Radiological Exams: Dg Chest Port 1  View  Result Date: 01/08/2019 CLINICAL DATA:  Patient with acute on chronic respiratory failure EXAM: PORTABLE CHEST 1 VIEW COMPARISON:  Chest radiograph 12/22/2018 FINDINGS: Monitoring leads overlie the patient. Tracheostomy tube mid trachea. Stable cardiac and mediastinal contours. Pulmonary hyperinflation. Emphysematous changes. Bibasilar scarring. No pleural effusion or pneumothorax. Chronic blunting of the costophrenic angles bilaterally. IMPRESSION: Marked emphysematous change. Bibasilar scarring/atelectasis. Electronically Signed   By: Lovey Newcomer M.D.   On: 01/08/2019 15:07    Assessment/Plan Active Problems:   Acute on chronic respiratory failure with hypoxia (HCC)   Obstructive chronic bronchitis with exacerbation (HCC)   GERD (gastroesophageal reflux disease)   Generalized anxiety disorder   Ventilator dependent ([place])   1. Acute on chronic respiratory failure with hypoxia we will continue with T collar trials on 40% FiO2 continue pulmonary toilet and follow along 2. 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 GERD at baseline 4. Anxiety baseline controlled 5. Ventilator dependent at baseline   I have personally seen and evaluated the patient, evaluated laboratory and imaging results, formulated the assessment and plan and placed orders. The Patient requires high complexity decision making for assessment and support.  Case was discussed on Rounds with the Respiratory Therapy Staff  Allyne Gee, MD [facility] Pulmonary Critical Care Medicine Sleep Medicine

## 2020-11-13 IMAGING — DX PORTABLE CHEST - 1 VIEW
1 series · 2 of 2 positions shown · non-contrast
Comparison: None.

CLINICAL DATA: Respiratory distress

EXAM:
PORTABLE CHEST 1 VIEW

[Series 1: chest · 0.14mm/px · 2 of 2 slices shown]
[im 1/2]
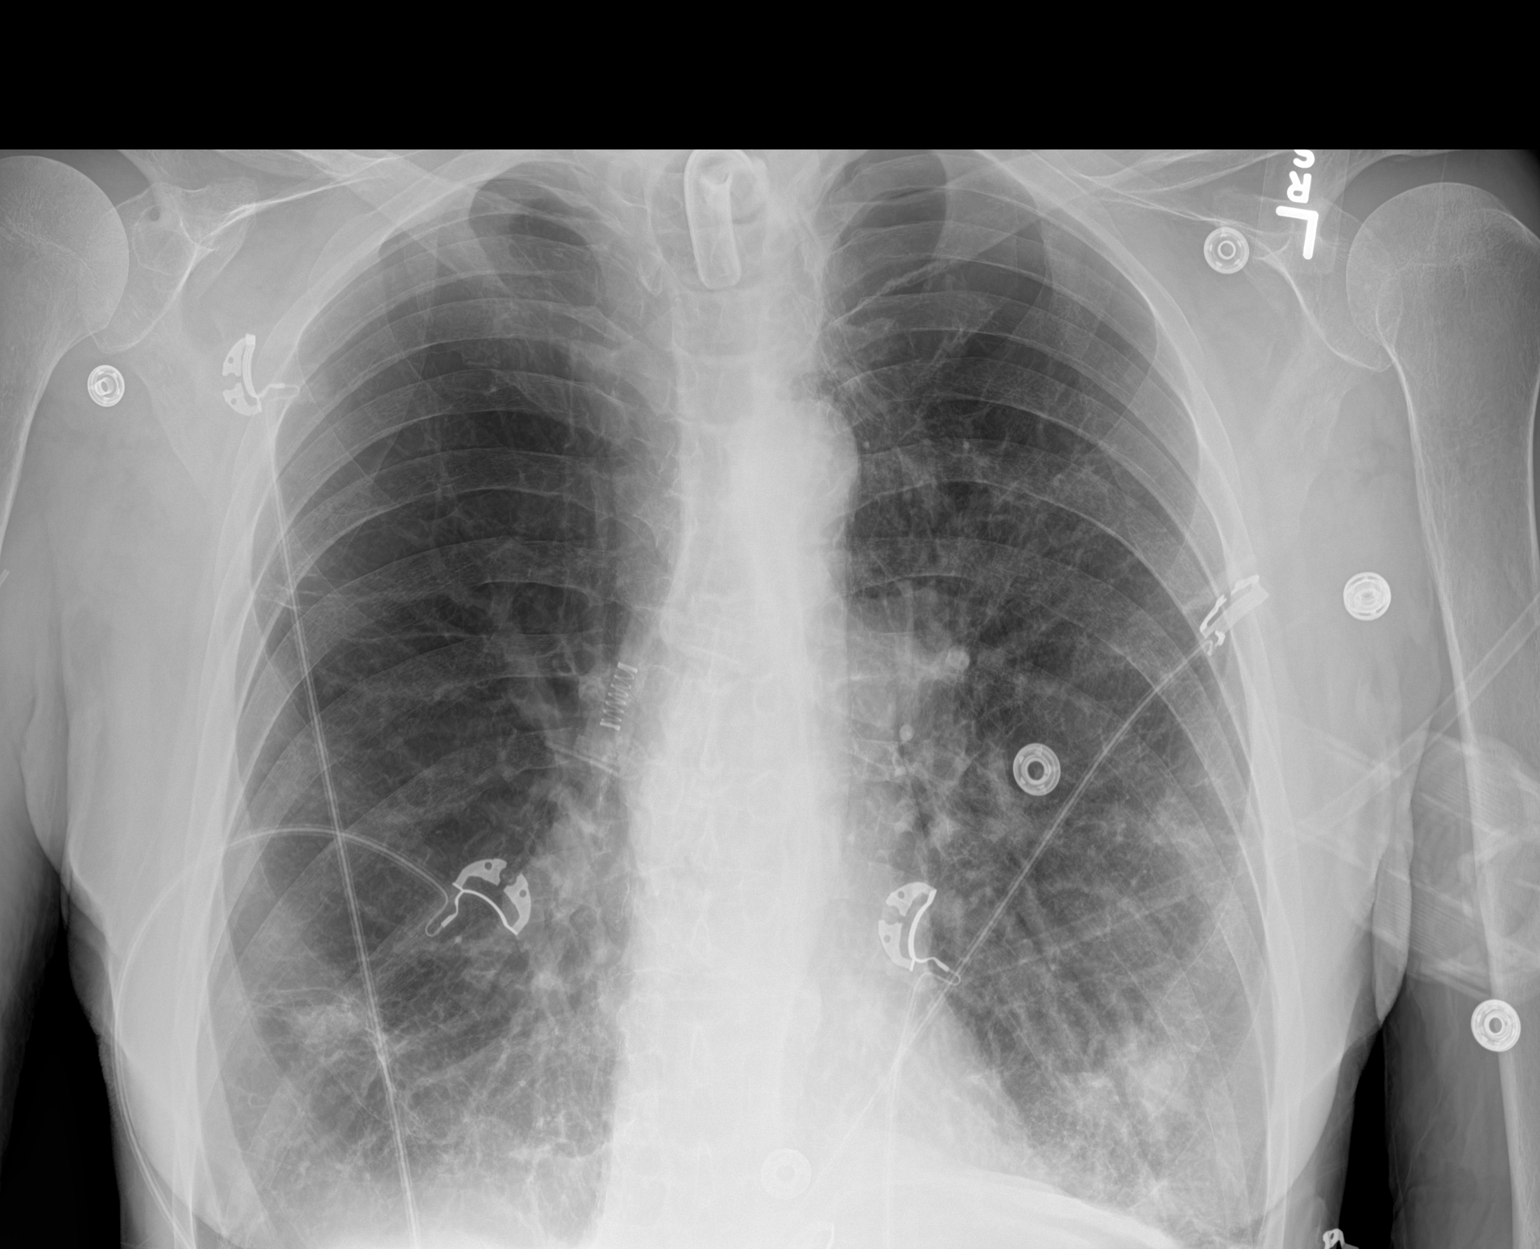
[im 2/2]
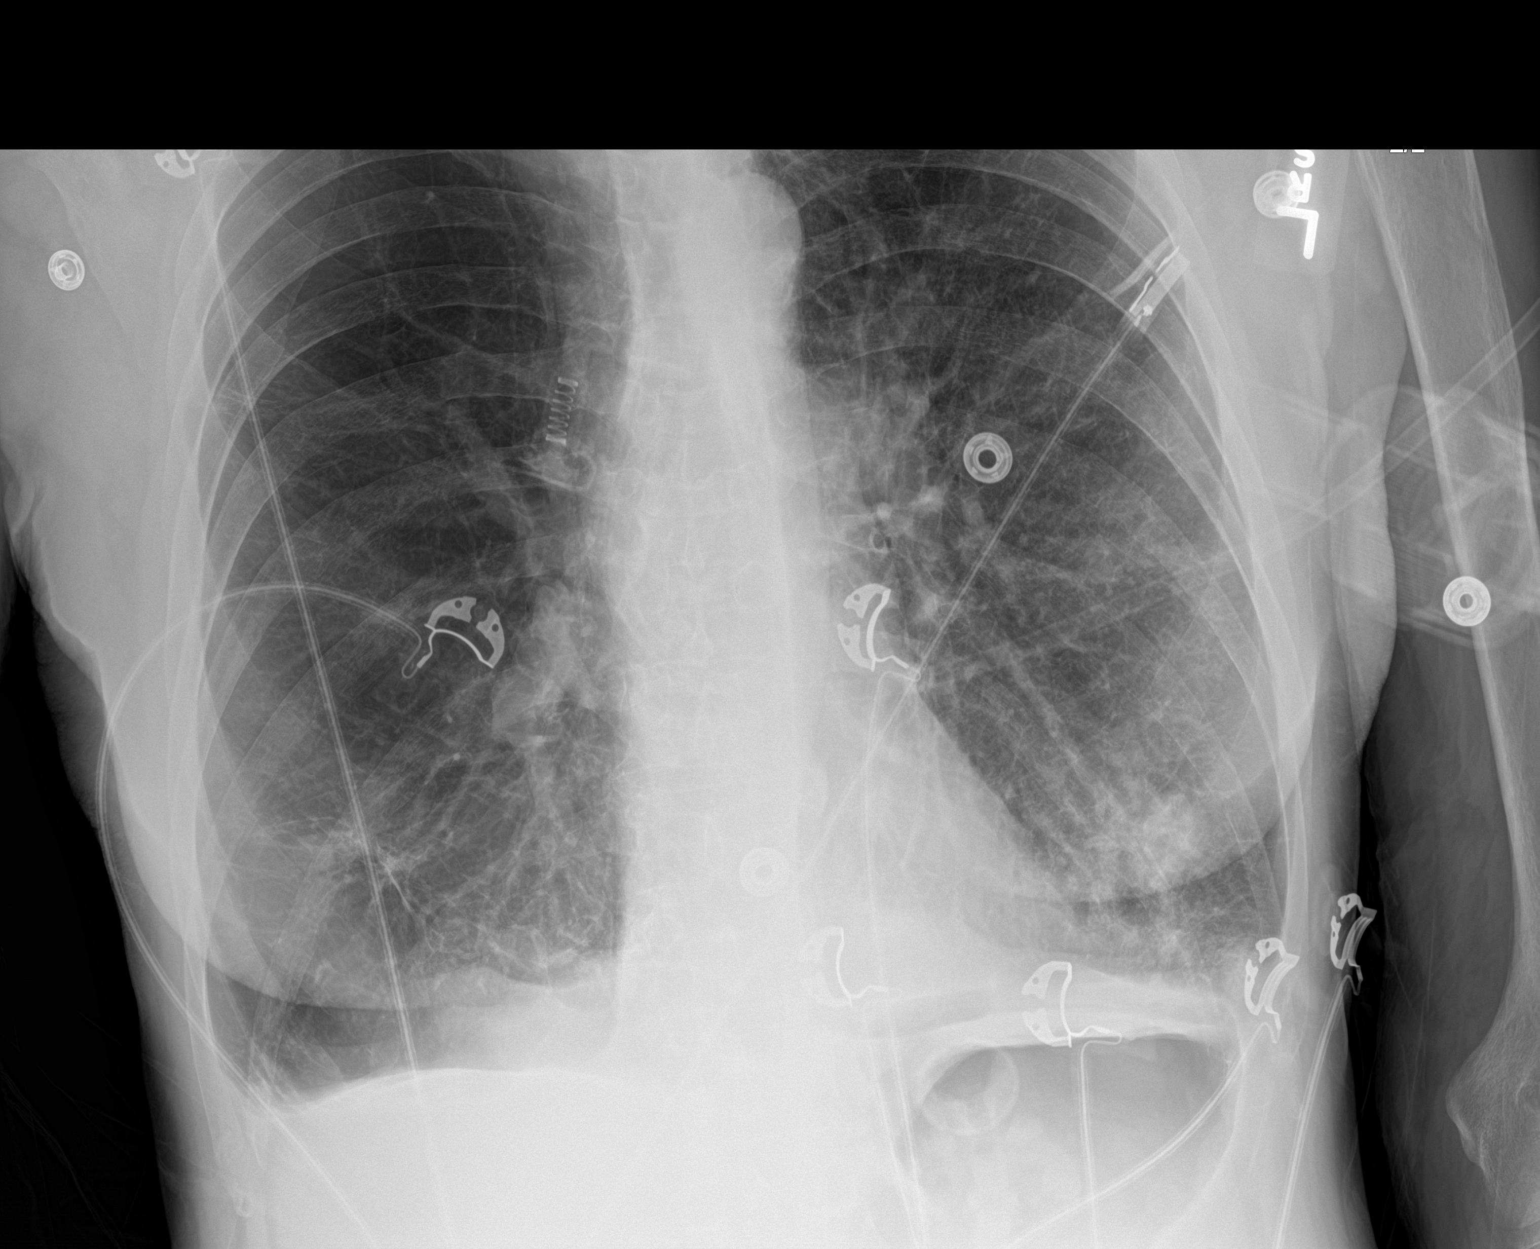

[2 of 2 positions shown; findings below may reference images not displayed]

FINDINGS: Cardiac shadows within normal limits. Tracheostomy tube is noted in
satisfactory position. Patchy infiltrates are noted in the bases
bilaterally. No sizable effusion is seen. Hyperinflation consistent
with COPD is noted. No bony abnormality is seen.
IMPRESSION: Mild patchy bibasilar infiltrates.

COPD.

## 2020-11-15 IMAGING — DX PORTABLE CHEST - 1 VIEW
1 series · 1 of 1 positions shown · non-contrast
Comparison: 11/29/2018

CLINICAL DATA: Respiratory failure, tracheostomy

EXAM:
PORTABLE CHEST 1 VIEW

[chest ap]
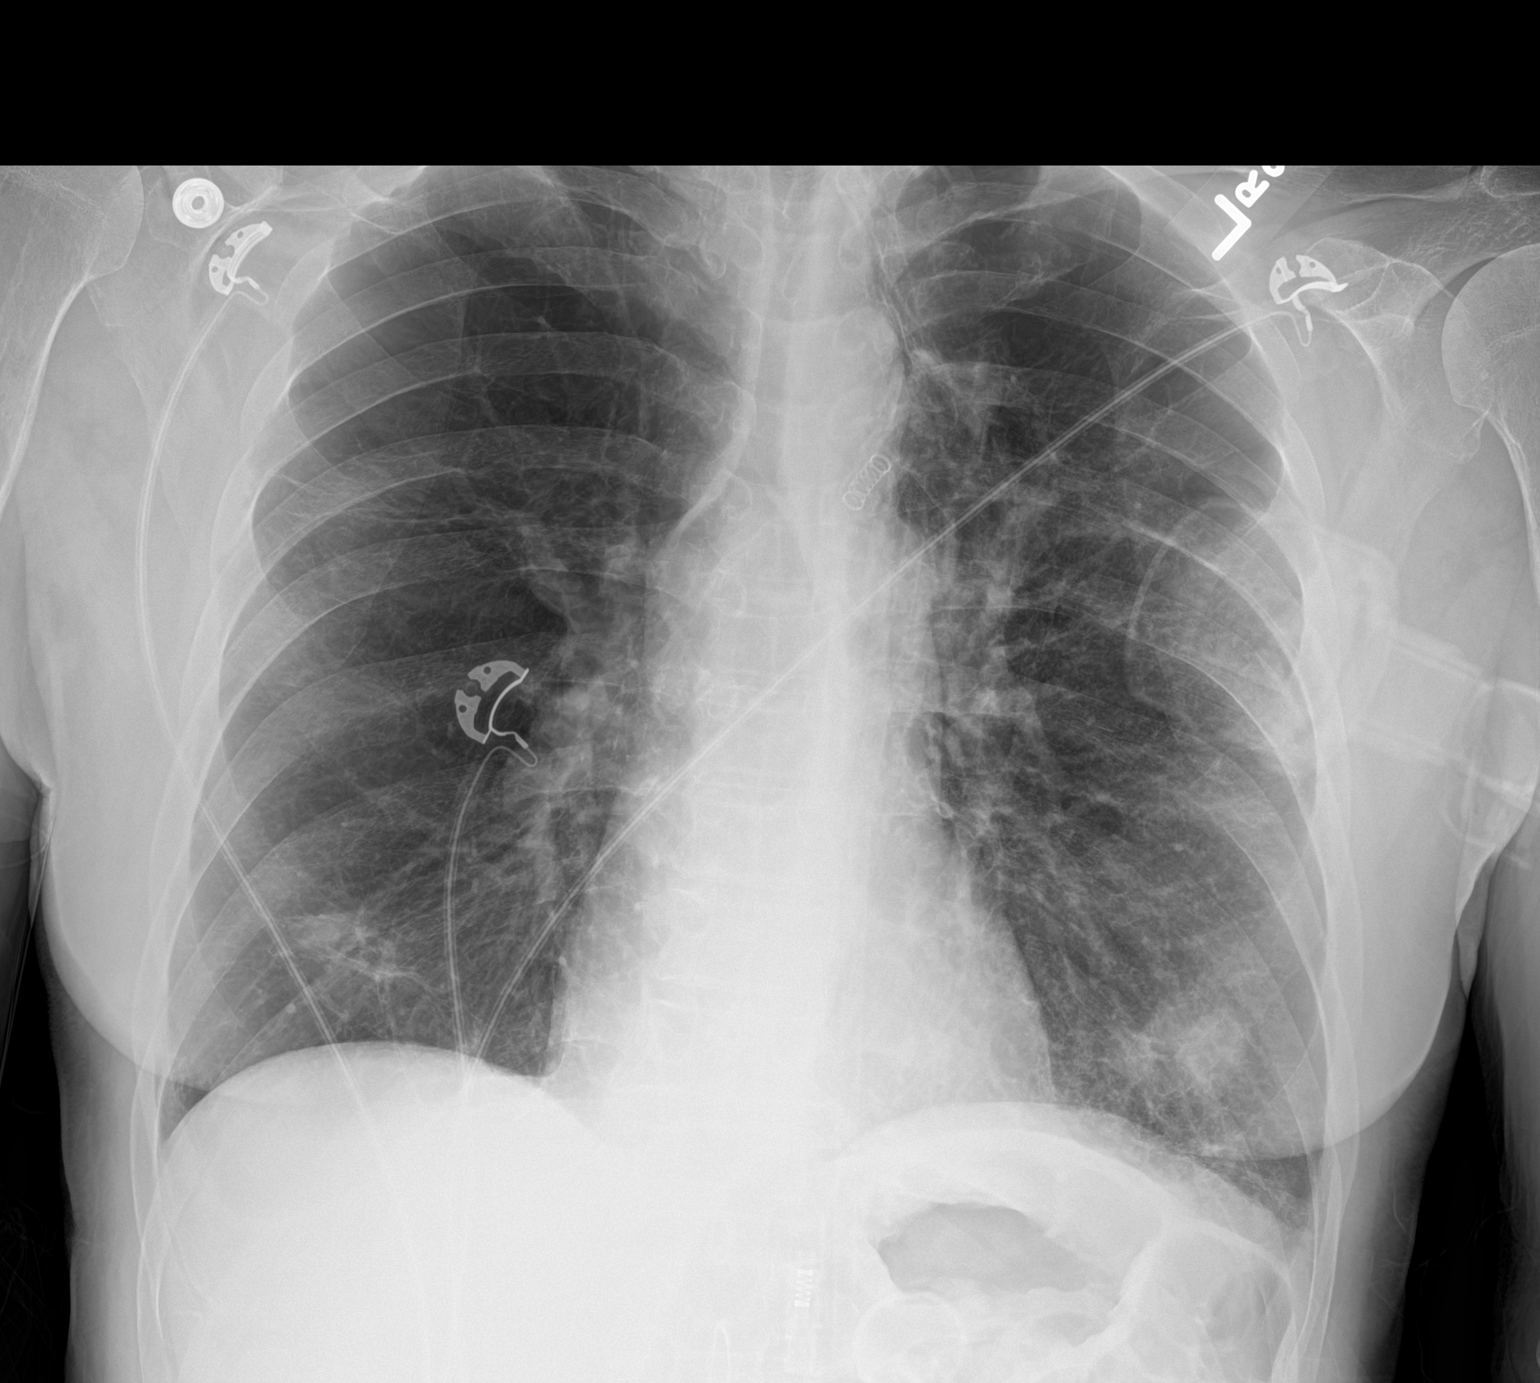

[1 of 1 positions shown; findings below may reference images not displayed]

FINDINGS: Hyperinflation of the lungs. Tracheostomy is unchanged. Patchy
opacities in the lung bases have improved. Heart is normal size. No
effusions or acute bony abnormality.
IMPRESSION: Hyperinflation, stable.  Improving patchy bibasilar opacities.

## 2020-11-17 IMAGING — DX PORTABLE CHEST - 1 VIEW
1 series · 2 of 2 positions shown · non-contrast
Comparison: Radiograph November 30, 2018.

CLINICAL DATA: Shortness of breath.

EXAM:
PORTABLE CHEST 1 VIEW

[Series 1: chest · 0.14mm/px · 2 of 2 slices shown]
[im 1/2]
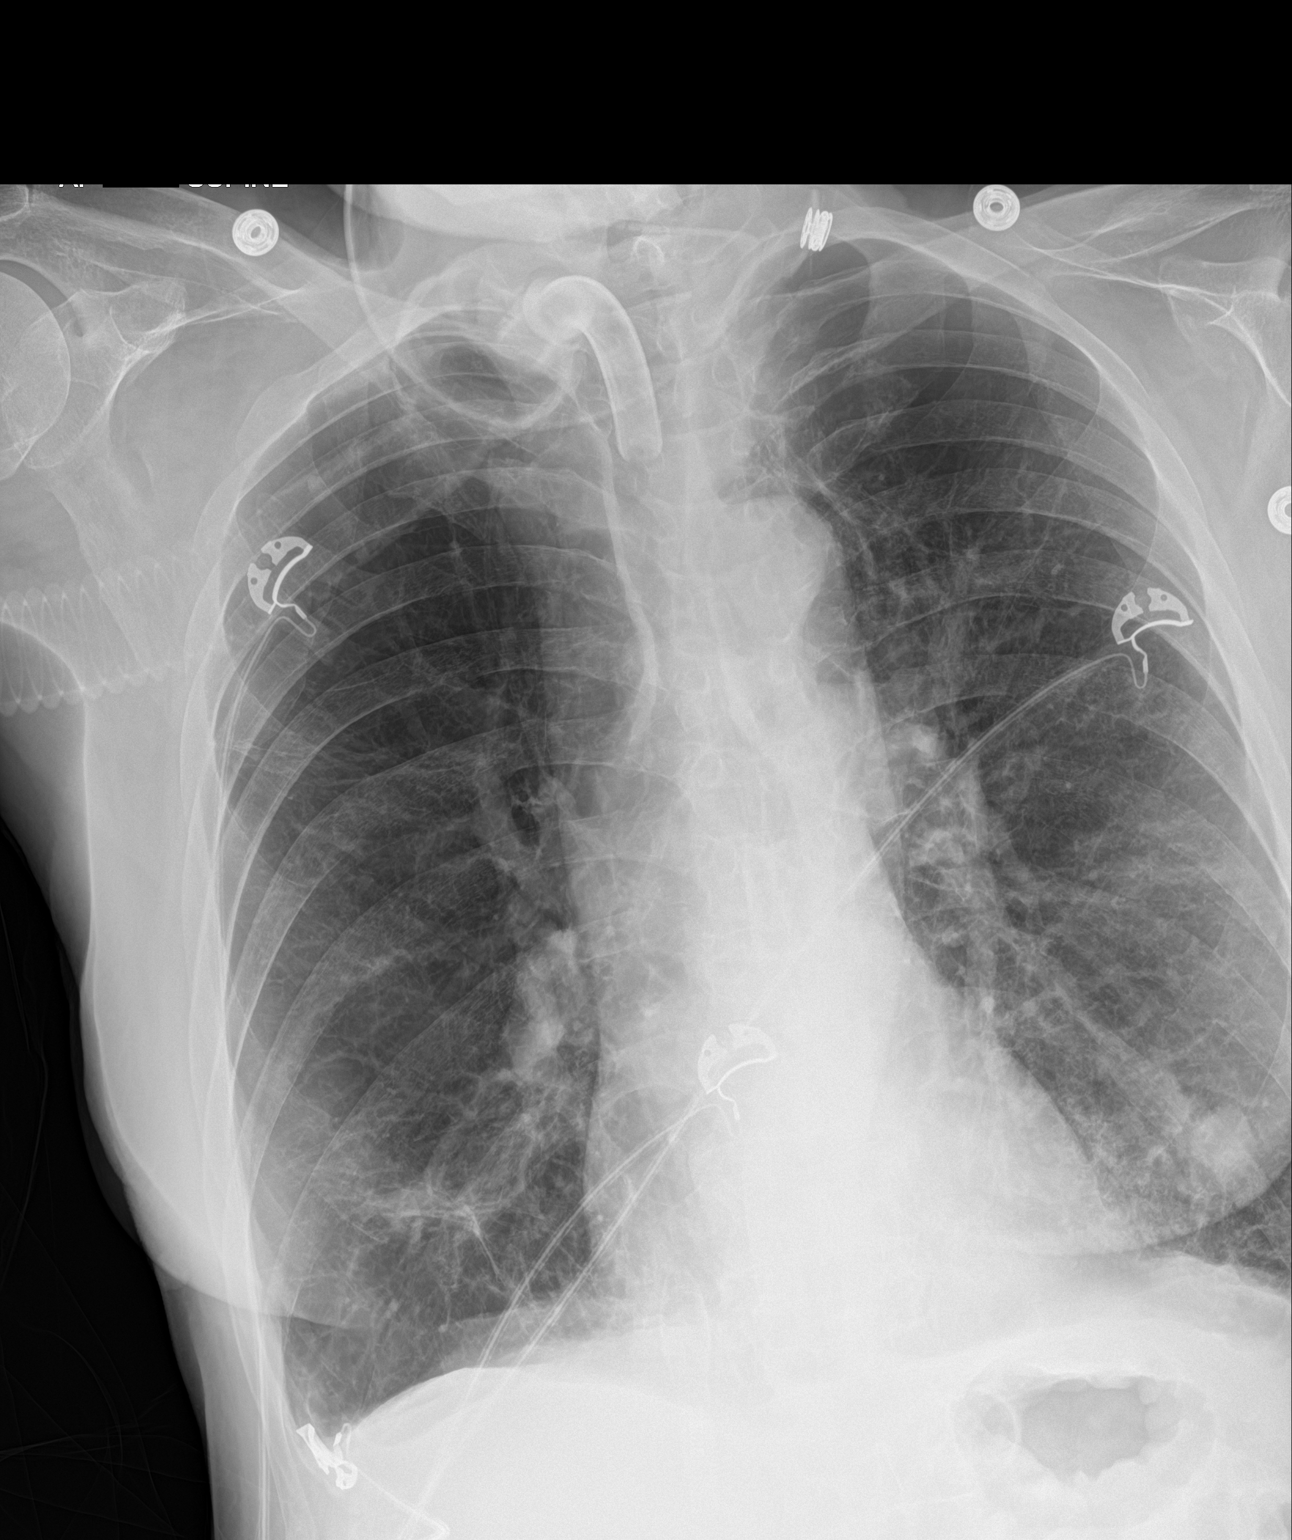
[im 2/2]
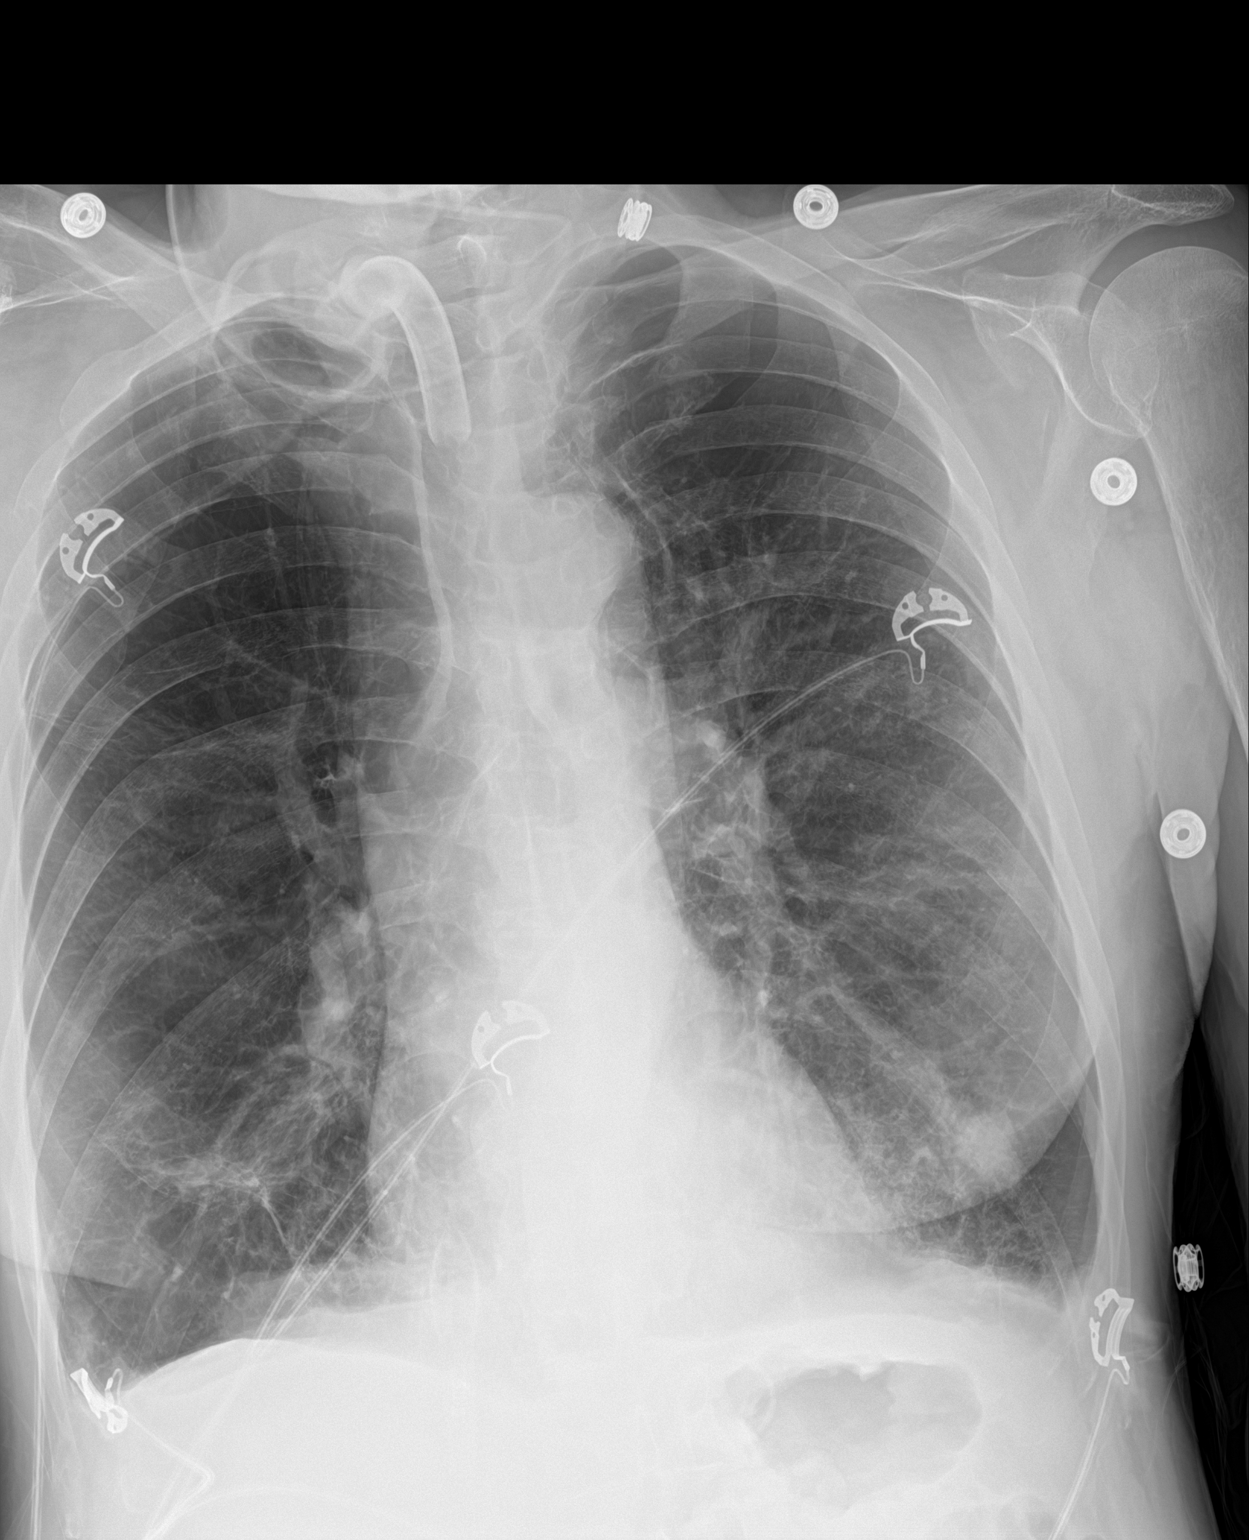

[2 of 2 positions shown; findings below may reference images not displayed]

FINDINGS: The heart size and mediastinal contours are within normal limits.
Tracheostomy tube is unchanged in position. Extensive emphysematous
disease is noted in both lungs. Stable bibasilar opacities are noted
concerning for scarring, atelectasis or possibly infiltrates. Stable
rounded density is noted in left lung base which may represent focal
inflammation. The visualized skeletal structures are unremarkable.
IMPRESSION: Stable bibasilar opacities are noted concerning for scarring,
atelectasis or possibly infiltrates. Stable rounded density is noted
in left lung base which may represent focal infiltrate, but if it is
unchanged in follow-up radiographs, CT scan of the chest is
recommended to rule out possible nodule or mass.

Emphysema ([id number]).

## 2020-11-29 IMAGING — DX PORTABLE CHEST - 1 VIEW
1 series · 1 of 1 positions shown · non-contrast
Comparison: December 11, 2018

CLINICAL DATA: Percutaneous PEG tube placement.

EXAM:
PORTABLE CHEST 1 VIEW

[chest ap]
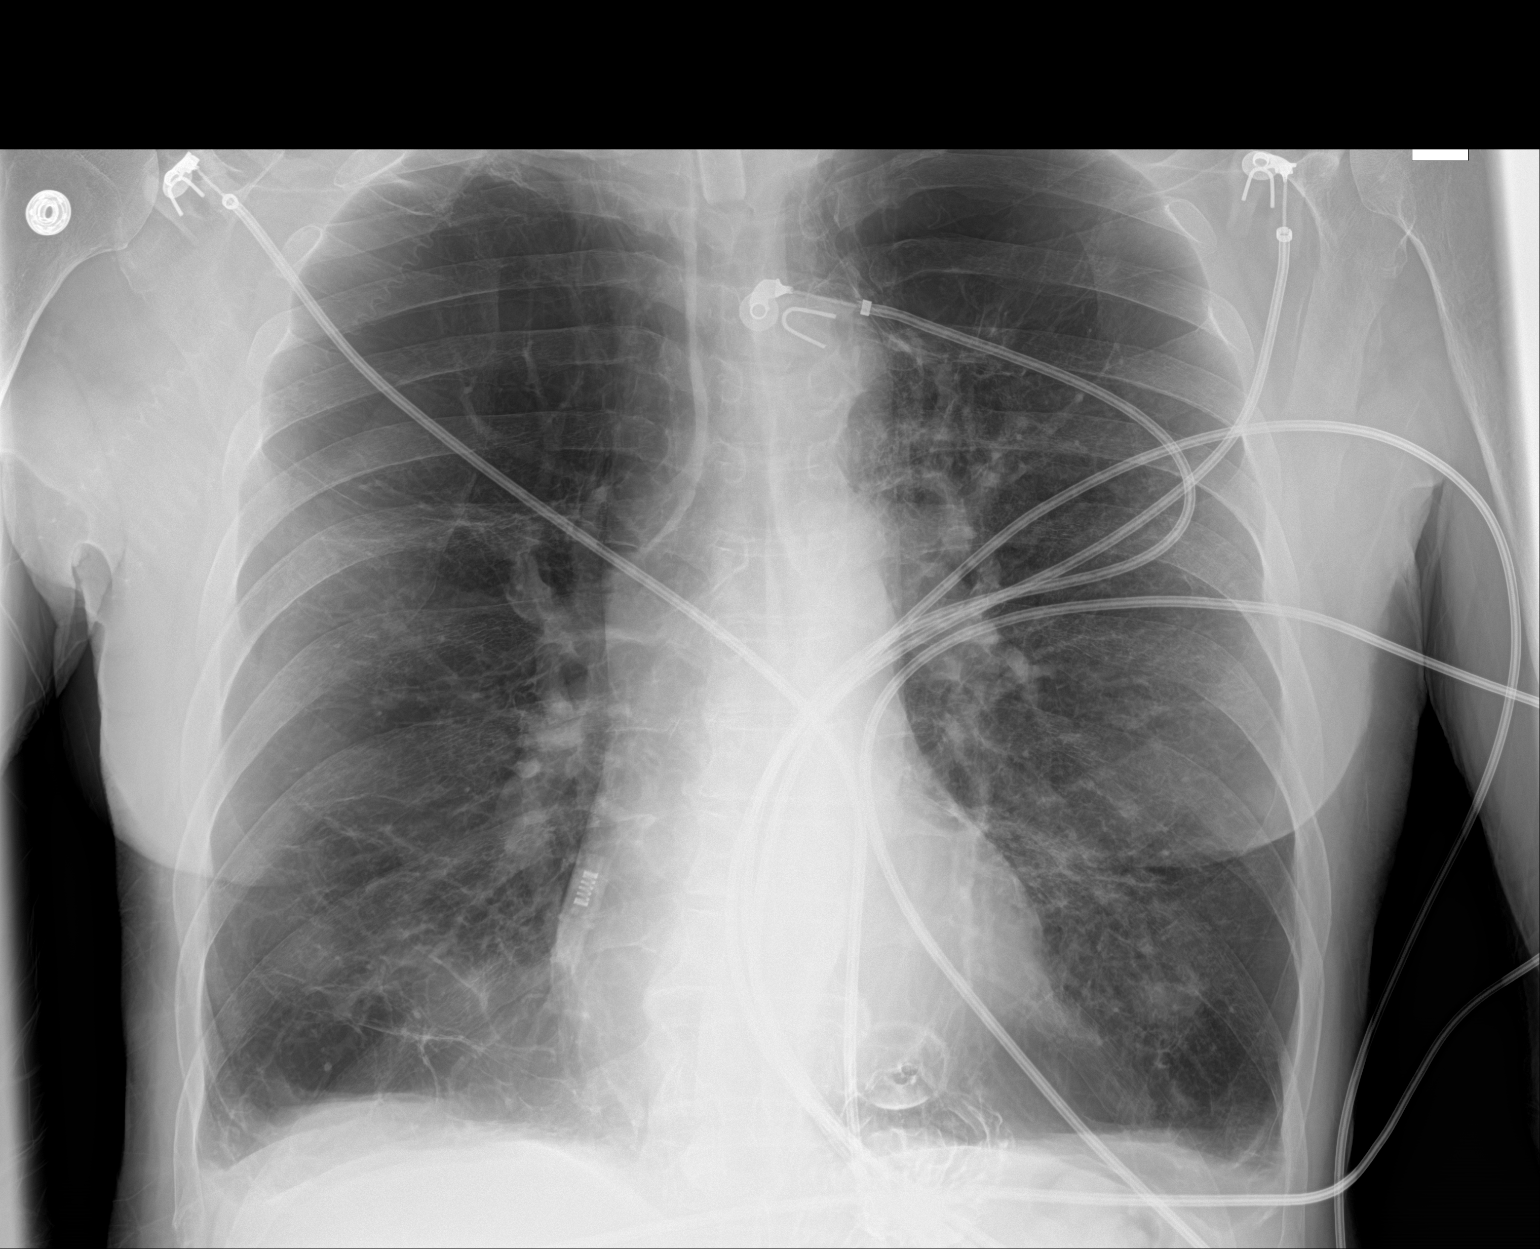

[1 of 1 positions shown; findings below may reference images not displayed]

FINDINGS: The tracheostomy tube terminates in the trachea. There is a
prominent skin fold over the lateral right chest with lung markings
on both sides. Emphysematous changes in the lungs. No change in the
cardiomediastinal silhouette. No focal infiltrate. No other acute
abnormalities. Small hiatal hernia.
IMPRESSION: Support apparatus as above. Emphysematous. No acute interval change.

## 2020-11-29 IMAGING — DX ABDOMEN - 1 VIEW
1 series · 1 of 1 positions shown · non-contrast
Comparison: None.

CLINICAL DATA: Peg tube placement.

EXAM:
ABDOMEN - 1 VIEW

[abdomen kub]
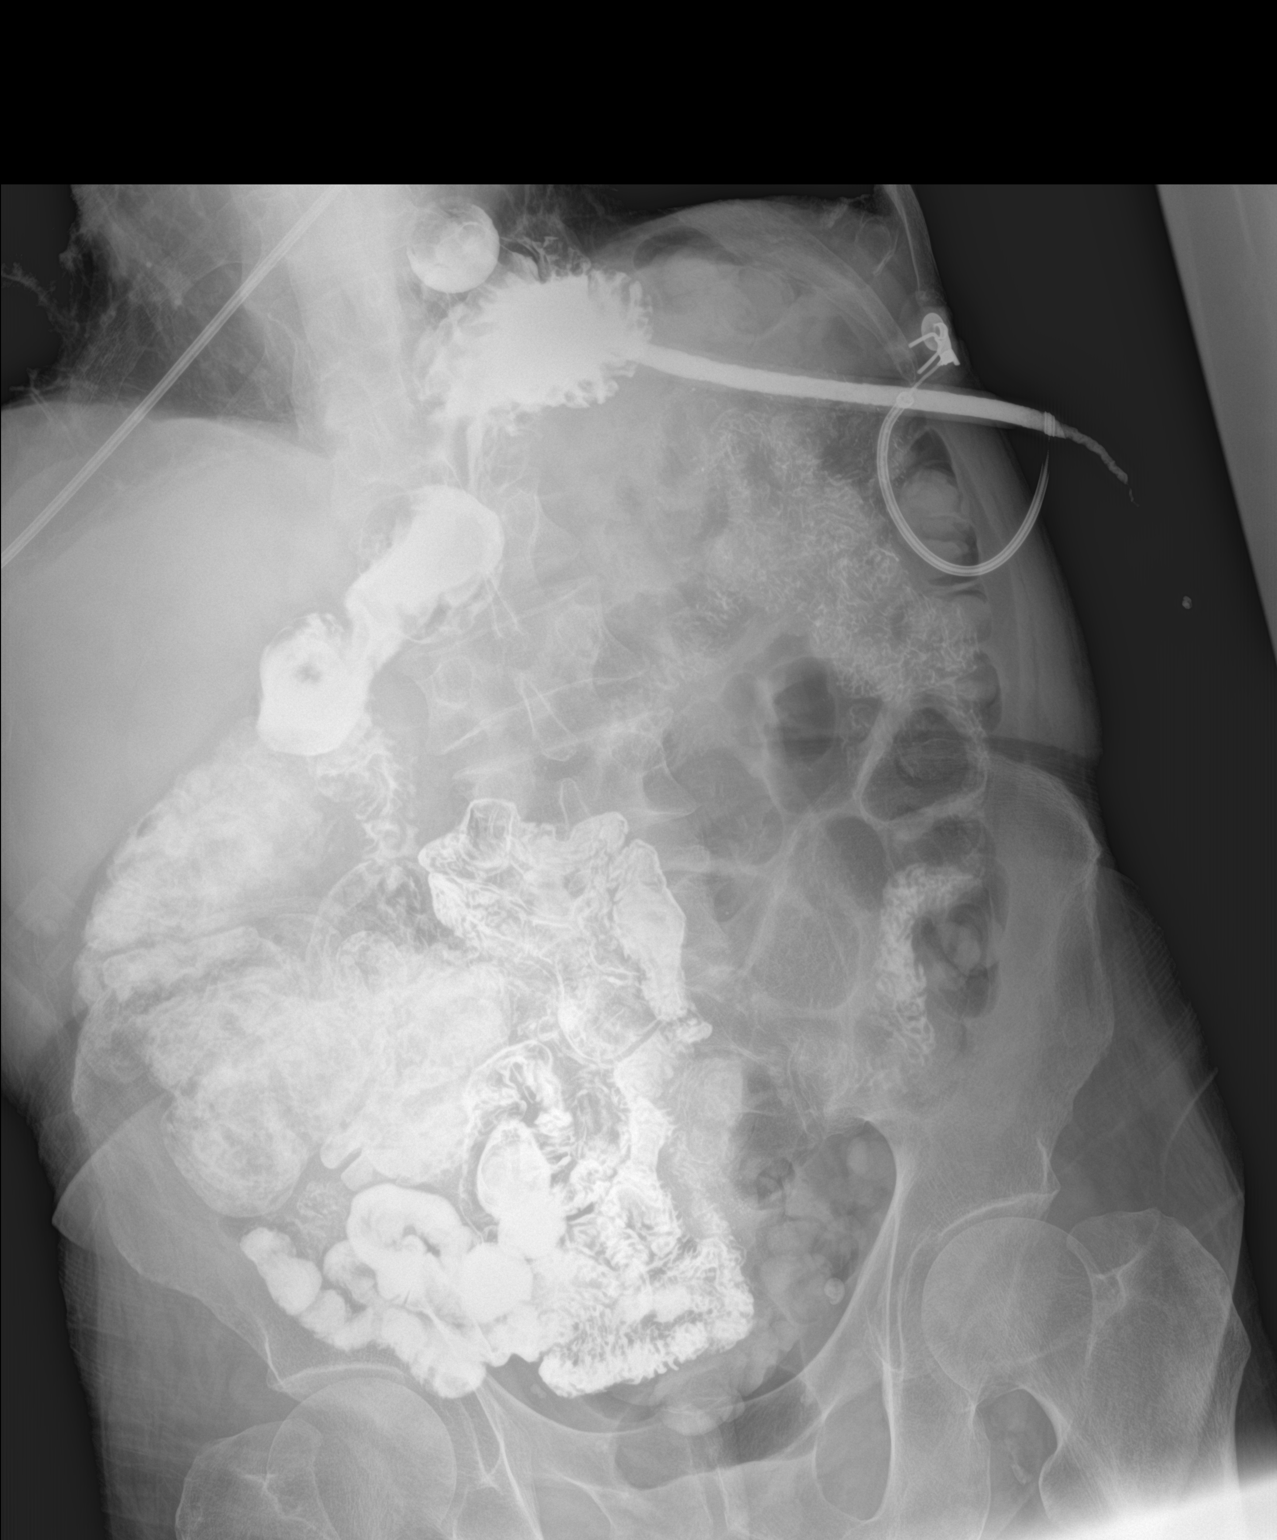

[1 of 1 positions shown; findings below may reference images not displayed]

FINDINGS: The PEG tube projects over the medial left lower chest, likely
within the portion of stomach extending above the diaphragm. No leak
around the PEG tube bulb. Contrast extends into the small bowel.
IMPRESSION: The PEG tube projects over the medial left lower chest, probably
within a portion of stomach extending above the diaphragm. No
evidence of leak. Contrast seen in the small bowel.

## 2022-01-29 DIAGNOSIS — Z9911 Dependence on respirator [ventilator] status: Secondary | ICD-10-CM

## 2022-01-29 DIAGNOSIS — F411 Generalized anxiety disorder: Secondary | ICD-10-CM | POA: Diagnosis not present

## 2022-01-29 DIAGNOSIS — K219 Gastro-esophageal reflux disease without esophagitis: Secondary | ICD-10-CM | POA: Diagnosis not present

## 2022-01-29 DIAGNOSIS — J9621 Acute and chronic respiratory failure with hypoxia: Secondary | ICD-10-CM | POA: Diagnosis not present

## 2022-01-29 DIAGNOSIS — J441 Chronic obstructive pulmonary disease with (acute) exacerbation: Secondary | ICD-10-CM | POA: Diagnosis not present

## 2022-01-30 DIAGNOSIS — K219 Gastro-esophageal reflux disease without esophagitis: Secondary | ICD-10-CM

## 2022-01-30 DIAGNOSIS — J441 Chronic obstructive pulmonary disease with (acute) exacerbation: Secondary | ICD-10-CM

## 2022-01-30 DIAGNOSIS — Z9911 Dependence on respirator [ventilator] status: Secondary | ICD-10-CM

## 2022-01-30 DIAGNOSIS — F411 Generalized anxiety disorder: Secondary | ICD-10-CM

## 2022-01-30 DIAGNOSIS — J9621 Acute and chronic respiratory failure with hypoxia: Secondary | ICD-10-CM

## 2022-01-31 DIAGNOSIS — J441 Chronic obstructive pulmonary disease with (acute) exacerbation: Secondary | ICD-10-CM | POA: Diagnosis not present

## 2022-01-31 DIAGNOSIS — K219 Gastro-esophageal reflux disease without esophagitis: Secondary | ICD-10-CM | POA: Diagnosis not present

## 2022-01-31 DIAGNOSIS — Z9911 Dependence on respirator [ventilator] status: Secondary | ICD-10-CM

## 2022-01-31 DIAGNOSIS — F411 Generalized anxiety disorder: Secondary | ICD-10-CM | POA: Diagnosis not present

## 2022-01-31 DIAGNOSIS — J9621 Acute and chronic respiratory failure with hypoxia: Secondary | ICD-10-CM | POA: Diagnosis not present

## 2022-02-01 DIAGNOSIS — F411 Generalized anxiety disorder: Secondary | ICD-10-CM

## 2022-02-01 DIAGNOSIS — K219 Gastro-esophageal reflux disease without esophagitis: Secondary | ICD-10-CM

## 2022-02-01 DIAGNOSIS — J441 Chronic obstructive pulmonary disease with (acute) exacerbation: Secondary | ICD-10-CM

## 2022-02-01 DIAGNOSIS — Z9911 Dependence on respirator [ventilator] status: Secondary | ICD-10-CM

## 2022-02-01 DIAGNOSIS — J9621 Acute and chronic respiratory failure with hypoxia: Secondary | ICD-10-CM

## 2022-02-02 DIAGNOSIS — J9621 Acute and chronic respiratory failure with hypoxia: Secondary | ICD-10-CM

## 2022-02-02 DIAGNOSIS — Z9911 Dependence on respirator [ventilator] status: Secondary | ICD-10-CM

## 2022-02-02 DIAGNOSIS — K219 Gastro-esophageal reflux disease without esophagitis: Secondary | ICD-10-CM

## 2022-02-02 DIAGNOSIS — F411 Generalized anxiety disorder: Secondary | ICD-10-CM

## 2022-02-02 DIAGNOSIS — J441 Chronic obstructive pulmonary disease with (acute) exacerbation: Secondary | ICD-10-CM

## 2022-02-03 DIAGNOSIS — F411 Generalized anxiety disorder: Secondary | ICD-10-CM

## 2022-02-03 DIAGNOSIS — J441 Chronic obstructive pulmonary disease with (acute) exacerbation: Secondary | ICD-10-CM

## 2022-02-03 DIAGNOSIS — K219 Gastro-esophageal reflux disease without esophagitis: Secondary | ICD-10-CM

## 2022-02-03 DIAGNOSIS — Z9911 Dependence on respirator [ventilator] status: Secondary | ICD-10-CM

## 2022-02-03 DIAGNOSIS — J9621 Acute and chronic respiratory failure with hypoxia: Secondary | ICD-10-CM

## 2022-02-04 DIAGNOSIS — J441 Chronic obstructive pulmonary disease with (acute) exacerbation: Secondary | ICD-10-CM

## 2022-02-04 DIAGNOSIS — K219 Gastro-esophageal reflux disease without esophagitis: Secondary | ICD-10-CM

## 2022-02-04 DIAGNOSIS — Z9911 Dependence on respirator [ventilator] status: Secondary | ICD-10-CM

## 2022-02-04 DIAGNOSIS — J9621 Acute and chronic respiratory failure with hypoxia: Secondary | ICD-10-CM

## 2022-02-04 DIAGNOSIS — F411 Generalized anxiety disorder: Secondary | ICD-10-CM

## 2022-02-05 DIAGNOSIS — K219 Gastro-esophageal reflux disease without esophagitis: Secondary | ICD-10-CM

## 2022-02-05 DIAGNOSIS — J9621 Acute and chronic respiratory failure with hypoxia: Secondary | ICD-10-CM

## 2022-02-05 DIAGNOSIS — Z9911 Dependence on respirator [ventilator] status: Secondary | ICD-10-CM

## 2022-02-05 DIAGNOSIS — J441 Chronic obstructive pulmonary disease with (acute) exacerbation: Secondary | ICD-10-CM

## 2022-02-05 DIAGNOSIS — F411 Generalized anxiety disorder: Secondary | ICD-10-CM

## 2022-02-06 DIAGNOSIS — K219 Gastro-esophageal reflux disease without esophagitis: Secondary | ICD-10-CM

## 2022-02-06 DIAGNOSIS — F411 Generalized anxiety disorder: Secondary | ICD-10-CM

## 2022-02-06 DIAGNOSIS — J441 Chronic obstructive pulmonary disease with (acute) exacerbation: Secondary | ICD-10-CM

## 2022-02-06 DIAGNOSIS — Z9911 Dependence on respirator [ventilator] status: Secondary | ICD-10-CM

## 2022-02-06 DIAGNOSIS — J9621 Acute and chronic respiratory failure with hypoxia: Secondary | ICD-10-CM

## 2022-02-07 DIAGNOSIS — J9621 Acute and chronic respiratory failure with hypoxia: Secondary | ICD-10-CM

## 2022-02-07 DIAGNOSIS — F411 Generalized anxiety disorder: Secondary | ICD-10-CM

## 2022-02-07 DIAGNOSIS — Z9911 Dependence on respirator [ventilator] status: Secondary | ICD-10-CM

## 2022-02-07 DIAGNOSIS — K219 Gastro-esophageal reflux disease without esophagitis: Secondary | ICD-10-CM

## 2022-02-07 DIAGNOSIS — J441 Chronic obstructive pulmonary disease with (acute) exacerbation: Secondary | ICD-10-CM

## 2022-02-08 DIAGNOSIS — J9621 Acute and chronic respiratory failure with hypoxia: Secondary | ICD-10-CM

## 2022-02-08 DIAGNOSIS — Z9911 Dependence on respirator [ventilator] status: Secondary | ICD-10-CM

## 2022-02-08 DIAGNOSIS — K219 Gastro-esophageal reflux disease without esophagitis: Secondary | ICD-10-CM

## 2022-02-08 DIAGNOSIS — J441 Chronic obstructive pulmonary disease with (acute) exacerbation: Secondary | ICD-10-CM

## 2022-02-08 DIAGNOSIS — F411 Generalized anxiety disorder: Secondary | ICD-10-CM

## 2022-02-16 DIAGNOSIS — F411 Generalized anxiety disorder: Secondary | ICD-10-CM | POA: Diagnosis not present

## 2022-02-16 DIAGNOSIS — K219 Gastro-esophageal reflux disease without esophagitis: Secondary | ICD-10-CM | POA: Diagnosis not present

## 2022-02-16 DIAGNOSIS — J9621 Acute and chronic respiratory failure with hypoxia: Secondary | ICD-10-CM | POA: Diagnosis not present

## 2022-02-16 DIAGNOSIS — Z9911 Dependence on respirator [ventilator] status: Secondary | ICD-10-CM

## 2022-02-16 DIAGNOSIS — J441 Chronic obstructive pulmonary disease with (acute) exacerbation: Secondary | ICD-10-CM | POA: Diagnosis not present

## 2022-02-17 DIAGNOSIS — F411 Generalized anxiety disorder: Secondary | ICD-10-CM | POA: Diagnosis not present

## 2022-02-17 DIAGNOSIS — J441 Chronic obstructive pulmonary disease with (acute) exacerbation: Secondary | ICD-10-CM | POA: Diagnosis not present

## 2022-02-17 DIAGNOSIS — Z9911 Dependence on respirator [ventilator] status: Secondary | ICD-10-CM

## 2022-02-17 DIAGNOSIS — K219 Gastro-esophageal reflux disease without esophagitis: Secondary | ICD-10-CM | POA: Diagnosis not present

## 2022-02-17 DIAGNOSIS — J9621 Acute and chronic respiratory failure with hypoxia: Secondary | ICD-10-CM | POA: Diagnosis not present

## 2022-02-18 DIAGNOSIS — J9621 Acute and chronic respiratory failure with hypoxia: Secondary | ICD-10-CM | POA: Diagnosis not present

## 2022-02-18 DIAGNOSIS — F411 Generalized anxiety disorder: Secondary | ICD-10-CM | POA: Diagnosis not present

## 2022-02-18 DIAGNOSIS — Z9911 Dependence on respirator [ventilator] status: Secondary | ICD-10-CM

## 2022-02-18 DIAGNOSIS — J441 Chronic obstructive pulmonary disease with (acute) exacerbation: Secondary | ICD-10-CM | POA: Diagnosis not present

## 2022-02-18 DIAGNOSIS — K219 Gastro-esophageal reflux disease without esophagitis: Secondary | ICD-10-CM | POA: Diagnosis not present

## 2022-09-04 DEATH — deceased
